# Patient Record
Sex: Male | Born: 1950
Health system: Southern US, Community
[De-identification: ages and names within clinical notes are randomized; demographics above are authoritative.]

## PROBLEM LIST (undated history)

## (undated) DIAGNOSIS — N200 Calculus of kidney: Secondary | ICD-10-CM

## (undated) DIAGNOSIS — E782 Mixed hyperlipidemia: Secondary | ICD-10-CM

## (undated) DIAGNOSIS — Z860100 Personal history of colon polyps, unspecified: Secondary | ICD-10-CM

## (undated) DIAGNOSIS — M545 Low back pain, unspecified: Secondary | ICD-10-CM

## (undated) DIAGNOSIS — I219 Acute myocardial infarction, unspecified: Secondary | ICD-10-CM

## (undated) DIAGNOSIS — I10 Essential (primary) hypertension: Secondary | ICD-10-CM

## (undated) DIAGNOSIS — R011 Cardiac murmur, unspecified: Secondary | ICD-10-CM

## (undated) DIAGNOSIS — I2510 Atherosclerotic heart disease of native coronary artery without angina pectoris: Secondary | ICD-10-CM

## (undated) DIAGNOSIS — E119 Type 2 diabetes mellitus without complications: Secondary | ICD-10-CM

## (undated) DIAGNOSIS — C8590 Non-Hodgkin lymphoma, unspecified, unspecified site: Secondary | ICD-10-CM

## (undated) DIAGNOSIS — Z87442 Personal history of urinary calculi: Secondary | ICD-10-CM

## (undated) DIAGNOSIS — M1990 Unspecified osteoarthritis, unspecified site: Secondary | ICD-10-CM

## (undated) HISTORY — DX: Mixed hyperlipidemia: E78.2

## (undated) HISTORY — PX: INGUINAL HERNIA REPAIR: SUR1180

## (undated) HISTORY — PX: CYSTOSTOMY W/ STENT INSERTION: SHX1434

## (undated) HISTORY — PX: CORONARY ANGIOPLASTY WITH STENT PLACEMENT: SHX49

## (undated) HISTORY — PX: CYSTOSCOPY W/ URETEROSCOPY W/ LITHOTRIPSY: SUR380

## (undated) HISTORY — DX: Low back pain, unspecified: M54.50

## (undated) HISTORY — DX: Personal history of colonic polyps: Z86.010

## (undated) HISTORY — PX: TONSILLECTOMY: SUR1361

## (undated) HISTORY — DX: Low back pain: M54.5

## (undated) HISTORY — DX: Personal history of colon polyps, unspecified: Z86.0100

## (undated) HISTORY — DX: Essential (primary) hypertension: I10

---

## 1990-05-22 HISTORY — PX: CORONARY ARTERY BYPASS GRAFT: SHX141

## 2000-05-22 DIAGNOSIS — C8590 Non-Hodgkin lymphoma, unspecified, unspecified site: Secondary | ICD-10-CM

## 2000-05-22 HISTORY — DX: Non-Hodgkin lymphoma, unspecified, unspecified site: C85.90

## 2000-08-07 ENCOUNTER — Encounter (INDEPENDENT_AMBULATORY_CARE_PROVIDER_SITE_OTHER): Payer: Self-pay | Admitting: *Deleted

## 2000-08-07 ENCOUNTER — Encounter (INDEPENDENT_AMBULATORY_CARE_PROVIDER_SITE_OTHER): Payer: Self-pay | Admitting: Specialist

## 2000-08-07 ENCOUNTER — Encounter: Payer: Self-pay | Admitting: General Surgery

## 2000-08-07 ENCOUNTER — Ambulatory Visit (HOSPITAL_COMMUNITY): Admission: RE | Admit: 2000-08-07 | Discharge: 2000-08-07 | Payer: Self-pay | Admitting: General Surgery

## 2000-08-09 ENCOUNTER — Encounter (INDEPENDENT_AMBULATORY_CARE_PROVIDER_SITE_OTHER): Payer: Self-pay | Admitting: *Deleted

## 2000-08-09 ENCOUNTER — Ambulatory Visit (HOSPITAL_COMMUNITY): Admission: RE | Admit: 2000-08-09 | Discharge: 2000-08-09 | Payer: Self-pay | Admitting: General Surgery

## 2000-08-09 ENCOUNTER — Encounter: Payer: Self-pay | Admitting: General Surgery

## 2000-08-10 ENCOUNTER — Encounter: Payer: Self-pay | Admitting: Cardiology

## 2000-08-10 ENCOUNTER — Ambulatory Visit (HOSPITAL_COMMUNITY): Admission: RE | Admit: 2000-08-10 | Discharge: 2000-08-10 | Payer: Self-pay | Admitting: Cardiology

## 2000-08-20 HISTORY — PX: EXPLORATORY LAPAROTOMY WITH ABDOMINAL MASS EXCISION: SHX5169

## 2000-08-20 HISTORY — PX: CHOLECYSTECTOMY: SHX55

## 2000-09-10 ENCOUNTER — Encounter (INDEPENDENT_AMBULATORY_CARE_PROVIDER_SITE_OTHER): Payer: Self-pay | Admitting: *Deleted

## 2000-09-10 ENCOUNTER — Encounter (INDEPENDENT_AMBULATORY_CARE_PROVIDER_SITE_OTHER): Payer: Self-pay | Admitting: Specialist

## 2000-09-10 ENCOUNTER — Encounter: Payer: Self-pay | Admitting: General Surgery

## 2000-09-10 ENCOUNTER — Inpatient Hospital Stay (HOSPITAL_COMMUNITY): Admission: RE | Admit: 2000-09-10 | Discharge: 2000-09-14 | Payer: Self-pay | Admitting: General Surgery

## 2000-09-13 ENCOUNTER — Encounter (INDEPENDENT_AMBULATORY_CARE_PROVIDER_SITE_OTHER): Payer: Self-pay | Admitting: *Deleted

## 2000-09-13 ENCOUNTER — Encounter: Payer: Self-pay | Admitting: Oncology

## 2004-08-30 ENCOUNTER — Ambulatory Visit: Payer: Self-pay | Admitting: Cardiology

## 2004-09-01 ENCOUNTER — Ambulatory Visit: Payer: Self-pay | Admitting: Cardiology

## 2004-09-08 ENCOUNTER — Ambulatory Visit: Payer: Self-pay | Admitting: Cardiology

## 2004-09-29 ENCOUNTER — Ambulatory Visit: Payer: Self-pay | Admitting: Cardiology

## 2004-10-11 ENCOUNTER — Ambulatory Visit: Payer: Self-pay | Admitting: Cardiology

## 2004-11-28 ENCOUNTER — Ambulatory Visit: Payer: Self-pay | Admitting: Cardiology

## 2005-09-22 ENCOUNTER — Ambulatory Visit: Payer: Self-pay | Admitting: Cardiology

## 2005-10-03 ENCOUNTER — Ambulatory Visit: Payer: Self-pay | Admitting: Cardiology

## 2010-06-13 ENCOUNTER — Ambulatory Visit
Admission: RE | Admit: 2010-06-13 | Discharge: 2010-06-13 | Payer: Self-pay | Source: Home / Self Care | Attending: Internal Medicine | Admitting: Internal Medicine

## 2010-06-16 ENCOUNTER — Encounter: Payer: Self-pay | Admitting: Internal Medicine

## 2010-06-23 NOTE — Letter (Signed)
Summary: New Patient letter  [facility]  [address] [place], [place] [address]   Phone: [phone]  Fax: [phone]       06/16/2010 MRN: [id number]  Ryan Vaughn [address] [place], [place]  [address]  Dear Mr. SWEEDEN,  Welcome to the Gastroenterology Division at [facility].    You are scheduled to see Dr.  Marina Goodell on 07-22-10 at 1:15pm on the 3rd floor at [facility], [address]  We ask that you try to arrive at our office 15 minutes prior to your appointment time to allow for check-in.  We would like you to complete the enclosed self-administered evaluation form prior to your visit and bring it with you on the day of your appointment.  We will review it with you.  Also, please bring a complete list of all your medications or, if you prefer, bring the medication bottles and we will list them.  Please bring your insurance card so that we may make a copy of it.  If your insurance requires a referral to see a specialist, please bring your referral form from your primary care physician.  Co-payments are due at the time of your visit and may be paid by cash, check or credit card.     Your office visit will consist of a consult with your physician (includes a physical exam), any laboratory testing he/she may order, scheduling of any necessary diagnostic testing (e.g. x-ray, ultrasound, CT-scan), and scheduling of a procedure (e.g. Endoscopy, Colonoscopy) if required.  Please allow enough time on your schedule to allow for any/all of these possibilities.    If you cannot keep your appointment, please call [phone] to cancel or reschedule prior to your appointment date.  This allows [place] the opportunity to schedule an appointment for another patient in need of care.  If you do not cancel or reschedule by 5 p.m. the business day prior to your appointment date, you will be charged a $50.00 late cancellation/no-show fee.    Thank you for choosing [facility] for your medical needs.  We appreciate the opportunity to care for you.  Please visit [place] at our website  to learn more about our practice.                     Sincerely,                                                             The Gastroenterology Division

## 2010-06-27 ENCOUNTER — Other Ambulatory Visit: Payer: 59 | Admitting: Internal Medicine

## 2010-07-22 ENCOUNTER — Ambulatory Visit: Payer: Self-pay | Admitting: Internal Medicine

## 2010-07-28 NOTE — Op Note (Signed)
Summary: Operative Report                    [place]. [facility]  Patient:    Ryan Vaughn, Ryan Vaughn                       MRN: [id number] Proc. Date: 09/10/00 Adm. Date:  [id number] Attending:  Brandy Hale CC:         Marcelino Duster, M.D., [place], [facility]  Julieanne Manson, M.D.   Operative Report  PREOPERATIVE DIAGNOSIS:  Abdominal mass, suspected lymphoma, gallstones.  POSTOPERATIVE DIAGNOSES: 1. Lymphoproliferative disorder of small bowel mesentery. 2. Suspect lymphoma. 3. Gallstones.  OPERATIONS PERFORMED: 1. Diagnostic laparoscopy converted to open laparotomy. 2. Excisional biopsy of mesenteric mass. 3. Frozen section. 4. Cholecystectomy.  SURGEON:  Claud Kelp, M.D.  ASSISTANT:  Ollen Gross. Vernell Morgans, M.D.  ANESTHESIA:  General endotracheal.  OPERATIVE INDICATIONS:  This is a 60 year old white man who was recently elevated for hematuria and back pain.  A CAT scan showed some calcified gallstones and a 9 cm enhancing mass in the left upper abdomen within the small bowel mesentery.  This was separate from the pancreas and the stomach, and the colon; and, involved the superior mesenteric artery.  He was also found to recently have a right ureteral kidney stone and has recently had a stent placed, and lithotripsy and the stent removed; and, the hematuria has resolved.  He underwent CT guided core biopsy of his abdominal mass on August 07, 2000 and this showed atypical lymphoid cells suspicious of lymphoma, but the diagnosis could not be confirmed.  The patient was advised to have surgery for diagnosis of his abdominal mass, despite the absence of symptoms and felt that cholecystectomy would be reasonable if the surgery went well.  OPERATIVE FINDINGS:   The patient had a 10 cm mass involving the very proximal small bowel mesentery.  This mass was grapefruit size and extended all the way back into the root of the mesentery around the superior mesenteric  artery. The mass did not invade the small bowel itself, but filled the mesentery in this area.  There was a 3 cm nodule adjacent to this, which was amenable to biopsy.  There was no other adenopathy in the small or large intestinal mesentery.  Celiac and periaortic areas felt normal.  Gallbladder was distended, but not acutely inflamed.  The liver looked and felt normal.  The stomach and duodenum looked normal.  The rest of the small bowel and large bowel contained no palpable masses.  The rectum and pelvis felt normal.  There was no ascites.  The omentum looked normal too.  OPERATIVE TECHNIQUE:  Following the induction of general endotracheal anesthesia a Foley catheter was inserted.  The abdomen was prepped and draped in a sterile fashion.  A short vertical incision was made just below the umbilicus.  The fascia was incised in the midline and the abdominal cavity entered under direct vision.  A 10 mm Hasson trocar was inserted and secured with a purse-string suture of 0-Vicryl.  Pneumoperitoneum was created.  Video camera was inserted with visualization and findings as described above.  We put a 5 mm trocar in the upper midline and explored the abdomen, moving the small and large intestines around.  This large mass was visible, but could not adequately mobilize it and it looked quite vascular, and we did not feel that we could control bleeding laparoscopically.  For this  reason we released the pneumoperitoneum and removed the trocars.  An upper midline laparotomy was performed, and the abdomen entered and further explored with findings as described above.  We mobilized the mass in the left upper quadrant and found a small 2.5 cm nodule in the small bowel mesentery adjacent to the larger mass.  We dissected this out the mesentery, isolating, clamping, and dividing feeding vessels. We ligated these feeding vessels with 2-0 silk ties.  We ultimately removed the entire mass and sent it to  pathology.  Dr. Laureen Ochs did a frozen section and felt that this was most likely a lymphoma, and that he would send it for further characterization.  Hemostasis was excellent in the small bowel mesentery.  The small bowel was pink and viable, and we did not devascularize this area at all.  We closed the mesentery with interrupted sutures of 2-0 silk.  We then turned our attention to the right upper quadrant.  The gallbladder was distended and the bile was evacuated with a needle and syringe.  We elevated the gallbladder after exposure was gained.  We dissected the gallbladder from its fundus downward, dissecting with scissors and cautery dissection until we had the gallbladder dissected completely away from the liver bed.  We then isolated the cystic artery, secured it with metal clips and divided it.  The gallbladder was then attached only by the cystic duct, which was easily visible.   The cystic duct was secured with metal clips and divided.  The gallbladder was removed.  The operative field in the right upper quadrant was copiously irrigated with saline.  Hemostasis was excellent and achieved with electrocautery.  At the completion of the case there was no bleeding and no bile leak whatsoever in the right upper quadrant and there was no bleeding from the small bowel mesentery.  We irrigated the abdomen one more time and returned the GI tract and the omentum to their anatomic positions.  Midline fascia was closed with a running suture of #1 PDS and the skin closed with skin staples.  Clean bandage was placed and the patient taken to the recovery room in stable condition.  ESTIMATED BLOOD LOSS:  About 100-150 cc.  COMPLICATIONS:  None.  Sponge, needle and instrument counts were correct. DD:  09/10/00 TD:  09/11/00 Job: [id number]

## 2010-07-28 NOTE — Discharge Summary (Signed)
Summary: Discharge Summary                    [place]. [facility]  Patient:    Ryan Vaughn, Ryan Vaughn                       MRN: [id number] Adm. Date:  [id number] Disc. Date: [id number] Attending:  Brandy Hale CC:         Marcelino Duster, M.D., [place], [place].  Julieanne Manson, M.D.  Pierce Crane, M.D.   Discharge Summary  FINAL DIAGNOSES: 1. 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 2. Chronic cholecystitis with cholelithiasis.  PROCEDURES: 1. Diagnostic laparoscopy converted to open laparotomy, excisional biopsy    of mesenteric mass, cholecystectomy September 10, 2000. 2. Bone marrow biopsy and aspiration, September 14, 2000.  HISTORY OF PRESENT ILLNESS:  This is a 60 year old white man who underwent recent evaluation for hematuria and back pain.  A CT scan showed calcified gallstones and a 9 cm mass in the left upper abdomen within the small bowel mesentery.  This was thought to be separate from the pancreas and stomach, possibly involving the superior mesenteric artery.  He also had a right urethral kidney stone which was treated with lithotripsy.  He underwent CT-guided core biopsy of his abdominal mass on August 07, 2000 and this showed atypical lymphoid cells suspicious but not diagnostic of lymphoma. The patient had no peripheral adenopathy.  He was advised to have surgery for diagnosis of his abdominal mass.  Gallstones were also noted and cholecystectomy was advised if his other surgery went well.  He was brought to the operating room electively and the hospital electively.  PHYSICAL EXAMINATION:  GENERAL:  Healthy, middle-aged gentleman in no distress.  HEENT:  Sclerae clear.  NECK:  No adenopathy and no mass.  LUNGS:  Clear.  HEART:  Regular rate and rhythm.  No murmur.  Well-healed sternotomy scar from previous coronary artery bypass grafting.  ABDOMEN:  Soft and nontender.  No palpable mass.  GENITALIA:  No inguinal adenopathy.  No testicular  mass.  HOSPITAL COURSE:  On the day of admission, the patient was taken to the operating room.  He underwent diagnostic laparoscopy.  We could see a large mass in the small bowel mesentery but it was felt to be very vascular and there were no smaller nodes that could be biopsied laparoscopically.  We saw no other abnormality of the liver, spleen, or omentum.  Converted to open laparotomy and we excised a 2 cm diameter nodule in the small bowel mesentery near the larger nodule.  We thoroughly explored the rest of the abdomen and found no other abnormalities.  Cholecystectomy was performed.  Postoperatively, the patient did fairly well.  He had some low grade fever in the early postoperative secondary to atelectasis but this resolved with mobilization and pulmonary toilet.  Pathology report showed a follicular lymphoma and chronic cholecystitis with cholelithiasis.  I asked Dr. Pierce Crane to see the patient and he did see the patient on September 13, 2000.  He advised the patient to undergo bone marrow biopsy and that was done on September 14, 2000 without any problems.  The patient was told that he would probably need combination of chemotherapy at least six cycles.  A discussion over the follow-up was made and Dr. Pierce Crane felt this patient could be followed in [place], [place] where he is from.  The patient progressed in his diet and activities  uneventfully and was discharged on September 14, 2000 after his bone marrow biopsy was done.  At the time of discharge, he was afebrile.  His wound was healing uneventfully.  He was tolerating a diet and had one bowel movement and passing lots of flatus. His exam was unremarkable.  DISCHARGE INSTRUCTIONS:  He was asked to follow up with me in the office in four to six days for staple removal.  He was given a prescription for Vicodin. DD:  10/01/00 TD:  10/02/00 Job: [id number]

## 2010-07-28 NOTE — Consult Note (Signed)
Summary: Consultation                    [place]. [facility]  Patient:    IMRAN, NUON                         MRN: [id number] Proc. Date: 09/13/00 Attending:  Pierce Crane, M.D. Dictator:   Lorette Ang, N.P. CC:         Marcelino Duster, M.D., [place], [place]  Julieanne Manson, M.D.  Angelia Mould. Derrell Lolling, M.D.  [facility], [place], [place]   Consultation Report  REASON FOR CONSULTATION:  New diagnosis of lymphoma.  REFERRING:  Angelia Mould. Derrell Lolling, M.D.  HISTORY OF PRESENT ILLNESS:  Mr. Whetstine is a 60 year old man with a history of CAD, status post CABG approximately 10 years ago and a PTCA with stent placement approximately four years ago, who developed some low-back pain and hematuria in late February 2002.  He was sent for an abdominal CT at [facility] by his primary care provider with findings of calculus involving the proximal left ureter, as well as findings of gallstones and a large soft tissue mass in the base of the mesentery measuring 6 x 8 cm.  Prior to further evaluation of this mass, Mr. Enberg required admission to [facility] with severe back pain, nausea, and vomiting.  He underwent a left ureteral stent placement, lithotripsy, and stent removal with resolution of his symptoms.  He was referred to Dr. Claud Kelp in [place] for evaluation of the abdominal mass and underwent a CT-guided biopsy of the mass on August 07, 2000.  The pathology report was indeterminate.  On September 08, 2000, he underwent open laparotomy with excisional biopsy of the mass and a cholecystectomy.  The final pathology ([id number]) revealed a large cell follicular lymphadenopathy; gallbladder with chronic cholecystitis and cholelithiasis.  PAST MEDICAL HISTORY: 1. CAD status post CABG, PTCA with stent placement [facility]). 2. Kidney stone, left ureteral stent placement, lithotripsy, stent removal in    March 2002. 3. Hypertension. 4. Hemorrhoids. 5.  Gallstones.  MEDICATIONS: 1. Aspirin 81 mg daily. 2. Tenormin 25 mg daily. 3. Protonix 40 mg daily. 4. Buprenex p.r.n. 5. Percocet p.r.n.  HOME MEDICATIONS: 1. Lipitor 40 mg daily. 2. Atenolol 25 mg daily. 3. Aspirin 81 mg daily. 4. Multivitamin.  ALLERGIES:  He has had a reaction in the past to MRI DYE.  FAMILY HISTORY:  Mother is in her 38s and has a history of breast cancer with a questionable brain metastasis; father is 32 with colon cancer and newly diagnosed liver metastasis; brother living, disabled in a mining accident; sister living, healthy; sister living, healthy.  SOCIAL HISTORY:  Mr. Hilleary lives in [place] by himself.  He is separated.  He has three daughters ages 55 and twins age 77, who are all healthy.  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  He has no history of tobacco or ETOH use.  REVIEW OF SYSTEMS:  The patient denies any weight loss or anorexia.  He has had no fever or night sweats.  He denies any fatigue.  He has had no unusual headaches or vision changes.  He denies any hearing deficits.  He has noted no enlarged lymph nodes.  He denies any shortness of breath but does report a cough for the past week.  He has had no hemoptysis.  He denies any chest pain or edema.  He has had no  recent change in his bowel habits and denies any rectal bleeding or melena.  He has had no hematuria since his lithotripsy and denies any dysuria.  He denies any falls or balance problems at home.  PHYSICAL EXAMINATION:  VITAL SIGNS:  Temperature 97.6, heart rate 72, respirations 16, blood pressure 115/67.  GENERAL:  Well-developed male in no acute distress.  HEENT:  Normocephalic, atraumatic.  Pupils are equal, round and reactive to light; extraocular movements are intact; sclerae are anicteric.  Oropharynx is clear.  NECK:  Supple.  LYMPHATIC:  No palpable lymph nodes.  LUNGS:  Diminished at the bilaterally; clear.  CARDIOVASCULAR:  Regular rate and  rhythm.  ABDOMEN:  Soft, tenderness secondary to recent surgery.  Midline incision with staples.  EXTREMITIES:  No edema or clubbing.  NEUROLOGIC:  Alert and oriented x 3.  Motor strength is 5/5.  DTRs are 2+ and symmetrical.  LABORATORY DATA:  Hemoglobin 15.4, white count 4.4, platelets 231,000.  Sodium 141, potassium 3.7, BUN 8, creatinine 1.1, glucose 97, total bilirubin 1.1, alkaline phosphatase 72, SGOT 28, SGPT 30, total protein 7.1, albumin 3.7, calcium 9.4, and LDH 152.  RADIOLOGY: 1. Chest x-ray September 10, 2000:  Borderline cardiac enlargement.  No active    disease. 2. CT of the abdomen September 10, 2000:  Liver negative; spleen negative.  Large,    enhancing mass involving the upper abdomen 9 x 7 cm.  Appears to be based    in the region of the left mesenteric space.  Gallbladder with gallstones. 3. Nuclear medicine Cardiolite:  Left ventricular ejection fraction 65%    (August 10, 2000).  IMPRESSION AND PLAN:  Mr. Domangue is a 60 year old man with an incidental finding of an abdominal mass during a workup for kidney stones.  He is now status post open laparotomy with biopsy confirming a large cell follicular lymphoma; CD20 positive.  He has had no B symptoms.  He will need a full staging workup including a CT scan of the chest, PET scan, and a bone marrow biopsy.  We will likely treat him with a combination CHOP/Rituxan chemotherapy for six cycles.  Mr. Wessinger would like to have follow-up at the [facility] clinic and we have scheduled him to see Dr. Donnie Coffin on Oct 01, 2000 at 4:30 p.m.  We will also schedule him for his PET scan. DD:  09/14/00 TD:  09/15/00 Job: [id number]

## 2010-10-07 NOTE — Procedures (Signed)
[place]. [facility]  Patient:    PARAS, KREIDER                       MRN: [id number] Proc. Date: 09/10/00 Adm. Date:  [id number] Attending:  Brandy Hale                           Procedure Report  PROCEDURE:  Epidural catheter placement for postoperative epidural fentanyl pain control.  DESCRIPTION OF PROCEDURE:  The procedure was not discussed with the patient preoperatively; however, the procedure was changed intraoperatively to an open cholecystectomy due to a large abdominal mass suggestive of lymphoma.  The surgeons felt that the patient would benefit from postoperative epidural fentanyl pain control and in consultation with his primary surgeon, Angelia Mould. Derrell Lolling, M.D., it was elected to place an epidural catheter while the patient was still asleep for postoperative epidural fentanyl and Marcaine pain control.  After the surgical dressing was applied, patient was turned to the full right lateral decubitus position.  Betadine prep x 3 was performed on the lumbar region.  Sterile technique was used.  A #17 gauge Tuohy needle was inserted in the L3-4 interspace using a midline approach and air loss of resistance.  The epidural space was located on the first pass.  There was no blood or CSF noted.  A nonstyletted catheter was then placed 6 cm into the epidural space, and the needle was removed.  There was negative aspiration of blood or CSF through the catheter.  A mixture of preservative-free Xylocaine 1% 5 cc, 0.9 normal saline 10 cc, and 100 mcg of fentanyl was then slowly injected into the epidural catheter.  The catheter was secured to the patients back using four-inch Hypafix dressing over a 2 x 2 sterile gauze at the insertion site.  The patient was then awoken, extubated, taken to the recovery room in good condition, where he was placed on a continuous infusion of 5 mcg/cc epidural fentanyl and one-sixteenth percent Marcaine, initial  rate of 16 cc/hr.  He will be evaluated for the next two to three days for adequacy of pain control.  There were no complications during the procedure. DD:  09/10/00 TD:  09/11/00 Job: [id number]

## 2010-10-07 NOTE — Consult Note (Signed)
[place]. [facility]  Patient:    Ryan Vaughn, Ryan Vaughn                         MRN: [id number] Proc. Date: 09/13/00 Attending:  Pierce Crane, M.D. Dictator:   Lorette Ang, N.P. CC:         Marcelino Duster, M.D., [place], [place]  Julieanne Manson, M.D.  Angelia Mould. Derrell Lolling, M.D.  [facility], [place], [place]   Consultation Report  REASON FOR CONSULTATION:  New diagnosis of lymphoma.  REFERRING:  Angelia Mould. Derrell Lolling, M.D.  HISTORY OF PRESENT ILLNESS:  Mr. Eick is a 60 year old man with a history of CAD, status post CABG approximately 10 years ago and a PTCA with stent placement approximately four years ago, who developed some low-back pain and hematuria in late February 2002.  He was sent for an abdominal CT at [facility] by his primary care Donta Fuster with findings of calculus involving the proximal left ureter, as well as findings of gallstones and a large soft tissue mass in the base of the mesentery measuring 6 x 8 cm.  Prior to further evaluation of this mass, Mr. Urieta required admission to [facility] with severe back pain, nausea, and vomiting.  He underwent a left ureteral stent placement, lithotripsy, and stent removal with resolution of his symptoms.  He was referred to Dr. Claud Kelp in [place] for evaluation of the abdominal mass and underwent a CT-guided biopsy of the mass on August 07, 2000.  The pathology report was indeterminate.  On September 08, 2000, he underwent open laparotomy with excisional biopsy of the mass and a cholecystectomy.  The final pathology ([id number]) revealed a large cell follicular lymphadenopathy; gallbladder with chronic cholecystitis and cholelithiasis.  PAST MEDICAL HISTORY: 1. CAD status post CABG, PTCA with stent placement [facility]). 2. Kidney stone, left ureteral stent placement, lithotripsy, stent removal in    March 2002. 3. Hypertension. 4. Hemorrhoids. 5.  Gallstones.  MEDICATIONS: 1. Aspirin 81 mg daily. 2. Tenormin 25 mg daily. 3. Protonix 40 mg daily. 4. Buprenex p.r.n. 5. Percocet p.r.n.  HOME MEDICATIONS: 1. Lipitor 40 mg daily. 2. Atenolol 25 mg daily. 3. Aspirin 81 mg daily. 4. Multivitamin.  ALLERGIES:  He has had a reaction in the past to MRI DYE.  FAMILY HISTORY:  Mother is in her 26s and has a history of breast cancer with a questionable brain metastasis; father is 106 with colon cancer and newly diagnosed liver metastasis; brother living, disabled in a mining accident; sister living, healthy; sister living, healthy.  SOCIAL HISTORY:  Mr. Pooley lives in [place] by himself.  He is separated.  He has three daughters ages 50 and twins age 68, who are all healthy.  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  He has no history of tobacco or ETOH use.  REVIEW OF SYSTEMS:  The patient denies any weight loss or anorexia.  He has had no fever or night sweats.  He denies any fatigue.  He has had no unusual headaches or vision changes.  He denies any hearing deficits.  He has noted no enlarged lymph nodes.  He denies any shortness of breath but does report a cough for the past week.  He has had no hemoptysis.  He denies any chest pain or edema.  He has had no recent change in his bowel habits and denies any rectal bleeding or melena.  He has had no hematuria since his  lithotripsy and denies any dysuria.  He denies any falls or balance problems at home.  PHYSICAL EXAMINATION:  VITAL SIGNS:  Temperature 97.6, heart rate 72, respirations 16, blood pressure 115/67.  GENERAL:  Well-developed male in no acute distress.  HEENT:  Normocephalic, atraumatic.  Pupils are equal, round and reactive to light; extraocular movements are intact; sclerae are anicteric.  Oropharynx is clear.  NECK:  Supple.  LYMPHATIC:  No palpable lymph nodes.  LUNGS:  Diminished at the bilaterally; clear.  CARDIOVASCULAR:  Regular rate and  rhythm.  ABDOMEN:  Soft, tenderness secondary to recent surgery.  Midline incision with staples.  EXTREMITIES:  No edema or clubbing.  NEUROLOGIC:  Alert and oriented x 3.  Motor strength is 5/5.  DTRs are 2+ and symmetrical.  LABORATORY DATA:  Hemoglobin 15.4, white count 4.4, platelets 231,000.  Sodium 141, potassium 3.7, BUN 8, creatinine 1.1, glucose 97, total bilirubin 1.1, alkaline phosphatase 72, SGOT 28, SGPT 30, total protein 7.1, albumin 3.7, calcium 9.4, and LDH 152.  RADIOLOGY: 1. Chest x-ray September 10, 2000:  Borderline cardiac enlargement.  No active    disease. 2. CT of the abdomen September 10, 2000:  Liver negative; spleen negative.  Large,    enhancing mass involving the upper abdomen 9 x 7 cm.  Appears to be based    in the region of the left mesenteric space.  Gallbladder with gallstones. 3. Nuclear medicine Cardiolite:  Left ventricular ejection fraction 65%    (August 10, 2000).  IMPRESSION AND PLAN:  Mr. Clinkscale is a 60 year old man with an incidental finding of an abdominal mass during a workup for kidney stones.  He is now status post open laparotomy with biopsy confirming a large cell follicular lymphoma; CD20 positive.  He has had no B symptoms.  He will need a full staging workup including a CT scan of the chest, PET scan, and a bone marrow biopsy.  We will likely treat him with a combination CHOP/Rituxan chemotherapy for six cycles.  Mr. Bright would like to have follow-up at the [facility] clinic and we have scheduled him to see Dr. Donnie Coffin on Oct 01, 2000 at 4:30 p.m.  We will also schedule him for his PET scan. DD:  09/14/00 TD:  09/15/00 Job: [id number]

## 2010-10-07 NOTE — Discharge Summary (Signed)
[place]. [facility]  Patient:    Ryan Vaughn, Ryan Vaughn                       MRN: [id number] Adm. Date:  [id number] Disc. Date: [id number] Attending:  Brandy Hale CC:         Marcelino Duster, M.D., [place], [place].  Julieanne Manson, M.D.  Pierce Crane, M.D.   Discharge Summary  FINAL DIAGNOSES: 1. 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 2. Chronic cholecystitis with cholelithiasis.  PROCEDURES: 1. Diagnostic laparoscopy converted to open laparotomy, excisional biopsy    of mesenteric mass, cholecystectomy September 10, 2000. 2. Bone marrow biopsy and aspiration, September 14, 2000.  HISTORY OF PRESENT ILLNESS:  This is a 60 year old white man who underwent recent evaluation for hematuria and back pain.  A CT scan showed calcified gallstones and a 9 cm mass in the left upper abdomen within the small bowel mesentery.  This was thought to be separate from the pancreas and stomach, possibly involving the superior mesenteric artery.  He also had a right urethral kidney stone which was treated with lithotripsy.  He underwent CT-guided core biopsy of his abdominal mass on August 07, 2000 and this showed atypical lymphoid cells suspicious but not diagnostic of lymphoma. The patient had no peripheral adenopathy.  He was advised to have surgery for diagnosis of his abdominal mass.  Gallstones were also noted and cholecystectomy was advised if his other surgery went well.  He was brought to the operating room electively and the hospital electively.  PHYSICAL EXAMINATION:  GENERAL:  Healthy, middle-aged gentleman in no distress.  HEENT:  Sclerae clear.  NECK:  No adenopathy and no mass.  LUNGS:  Clear.  HEART:  Regular rate and rhythm.  No murmur.  Well-healed sternotomy scar from previous coronary artery bypass grafting.  ABDOMEN:  Soft and nontender.  No palpable mass.  GENITALIA:  No inguinal adenopathy.  No testicular mass.  HOSPITAL COURSE:  On  the day of admission, the patient was taken to the operating room.  He underwent diagnostic laparoscopy.  We could see a large mass in the small bowel mesentery but it was felt to be very vascular and there were no smaller nodes that could be biopsied laparoscopically.  We saw no other abnormality of the liver, spleen, or omentum.  Converted to open laparotomy and we excised a 2 cm diameter nodule in the small bowel mesentery near the larger nodule.  We thoroughly explored the rest of the abdomen and found no other abnormalities.  Cholecystectomy was performed.  Postoperatively, the patient did fairly well.  He had some low grade fever in the early postoperative secondary to atelectasis but this resolved with mobilization and pulmonary toilet.  Pathology report showed a follicular lymphoma and chronic cholecystitis with cholelithiasis.  I asked Dr. Pierce Crane to see the patient and he did see the patient on September 13, 2000.  He advised the patient to undergo bone marrow biopsy and that was done on September 14, 2000 without any problems.  The patient was told that he would probably need combination of chemotherapy at least six cycles.  A discussion over the follow-up was made and Dr. Pierce Crane felt this patient could be followed in [place], [place] where he is from.  The patient progressed in his diet and activities uneventfully and was discharged on September 14, 2000 after his bone marrow biopsy was done.  At the time of discharge, he  was afebrile.  His wound was healing uneventfully.  He was tolerating a diet and had one bowel movement and passing lots of flatus. His exam was unremarkable.  DISCHARGE INSTRUCTIONS:  He was asked to follow up with me in the office in four to six days for staple removal.  He was given a prescription for Vicodin. DD:  10/01/00 TD:  10/02/00 Job: [id number]

## 2010-10-07 NOTE — Op Note (Signed)
[place]. [facility]  Patient:    Ryan Vaughn, Ryan Vaughn                       MRN: [id number] Proc. Date: 09/10/00 Adm. Date:  [id number] Attending:  Brandy Hale CC:         Marcelino Duster, M.D., [place], [facility]  Julieanne Manson, M.D.   Operative Report  PREOPERATIVE DIAGNOSIS:  Abdominal mass, suspected lymphoma, gallstones.  POSTOPERATIVE DIAGNOSES: 1. Lymphoproliferative disorder of small bowel mesentery. 2. Suspect lymphoma. 3. Gallstones.  OPERATIONS PERFORMED: 1. Diagnostic laparoscopy converted to open laparotomy. 2. Excisional biopsy of mesenteric mass. 3. Frozen section. 4. Cholecystectomy.  SURGEON:  Claud Kelp, M.D.  ASSISTANT:  Ollen Gross. Vernell Morgans, M.D.  ANESTHESIA:  General endotracheal.  OPERATIVE INDICATIONS:  This is a 60 year old white man who was recently elevated for hematuria and back pain.  A CAT scan showed some calcified gallstones and a 9 cm enhancing mass in the left upper abdomen within the small bowel mesentery.  This was separate from the pancreas and the stomach, and the colon; and, involved the superior mesenteric artery.  He was also found to recently have a right ureteral kidney stone and has recently had a stent placed, and lithotripsy and the stent removed; and, the hematuria has resolved.  He underwent CT guided core biopsy of his abdominal mass on August 07, 2000 and this showed atypical lymphoid cells suspicious of lymphoma, but the diagnosis could not be confirmed.  The patient was advised to have surgery for diagnosis of his abdominal mass, despite the absence of symptoms and felt that cholecystectomy would be reasonable if the surgery went well.  OPERATIVE FINDINGS:   The patient had a 10 cm mass involving the very proximal small bowel mesentery.  This mass was grapefruit size and extended all the way back into the root of the mesentery around the superior mesenteric artery. The mass did not  invade the small bowel itself, but filled the mesentery in this area.  There was a 3 cm nodule adjacent to this, which was amenable to biopsy.  There was no other adenopathy in the small or large intestinal mesentery.  Celiac and periaortic areas felt normal.  Gallbladder was distended, but not acutely inflamed.  The liver looked and felt normal.  The stomach and duodenum looked normal.  The rest of the small bowel and large bowel contained no palpable masses.  The rectum and pelvis felt normal.  There was no ascites.  The omentum looked normal too.  OPERATIVE TECHNIQUE:  Following the induction of general endotracheal anesthesia a Foley catheter was inserted.  The abdomen was prepped and draped in a sterile fashion.  A short vertical incision was made just below the umbilicus.  The fascia was incised in the midline and the abdominal cavity entered under direct vision.  A 10 mm Hasson trocar was inserted and secured with a purse-string suture of 0-Vicryl.  Pneumoperitoneum was created.  Video camera was inserted with visualization and findings as described above.  We put a 5 mm trocar in the upper midline and explored the abdomen, moving the small and large intestines around.  This large mass was visible, but could not adequately mobilize it and it looked quite vascular, and we did not feel that we could control bleeding laparoscopically.  For this reason we released the pneumoperitoneum and removed the trocars.  An upper midline laparotomy was performed, and the abdomen entered and further  explored with findings as described above.  We mobilized the mass in the left upper quadrant and found a small 2.5 cm nodule in the small bowel mesentery adjacent to the larger mass.  We dissected this out the mesentery, isolating, clamping, and dividing feeding vessels. We ligated these feeding vessels with 2-0 silk ties.  We ultimately removed the entire mass and sent it to pathology.  Dr. Laureen Ochs did  a frozen section and felt that this was most likely a lymphoma, and that he would send it for further characterization.  Hemostasis was excellent in the small bowel mesentery.  The small bowel was pink and viable, and we did not devascularize this area at all.  We closed the mesentery with interrupted sutures of 2-0 silk.  We then turned our attention to the right upper quadrant.  The gallbladder was distended and the bile was evacuated with a needle and syringe.  We elevated the gallbladder after exposure was gained.  We dissected the gallbladder from its fundus downward, dissecting with scissors and cautery dissection until we had the gallbladder dissected completely away from the liver bed.  We then isolated the cystic artery, secured it with metal clips and divided it.  The gallbladder was then attached only by the cystic duct, which was easily visible.   The cystic duct was secured with metal clips and divided.  The gallbladder was removed.  The operative field in the right upper quadrant was copiously irrigated with saline.  Hemostasis was excellent and achieved with electrocautery.  At the completion of the case there was no bleeding and no bile leak whatsoever in the right upper quadrant and there was no bleeding from the small bowel mesentery.  We irrigated the abdomen one more time and returned the GI tract and the omentum to their anatomic positions.  Midline fascia was closed with a running suture of #1 PDS and the skin closed with skin staples.  Clean bandage was placed and the patient taken to the recovery room in stable condition.  ESTIMATED BLOOD LOSS:  About 100-150 cc.  COMPLICATIONS:  None.  Sponge, needle and instrument counts were correct. DD:  09/10/00 TD:  09/11/00 Job: [id number]

## 2011-04-19 ENCOUNTER — Ambulatory Visit: Payer: Self-pay

## 2011-04-19 ENCOUNTER — Other Ambulatory Visit: Payer: Self-pay | Admitting: Occupational Medicine

## 2011-04-19 DIAGNOSIS — Z021 Encounter for pre-employment examination: Secondary | ICD-10-CM

## 2012-03-29 ENCOUNTER — Encounter (INDEPENDENT_AMBULATORY_CARE_PROVIDER_SITE_OTHER): Payer: 59

## 2012-03-29 DIAGNOSIS — I2510 Atherosclerotic heart disease of native coronary artery without angina pectoris: Secondary | ICD-10-CM

## 2012-03-29 DIAGNOSIS — Z951 Presence of aortocoronary bypass graft: Secondary | ICD-10-CM

## 2012-03-29 DIAGNOSIS — E785 Hyperlipidemia, unspecified: Secondary | ICD-10-CM

## 2012-03-29 DIAGNOSIS — Z87898 Personal history of other specified conditions: Secondary | ICD-10-CM

## 2012-04-16 ENCOUNTER — Ambulatory Visit: Payer: Self-pay | Admitting: Cardiovascular Disease

## 2012-05-16 ENCOUNTER — Encounter: Payer: Self-pay | Admitting: Cardiology

## 2012-06-11 ENCOUNTER — Ambulatory Visit: Payer: Self-pay | Admitting: Cardiology

## 2012-06-18 ENCOUNTER — Ambulatory Visit: Payer: Self-pay | Admitting: Cardiovascular Disease

## 2013-01-26 ENCOUNTER — Encounter (HOSPITAL_COMMUNITY): Payer: Self-pay

## 2013-01-26 ENCOUNTER — Emergency Department (HOSPITAL_COMMUNITY): Payer: Worker's Compensation

## 2013-01-26 ENCOUNTER — Emergency Department (HOSPITAL_COMMUNITY)
Admission: EM | Admit: 2013-01-26 | Discharge: 2013-01-26 | Disposition: A | Discharge status: Home / Self Care | Payer: Worker's Compensation | Attending: Emergency Medicine | Admitting: Emergency Medicine

## 2013-01-26 DIAGNOSIS — Y929 Unspecified place or not applicable: Secondary | ICD-10-CM | POA: Insufficient documentation

## 2013-01-26 DIAGNOSIS — Y9389 Activity, other specified: Secondary | ICD-10-CM | POA: Insufficient documentation

## 2013-01-26 DIAGNOSIS — Z8719 Personal history of other diseases of the digestive system: Secondary | ICD-10-CM | POA: Insufficient documentation

## 2013-01-26 DIAGNOSIS — E785 Hyperlipidemia, unspecified: Secondary | ICD-10-CM | POA: Insufficient documentation

## 2013-01-26 DIAGNOSIS — I10 Essential (primary) hypertension: Secondary | ICD-10-CM | POA: Insufficient documentation

## 2013-01-26 DIAGNOSIS — W278XXA Contact with other nonpowered hand tool, initial encounter: Secondary | ICD-10-CM | POA: Insufficient documentation

## 2013-01-26 DIAGNOSIS — Z7982 Long term (current) use of aspirin: Secondary | ICD-10-CM | POA: Insufficient documentation

## 2013-01-26 DIAGNOSIS — Z79899 Other long term (current) drug therapy: Secondary | ICD-10-CM | POA: Insufficient documentation

## 2013-01-26 DIAGNOSIS — S60222A Contusion of left hand, initial encounter: Secondary | ICD-10-CM

## 2013-01-26 DIAGNOSIS — S60229A Contusion of unspecified hand, initial encounter: Secondary | ICD-10-CM | POA: Insufficient documentation

## 2013-01-26 DIAGNOSIS — Z8739 Personal history of other diseases of the musculoskeletal system and connective tissue: Secondary | ICD-10-CM | POA: Insufficient documentation

## 2013-01-26 DIAGNOSIS — Z8679 Personal history of other diseases of the circulatory system: Secondary | ICD-10-CM | POA: Insufficient documentation

## 2013-01-26 MED ORDER — IBUPROFEN 800 MG PO TABS: 800.0000 mg | ORAL_TABLET | Freq: Three times a day (TID) | ORAL | Status: DC | PRN

## 2013-01-26 MED ORDER — HYDROCODONE-ACETAMINOPHEN 5-325 MG PO TABS
1.0000 | ORAL_TABLET | Freq: Once | ORAL | Status: AC
  Administered 2013-01-26: 1 via ORAL
  Filled 2013-01-26: qty 1

## 2013-01-26 MED ORDER — HYDROCODONE-ACETAMINOPHEN 5-325 MG PO TABS: 1.0000 | ORAL_TABLET | ORAL | Status: DC | PRN

## 2013-01-26 NOTE — ED Notes (Signed)
He states he inadvertently struck his left hand while working at home this morning with a "4 pound hammer".  He has edema and ecchymosis at lat. Post. Left hand.  He is in no distress and has no open wound(s).

## 2013-01-26 NOTE — ED Provider Notes (Signed)
Medical screening examination/treatment/procedure(s) were performed by non-physician practitioner and as supervising physician I was immediately available for consultation/collaboration.  Toy Baker, MD 01/26/13 2035

## 2013-01-26 NOTE — ED Provider Notes (Signed)
CSN: [id number]     Arrival date & time 01/26/13  0920 History   First MD Initiated Contact with Patient 01/26/13 0935     Chief Complaint  Patient presents with  . Hand Pain   (Consider location/radiation/quality/duration/timing/severity/associated sxs/prior Treatment) HPI Comments: Right handed patient accidentally hit his left hand with a 4lb sledge hammer this morning.  Denies weakness, numbness, break in skin.  Denies other injury.   Patient is a 62 y.o. male presenting with hand pain. The history is provided by the patient.  Hand Pain Associated symptoms include joint swelling. Pertinent negatives include no numbness or weakness.    Past Medical History  Diagnosis Date  . Essential hypertension, benign   . Mixed hyperlipidemia   . Acute coronary occlusion without mycocardial infarction   . Personal history of colonic polyps   . Lumbago   . Routine general medical examination at a health care facility    No past surgical history on file. No family history on file. History  Substance Use Topics  . Smoking status: Not on file  . Smokeless tobacco: Not on file  . Alcohol Use: Not on file    Review of Systems  Musculoskeletal: Positive for joint swelling.  Skin: Negative for wound.  Neurological: Negative for weakness and numbness.    Allergies  Ivp dye  Home Medications   Current Outpatient Rx  Name  Route  Sig  Dispense  Refill  . aspirin 81 MG tablet   Oral   Take 81 mg by mouth daily.         . clopidogrel (PLAVIX) 75 MG tablet   Oral   Take 75 mg by mouth daily.         . cyclobenzaprine (FLEXERIL) 10 MG tablet   Oral   Take 10 mg by mouth 3 (three) times daily as needed.         . metoprolol succinate (TOPROL-XL) 50 MG 24 hr tablet   Oral   Take 50 mg by mouth daily. Take with or immediately following a meal.         . rosuvastatin (CRESTOR) 20 MG tablet   Oral   Take 20 mg by mouth daily.          BP 125/82  Pulse 67  Temp(Src)  98.3 F (36.8 C) (Oral)  Resp 18  SpO2 98% Physical Exam  Nursing note and vitals reviewed. Constitutional: He appears well-developed and well-nourished. No distress.  HENT:  Head: Normocephalic and atraumatic.  Neck: Neck supple.  Pulmonary/Chest: Effort normal.  Musculoskeletal:       Right wrist: He exhibits normal range of motion, no tenderness and no bony tenderness.       Left hand: He exhibits tenderness and swelling. He exhibits normal range of motion, normal capillary refill and no laceration.       Hands: Neurological: He is alert.  Skin: He is not diaphoretic.    ED Course  Procedures (including critical care time) Labs Review Labs Reviewed - No data to display Imaging Review Dg Hand Complete Left  01/26/2013   *RADIOLOGY REPORT*  Clinical Data: Hit hand with hammer, now with pain involving the thumb and second finger  LEFT HAND - COMPLETE 3+ VIEW  Comparison: None.  Findings:  The provided anterior projection radiograph is degraded due to the fingers being held in a minimal degree of flexion.  No fracture or dislocation.  Joint spaces are preserved.  No erosions.  Regional soft tissues are  normal.  No radiopaque foreign body.  IMPRESSION: No fracture or radiopaque foreign body.   Original Report Authenticated By: Tacey Ruiz, MD    MDM   1. Hand contusion, left, initial encounter    Pt accidentally hit left hand with 4lb "mini" sledge hammer this morning while at work.  Neurovascularly intact.  Xray shows no fracture.  Placed in ACE bandage, d/c home with RICE instructions, norco for pain. Discussed all results with patient.  Pt given return precautions.  Pt verbalizes understanding and agrees with plan.      Trixie Dredge, PA-C 01/26/13 1007

## 2013-01-26 NOTE — ED Notes (Signed)
Pt hit lt hand with a hammer this am and has pian,

## 2013-01-26 NOTE — ED Notes (Signed)
Correction--he states this occurred at his workplace this morning.

## 2013-01-31 ENCOUNTER — Ambulatory Visit
Admission: RE | Admit: 2013-01-31 | Discharge: 2013-01-31 | Disposition: A | Discharge status: Home / Self Care | Payer: Worker's Compensation | Source: Ambulatory Visit | Attending: Occupational Medicine | Admitting: Occupational Medicine

## 2013-01-31 ENCOUNTER — Other Ambulatory Visit: Payer: Self-pay | Admitting: Occupational Medicine

## 2013-01-31 DIAGNOSIS — S6722XA Crushing injury of left hand, initial encounter: Secondary | ICD-10-CM

## 2013-02-06 ENCOUNTER — Ambulatory Visit (INDEPENDENT_AMBULATORY_CARE_PROVIDER_SITE_OTHER): Payer: BC Managed Care – PPO | Admitting: Cardiovascular Disease

## 2013-02-06 ENCOUNTER — Encounter: Payer: Self-pay | Admitting: Cardiovascular Disease

## 2013-02-06 ENCOUNTER — Encounter: Payer: Self-pay | Admitting: *Deleted

## 2013-02-06 VITALS — BP 103/68 | HR 56 | Ht 71.0 in | Wt 233.0 lb

## 2013-02-06 DIAGNOSIS — K635 Polyp of colon: Secondary | ICD-10-CM | POA: Insufficient documentation

## 2013-02-06 DIAGNOSIS — I25810 Atherosclerosis of coronary artery bypass graft(s) without angina pectoris: Secondary | ICD-10-CM

## 2013-02-06 DIAGNOSIS — I10 Essential (primary) hypertension: Secondary | ICD-10-CM | POA: Insufficient documentation

## 2013-02-06 DIAGNOSIS — Z01810 Encounter for preprocedural cardiovascular examination: Secondary | ICD-10-CM

## 2013-02-06 DIAGNOSIS — E785 Hyperlipidemia, unspecified: Secondary | ICD-10-CM | POA: Insufficient documentation

## 2013-02-06 DIAGNOSIS — Z136 Encounter for screening for cardiovascular disorders: Secondary | ICD-10-CM

## 2013-02-06 DIAGNOSIS — Z951 Presence of aortocoronary bypass graft: Secondary | ICD-10-CM

## 2013-02-06 NOTE — Patient Instructions (Signed)
Your physician has requested that you have an echocardiogram. Echocardiography is a painless test that uses sound waves to create images of your heart. It provides your doctor with information about the size and shape of your heart and how well your heart's chambers and valves are working. This procedure takes approximately one hour. There are no restrictions for this procedure. Your physician has requested that you have a stress echocardiogram. For further information please visit [email or web address] Please follow instruction sheet as given. Office will contact with results via phone or letter.   Your physician wants you to follow up in: 6 months.  You will receive a reminder letter in the mail one-two months in advance.  If you don't receive a letter, please call our office to schedule the follow up appointment

## 2013-02-06 NOTE — Progress Notes (Signed)
Patient ID: Ryan Vaughn, male   DOB: 1950-12-23, 62 y.o.   MRN: [id number]    CARDIOLOGY CONSULT NOTE  Patient ID: Ryan Vaughn MRN: [id number] DOB/AGE: May 19, 1951 62 y.o.  PCP: Ryan Alcide, MD Reason for consultation: colonoscopy (pre-op risk stratification)  HPI: Ryan Vaughn is a 62 yr old man with a h/o CAD s/p CABG at age 62, HTN, and hyperlipidemia. He tells me it was one vessel CABG. He subsequently had 6 stents placed, 5 in 2009. He has a h/o colonic polyps and is scheduled to undergo colonoscopy.  He checks his BP at [facility] on occasion, and it is routinely in the 120/70-80 mmHg range. He used to work in [employer] in [place], and has chronic dyspnea. He says this hasn't progressed at all. He denies chest pain, and this was his primary symptom prior to CABG and stents. He hasn't used any SL nitro. He denies syncope, but may get lightheaded if he bends over and stands up too quickly.  He denies palpitations and leg swelling. He believes the last time he underwent cardiac testing was in 2009, in the form of non-invasive testing as per his recollection.  He believes his joints have been sore since starting Crestor.  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 He does so without difficulty.  SocHx: he smoked only for a short while as a teenager. Worked in the [employer] in [place], where he grew up, and retired from that in 1998. 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  FamHx: mother had CABG in her 58's he believes.    Allergies  Allergen Reactions  . Ivp Dye [Iodinated Diagnostic Agents] Other (See Comments)    headache    Current Outpatient Prescriptions  Medication Sig Dispense Refill  . aspirin 81 MG tablet Take 81 mg by mouth daily.      . clopidogrel (PLAVIX) 75 MG tablet Take 75 mg by mouth daily.      . cyclobenzaprine (FLEXERIL) 10 MG tablet Take 10 mg by mouth 3 (three) times daily as needed.       Marland Kitchen HYDROcodone-acetaminophen (NORCO/VICODIN) 5-325 MG per tablet Take 1 tablet by mouth every 4 (four) hours as needed for pain.  15 tablet  0  . ibuprofen (ADVIL,MOTRIN) 800 MG tablet Take 1 tablet (800 mg total) by mouth every 8 (eight) hours as needed for pain.  21 tablet  0  . metoprolol succinate (TOPROL-XL) 50 MG 24 hr tablet Take 50 mg by mouth daily. Take with or immediately following a meal.      . Multiple Vitamin (MULTIVITAMIN) tablet Take 1 tablet by mouth daily.      . rosuvastatin (CRESTOR) 20 MG tablet Take 20 mg by mouth daily.      . vitamin B-12 (CYANOCOBALAMIN) 500 MCG tablet Take 500 mcg by mouth daily.       No current facility-administered medications for this visit.    Past Medical History  Diagnosis Date  . Essential hypertension, benign   . Mixed hyperlipidemia   . Acute coronary occlusion without mycocardial infarction   . Personal history of colonic polyps   . Lumbago   . Routine general medical examination at a health care facility     No past surgical history on file.  History   Social History  . Marital Status: Single    Spouse Name: N/A  Number of Children: N/A  . Years of Education: N/A   Occupational History  . Not on file.   Social History Main Topics  . Smoking status: Former Games developer  . Smokeless tobacco: Not on file  . Alcohol Use: Not on file  . Drug Use: Not on file  . Sexual Activity: Not on file   Other Topics Concern  . Not on file   Social History Narrative  . No narrative on file      Review of systems complete and found to be negative unless listed above in HPI     Physical exam Blood pressure 103/68, pulse 56, height 5\' 11"  (1.803 m), weight 233 lb (105.688 kg). General: NAD Neck: No JVD, no thyromegaly or thyroid nodule.  Lungs: Clear to auscultation bilaterally with normal respiratory effort. CV: Nondisplaced PMI.  Heart regular S1/S2, no S3/S4, no murmur.  No peripheral edema.  No carotid bruit.  Normal  pedal pulses.  Abdomen: Soft, nontender, no hepatosplenomegaly, no distention.  Skin: Intact without lesions or rashes.  Neurologic: Alert and oriented x 3.  Psych: Normal affect. Extremities: No clubbing or cyanosis.  HEENT: Normal.   Labs:   No results found for this basename: WBC, HGB, HCT, MCV, PLT   No results found for this basename: NA, K, CL, CO2, BUN, CREATININE, CALCIUM, LABALBU, PROT, BILITOT, ALKPHOS, ALT, AST, GLUCOSE,  in the last 168 hours No results found for this basename: CKTOTAL, CKMB, CKMBINDEX, TROPONINI    No results found for this basename: CHOL   No results found for this basename: HDL   No results found for this basename: LDLCALC   No results found for this basename: TRIG   No results found for this basename: CHOLHDL   No results found for this basename: LDLDIRECT       EKG: ECG reviewed and in electronic medical record.     ASSESSMENT AND PLAN:  1. CAD s/p 1-v CABG with multiple stents: it appears he is relatively asymptomatic at the present time. In order to have a more accurate assessment of his exercise tolerance, and to assess his left ventricular function, I will obtain a stress echocardiogram which will aid in pre-operative risk stratification. He should continue ASA, Toprol-XL, and Crestor. Plavix can be held 3-5 days prior to his colonoscopy. 2. HTN: controlled on current therapy. 3. Hyperlipidemia: TC 190, TG 251, HDL 37, LDL 103 on 11/12/12. Continue Crestor 20 mg daily.  Signed: Prentice Docker, M.D., F.A.C.C. 02/06/2013, 9:02 AM

## 2013-02-12 ENCOUNTER — Other Ambulatory Visit: Payer: Self-pay

## 2013-02-12 ENCOUNTER — Other Ambulatory Visit (INDEPENDENT_AMBULATORY_CARE_PROVIDER_SITE_OTHER): Payer: BC Managed Care – PPO

## 2013-02-12 DIAGNOSIS — Z01810 Encounter for preprocedural cardiovascular examination: Secondary | ICD-10-CM

## 2013-02-12 DIAGNOSIS — Z951 Presence of aortocoronary bypass graft: Secondary | ICD-10-CM

## 2013-02-12 DIAGNOSIS — I25810 Atherosclerosis of coronary artery bypass graft(s) without angina pectoris: Secondary | ICD-10-CM

## 2013-02-12 DIAGNOSIS — I2510 Atherosclerotic heart disease of native coronary artery without angina pectoris: Secondary | ICD-10-CM

## 2013-02-13 ENCOUNTER — Encounter: Payer: Self-pay | Admitting: *Deleted

## 2013-02-26 ENCOUNTER — Encounter (HOSPITAL_COMMUNITY): Payer: Self-pay

## 2013-02-26 ENCOUNTER — Ambulatory Visit (HOSPITAL_COMMUNITY)
Admission: RE | Admit: 2013-02-26 | Discharge: 2013-02-26 | Disposition: A | Discharge status: Home / Self Care | Payer: BC Managed Care – PPO | Source: Ambulatory Visit | Attending: Cardiovascular Disease | Admitting: Cardiovascular Disease

## 2013-02-26 DIAGNOSIS — I2510 Atherosclerotic heart disease of native coronary artery without angina pectoris: Secondary | ICD-10-CM | POA: Insufficient documentation

## 2013-02-26 DIAGNOSIS — I10 Essential (primary) hypertension: Secondary | ICD-10-CM | POA: Insufficient documentation

## 2013-02-26 DIAGNOSIS — Z951 Presence of aortocoronary bypass graft: Secondary | ICD-10-CM

## 2013-02-26 DIAGNOSIS — I25810 Atherosclerosis of coronary artery bypass graft(s) without angina pectoris: Secondary | ICD-10-CM

## 2013-02-26 DIAGNOSIS — Z01810 Encounter for preprocedural cardiovascular examination: Secondary | ICD-10-CM

## 2013-02-26 DIAGNOSIS — Z6832 Body mass index (BMI) 32.0-32.9, adult: Secondary | ICD-10-CM | POA: Insufficient documentation

## 2013-02-26 NOTE — Progress Notes (Signed)
Stress Lab Nurses Notes - Keian Odriscoll 02/26/2013 Reason for doing test: CAD and Surgical Clearance Type of test: Stress Echo Nurse performing test: Parke Poisson, RN Nuclear Medicine Tech: Not Applicable Echo Tech: Veda Canning MD performing test: Dr. Wyline Mood / Jacolyn Reedy Family MD: Dr. Leandrew Koyanagi  Test explained and consent signed: yes IV started: No IV started Symptoms: SOB & Fatigue Treatment/Intervention: None Reason test stopped: fatigue and reached target HR After recovery IV was: NA Patient to return to Nuc. Med at : NA Patient discharged: Home Patient's Condition upon discharge was: stable Comments: During test peak BP 169/71 & HR 150.  Recovery BP 136/79 & HR 80.  Symptoms resolved in recovery. Erskine Speed T

## 2013-02-26 NOTE — Progress Notes (Signed)
[facility]   2D echo completed 02/26/2013.   Cindy Kimila Papaleo, RDCS  

## 2013-03-05 ENCOUNTER — Telehealth: Payer: Self-pay | Admitting: *Deleted

## 2013-03-05 NOTE — Telephone Encounter (Signed)
Message copied by Lesle Chris on Wed Mar 05, 2013  9:58 AM ------      Message from: Prentice Docker A      Created: Mon Mar 03, 2013 11:52 AM       Normal stress test results, can proceed safely with colonoscopy. ------

## 2013-03-05 NOTE — Telephone Encounter (Signed)
Notes Recorded by Lesle Chris, LPN on 19/14/7829 at 12:15 PM Patient notified and verbalized understanding. Will forward to PMD ([place]) at pt request.

## 2013-03-05 NOTE — Telephone Encounter (Signed)
Notes Recorded by Lesle Chris, LPN on 98/03/9146 at 9:57 AM Left message to return call.

## 2013-05-20 ENCOUNTER — Emergency Department (INDEPENDENT_AMBULATORY_CARE_PROVIDER_SITE_OTHER): Payer: BC Managed Care – PPO

## 2013-05-20 ENCOUNTER — Encounter (HOSPITAL_COMMUNITY): Payer: Self-pay | Admitting: Emergency Medicine

## 2013-05-20 ENCOUNTER — Emergency Department (HOSPITAL_COMMUNITY)
Admission: EM | Admit: 2013-05-20 | Discharge: 2013-05-20 | Disposition: A | Discharge status: Home / Self Care | Payer: BC Managed Care – PPO | Source: Home / Self Care | Attending: Family Medicine | Admitting: Family Medicine

## 2013-05-20 DIAGNOSIS — J40 Bronchitis, not specified as acute or chronic: Secondary | ICD-10-CM

## 2013-05-20 MED ORDER — AZITHROMYCIN 250 MG PO TABS: 250.0000 mg | ORAL_TABLET | Freq: Every day | ORAL | Status: DC

## 2013-05-20 MED ORDER — GUAIFENESIN-CODEINE 100-10 MG/5ML PO SOLN: 5.0000 mL | Freq: Every evening | ORAL | Status: DC | PRN

## 2013-05-20 MED ORDER — ALBUTEROL SULFATE HFA 108 (90 BASE) MCG/ACT IN AERS: 2.0000 | INHALATION_SPRAY | Freq: Four times a day (QID) | RESPIRATORY_TRACT | Status: DC | PRN

## 2013-05-20 MED ORDER — IPRATROPIUM BROMIDE 0.02 % IN SOLN
0.5000 mg | Freq: Once | RESPIRATORY_TRACT | Status: AC
  Administered 2013-05-20: 0.5 mg via RESPIRATORY_TRACT

## 2013-05-20 MED ORDER — PREDNISONE 10 MG PO TABS: 30.0000 mg | ORAL_TABLET | Freq: Every day | ORAL | Status: DC

## 2013-05-20 MED ORDER — ALBUTEROL SULFATE (5 MG/ML) 0.5% IN NEBU
5.0000 mg | INHALATION_SOLUTION | Freq: Once | RESPIRATORY_TRACT | Status: AC
  Administered 2013-05-20: 5 mg via RESPIRATORY_TRACT

## 2013-05-20 MED ORDER — IPRATROPIUM BROMIDE 0.02 % IN SOLN
RESPIRATORY_TRACT | Status: AC
  Filled 2013-05-20: qty 2.5

## 2013-05-20 NOTE — ED Provider Notes (Signed)
JACQUES FIFE is a 62 y.o. male who presents to Urgent Care today for cough congestion shortness of breath wheezing and body aches. This is been present since yesterday.  No nausea vomiting diarrhea or chest pain. No palpitations. No syncope or weakness. Symptoms are moderate. Patient has tried some over-the-counter medications which have not helped much. He feels well otherwise. No diagnosis of COPD or asthma. Patient is a former smoker.   Past Medical History  Diagnosis Date  . Essential hypertension, benign   . Mixed hyperlipidemia   . Acute coronary occlusion without mycocardial infarction   . Personal history of colonic polyps   . Lumbago   . Routine general medical examination at a health care facility    History  Substance Use Topics  . Smoking status: Former Games developer  . Smokeless tobacco: Not on file  . Alcohol Use: Not on file   ROS as above Medications reviewed. No current facility-administered medications for this encounter.   Current Outpatient Prescriptions  Medication Sig Dispense Refill  . albuterol (PROVENTIL HFA;VENTOLIN HFA) 108 (90 BASE) MCG/ACT inhaler Inhale 2 puffs into the lungs every 6 (six) hours as needed for wheezing or shortness of breath.  1 Inhaler  2  . aspirin 81 MG tablet Take 81 mg by mouth daily.      Marland Kitchen azithromycin (ZITHROMAX) 250 MG tablet Take 1 tablet (250 mg total) by mouth daily. Take first 2 tablets together, then 1 every day until finished.  6 tablet  0  . clopidogrel (PLAVIX) 75 MG tablet Take 75 mg by mouth daily.      . cyclobenzaprine (FLEXERIL) 10 MG tablet Take 10 mg by mouth 3 (three) times daily as needed.      Marland Kitchen guaiFENesin-codeine 100-10 MG/5ML syrup Take 5 mLs by mouth at bedtime as needed for cough.  120 mL  0  . HYDROcodone-acetaminophen (NORCO/VICODIN) 5-325 MG per tablet Take 1 tablet by mouth every 4 (four) hours as needed for pain.  15 tablet  0  . ibuprofen (ADVIL,MOTRIN) 800 MG tablet Take 1 tablet (800 mg total) by mouth  every 8 (eight) hours as needed for pain.  21 tablet  0  . metoprolol succinate (TOPROL-XL) 50 MG 24 hr tablet Take 50 mg by mouth daily. Take with or immediately following a meal.      . Multiple Vitamin (MULTIVITAMIN) tablet Take 1 tablet by mouth daily.      . predniSONE (DELTASONE) 10 MG tablet Take 3 tablets (30 mg total) by mouth daily.  15 tablet  0  . rosuvastatin (CRESTOR) 20 MG tablet Take 20 mg by mouth daily.      . vitamin B-12 (CYANOCOBALAMIN) 500 MCG tablet Take 500 mcg by mouth daily.        Exam:  BP 132/86  Pulse 81  Temp(Src) 99.3 F (37.4 C) (Oral)  Resp 18  SpO2 95% Gen: Well NAD HEENT: EOMI,  MMM posterior pharynx mildly erythematous Lungs: Normal work of breathing. Coarse breath sounds rhonchorous breath sounds present bilaterally. Expiratory wheezes present throughout. Heart: RRR no MRG Abd: NABS, Soft. NT, ND Exts: Non edematous BL  LE, warm and well perfused.   Patient was given a DuoNeb nebulizer course and had significant improvement in symptoms  No results found for this or any previous visit (from the past 24 hour(s)). Dg Chest 2 View  05/20/2013   CLINICAL DATA:  Cough, fever.  EXAM: CHEST  2 VIEW  COMPARISON:  12/23/2011  FINDINGS: Prior median  sternotomy. Mild cardiomegaly with vascular congestion. Peribronchial thickening and interstitial prominence compatible with bronchitis. No confluent opacities or effusions. No acute bony abnormality.  IMPRESSION: Bronchitic changes.  Mild cardiomegaly.   Electronically Signed   By: Charlett Nose M.D.   On: 05/20/2013 20:11    Assessment and Plan: 62 y.o. male with bronchitis. Plan to treat with prednisone, albuterol, azithromycin, and codeine containing cough medication. Followup with primary care provider. Discussed warning signs or symptoms. Please see discharge instructions. Patient expresses understanding.      Rodolph Bong, MD 05/20/13 2114

## 2013-05-20 NOTE — ED Notes (Signed)
Cough and fever w sweats since yesterday; history of stage 4 cancer

## 2013-08-18 ENCOUNTER — Ambulatory Visit: Payer: Self-pay | Admitting: Cardiovascular Disease

## 2013-08-27 ENCOUNTER — Encounter: Payer: Self-pay | Admitting: Cardiovascular Disease

## 2013-08-27 ENCOUNTER — Ambulatory Visit (INDEPENDENT_AMBULATORY_CARE_PROVIDER_SITE_OTHER): Payer: BC Managed Care – PPO | Admitting: Cardiovascular Disease

## 2013-08-27 VITALS — BP 115/77 | HR 60 | Ht 71.0 in | Wt 234.0 lb

## 2013-08-27 DIAGNOSIS — R5381 Other malaise: Secondary | ICD-10-CM

## 2013-08-27 DIAGNOSIS — R0602 Shortness of breath: Secondary | ICD-10-CM

## 2013-08-27 DIAGNOSIS — E785 Hyperlipidemia, unspecified: Secondary | ICD-10-CM

## 2013-08-27 DIAGNOSIS — I10 Essential (primary) hypertension: Secondary | ICD-10-CM

## 2013-08-27 DIAGNOSIS — I25810 Atherosclerosis of coronary artery bypass graft(s) without angina pectoris: Secondary | ICD-10-CM

## 2013-08-27 DIAGNOSIS — R079 Chest pain, unspecified: Secondary | ICD-10-CM

## 2013-08-27 DIAGNOSIS — R5383 Other fatigue: Secondary | ICD-10-CM

## 2013-08-27 MED ORDER — ISOSORBIDE DINITRATE 10 MG PO TABS: 10.0000 mg | ORAL_TABLET | Freq: Three times a day (TID) | ORAL | Status: DC

## 2013-08-27 NOTE — Progress Notes (Signed)
Patient ID: Ryan Vaughn, male   DOB: 1950/08/28, 63 y.o.   MRN: [id number]      SUBJECTIVE: Ryan Vaughn is a 63 yr old man with a h/o CAD s/p CABG at age 63, HTN, and hyperlipidemia. He tells me it was one vessel CABG. He subsequently had 6 stents placed, 5 in 2009. He underwent a normal stress echocardiogram in October 2014.  He had a polyp removed in the fall of last year without any difficulty. However, for the past 2 months, he has felt more fatigued with minimal exertion. He experiences chest burning and shortness of breath and has done so for the past 2 months both with and without exertion. He said this is a distinct change from last October when I evaluated him. However, he went to the zoo other day and walked approximately 5 miles, and while he was fatigued afterwards, he did not experience any chest pain or shortness of breath. He seldom has palpitations. He denies leg swelling. He also denies lightheadedness, dizziness and syncope.      Allergies  Allergen Reactions  . Ivp Dye [Iodinated Diagnostic Agents] Other (See Comments)    headache    Current Outpatient Prescriptions  Medication Sig Dispense Refill  . aspirin 81 MG tablet Take 81 mg by mouth daily.      . clopidogrel (PLAVIX) 75 MG tablet Take 75 mg by mouth daily.      . cyclobenzaprine (FLEXERIL) 10 MG tablet Take 10 mg by mouth 3 (three) times daily as needed.      Marland Kitchen HYDROcodone-acetaminophen (NORCO/VICODIN) 5-325 MG per tablet Take 1 tablet by mouth every 4 (four) hours as needed for pain.  15 tablet  0  . ibuprofen (ADVIL,MOTRIN) 800 MG tablet Take 1 tablet (800 mg total) by mouth every 8 (eight) hours as needed for pain.  21 tablet  0  . metoprolol succinate (TOPROL-XL) 50 MG 24 hr tablet Take 50 mg by mouth daily. Take with or immediately following a meal.      . Multiple Vitamin (MULTIVITAMIN) tablet Take 1 tablet by mouth daily.      . rosuvastatin (CRESTOR) 20 MG tablet Take 20 mg by mouth daily.      .  vitamin B-12 (CYANOCOBALAMIN) 500 MCG tablet Take 500 mcg by mouth daily.       No current facility-administered medications for this visit.    Past Medical History  Diagnosis Date  . Essential hypertension, benign   . Mixed hyperlipidemia   . Acute coronary occlusion without mycocardial infarction   . Personal history of colonic polyps   . Lumbago   . Routine general medical examination at a health care facility     Past Surgical History  Procedure Laterality Date  . Coronary stent placement      x r vessels  . Cholecystectomy      History   Social History  . Marital Status: Single    Spouse Name: N/A    Number of Children: N/A  . Years of Education: N/A   Occupational History  . Not on file.   Social History Main Topics  . Smoking status: Never Smoker   . Smokeless tobacco: Current User    Types: Snuff     Comment: dips snuff for 2 years now  . Alcohol Use: Not on file  . Drug Use: Not on file  . Sexual Activity: Not on file   Other Topics Concern  . Not on file   Social  History Narrative  . No narrative on file     Filed Vitals:   08/27/13 0846 08/27/13 0849  BP:  115/77  Pulse:  60  Height: 5\' 11"  (1.803 m) 5\' 11"  (1.803 m)  Weight:  234 lb (106.142 kg)    PHYSICAL EXAM General: NAD Neck: No JVD, no thyromegaly. Lungs: Clear to auscultation bilaterally with normal respiratory effort. CV: Nondisplaced PMI.  Regular rate and rhythm, normal S1/S2, no S3/S4, no murmur. No pretibial or periankle edema.  No carotid bruit.  Normal pedal pulses.  Abdomen: Soft, nontender, no hepatosplenomegaly, no distention.  Neurologic: Alert and oriented x 3.  Psych: Normal affect. Extremities: No clubbing or cyanosis.   ECG: reviewed and available in electronic records.      ASSESSMENT AND PLAN: 1. CAD s/p 1-v CABG with multiple stents: He has been experiencing fatigue, chest discomfort, and shortness of breath, all concerning for occult ischemic heart  disease. He had a normal stress echocardiogram in 02/2013. His CABG was several years ago. Continue ASA, Toprol-XL, Plavix (multiple stents and tolerating this medication) and Crestor. He appears to have CCS class II-III symptoms. I will add isosorbide dinitrate 10 mg 3 times daily. I will have him followup with me in 3 weeks. If at that time his symptoms have not improved, I would consider nuclear stress testing or coronary angiography. 2. HTN: Controlled on current therapy.  3. Hyperlipidemia: TC 190, TG 251, HDL 37, LDL 103 on 11/12/12. Continue Crestor 20 mg daily. Check lipids at next visit.  Dispo: f/u 3 weeks.  Kate Sable, M.D., F.A.C.C.

## 2013-08-27 NOTE — Patient Instructions (Signed)
   Begin Isosorbide DN three times per day - new sent to pharm Continue all other medications.   Follow up in  3 weeks

## 2013-08-27 NOTE — Addendum Note (Signed)
Addended by: Laurine Blazer on: 08/27/2013 10:30 AM   Modules accepted: Orders

## 2013-09-24 ENCOUNTER — Encounter: Payer: Self-pay | Admitting: Cardiovascular Disease

## 2013-09-24 ENCOUNTER — Ambulatory Visit (INDEPENDENT_AMBULATORY_CARE_PROVIDER_SITE_OTHER): Payer: BC Managed Care – PPO | Admitting: Cardiovascular Disease

## 2013-09-24 VITALS — BP 130/73 | HR 79 | Ht 71.0 in | Wt 231.0 lb

## 2013-09-24 DIAGNOSIS — R079 Chest pain, unspecified: Secondary | ICD-10-CM

## 2013-09-24 DIAGNOSIS — E785 Hyperlipidemia, unspecified: Secondary | ICD-10-CM

## 2013-09-24 DIAGNOSIS — R5381 Other malaise: Secondary | ICD-10-CM

## 2013-09-24 DIAGNOSIS — R5383 Other fatigue: Secondary | ICD-10-CM

## 2013-09-24 DIAGNOSIS — I25810 Atherosclerosis of coronary artery bypass graft(s) without angina pectoris: Secondary | ICD-10-CM

## 2013-09-24 DIAGNOSIS — I10 Essential (primary) hypertension: Secondary | ICD-10-CM

## 2013-09-24 DIAGNOSIS — R0602 Shortness of breath: Secondary | ICD-10-CM

## 2013-09-24 MED ORDER — METOPROLOL SUCCINATE ER 50 MG PO TB24: 50.0000 mg | ORAL_TABLET | Freq: Every morning | ORAL | Status: DC

## 2013-09-24 MED ORDER — ISOSORBIDE DINITRATE 20 MG PO TABS: 20.0000 mg | ORAL_TABLET | Freq: Three times a day (TID) | ORAL | Status: DC

## 2013-09-24 NOTE — Progress Notes (Signed)
Patient ID: Ryan Vaughn, male   DOB: 21-Apr-1951, 63 y.o.   MRN: [id number]      SUBJECTIVE: Ryan Vaughn is a 63 yr old man with a h/o CAD s/p CABG at age 63, HTN, and hyperlipidemia. He tells me it was one vessel CABG. He subsequently had 6 stents placed, 5 in 2009.  At his last visit, I started isosorbide dinitrate to alleviate symptoms of chest pain, fatigue, and shortness of breath.  He has had recurrent chest discomfort, occurring both with rest and exertion. He says "I know that something isn't right and I think it's my heart".    Allergies  Allergen Reactions  . Ivp Dye [Iodinated Diagnostic Agents] Other (See Comments)    headache    Current Outpatient Prescriptions  Medication Sig Dispense Refill  . aspirin 81 MG tablet Take 81 mg by mouth daily.      . clopidogrel (PLAVIX) 75 MG tablet Take 75 mg by mouth daily.      . cyclobenzaprine (FLEXERIL) 10 MG tablet Take 10 mg by mouth 3 (three) times daily as needed.      Marland Kitchen HYDROcodone-acetaminophen (NORCO/VICODIN) 5-325 MG per tablet Take 1 tablet by mouth every 4 (four) hours as needed for pain.  15 tablet  0  . ibuprofen (ADVIL,MOTRIN) 800 MG tablet Take 1 tablet (800 mg total) by mouth every 8 (eight) hours as needed for pain.  21 tablet  0  . isosorbide dinitrate (ISORDIL) 10 MG tablet Take 1 tablet (10 mg total) by mouth 3 (three) times daily.  90 tablet  6  . metoprolol succinate (TOPROL-XL) 50 MG 24 hr tablet Take 50 mg by mouth daily. Take with or immediately following a meal.      . Multiple Vitamin (MULTIVITAMIN) tablet Take 1 tablet by mouth daily.      . rosuvastatin (CRESTOR) 20 MG tablet Take 20 mg by mouth daily.      . vitamin B-12 (CYANOCOBALAMIN) 500 MCG tablet Take 500 mcg by mouth daily.       No current facility-administered medications for this visit.    Past Medical History  Diagnosis Date  . Essential hypertension, benign   . Mixed hyperlipidemia   . Acute coronary occlusion without mycocardial  infarction   . Personal history of colonic polyps   . Lumbago   . Routine general medical examination at a health care facility     Past Surgical History  Procedure Laterality Date  . Coronary stent placement      x r vessels  . Cholecystectomy      History   Social History  . Marital Status: Single    Spouse Name: N/A    Number of Children: N/A  . Years of Education: N/A   Occupational History  . Not on file.   Social History Main Topics  . Smoking status: Never Smoker   . Smokeless tobacco: Current User    Types: Snuff     Comment: dips snuff for 2 years now  . Alcohol Use: Not on file  . Drug Use: Not on file  . Sexual Activity: Not on file   Other Topics Concern  . Not on file   Social History Narrative  . No narrative on file     Filed Vitals:   09/24/13 1359  Height: 5\' 11"  (1.803 m)   BP 130/73 Pulse 79   PHYSICAL EXAM General: NAD Neck: No JVD, no thyromegaly. Lungs: Clear to auscultation bilaterally with normal respiratory  effort. CV: Nondisplaced PMI.  Regular rate and rhythm, normal S1/S2, no S3/S4, no murmur. No pretibial or periankle edema.  No carotid bruit.  Normal pedal pulses.  Abdomen: Soft, nontender, no hepatosplenomegaly, no distention.  Neurologic: Alert and oriented x 3.  Psych: Normal affect. Extremities: No clubbing or cyanosis.   ECG: reviewed and available in electronic records.      ASSESSMENT AND PLAN: 1. CAD s/p 1-v CABG with multiple stents: At his last visit, I started isosorbide dinitrate 10 mg tid. I will increase this to 20 mg tid and increase Toprol-XL to 50 mg q am and 25 mg q pm. He had a normal stress echocardiogram in 61/2014. His CABG was several years ago. Continue ASA, Toprol-XL, Plavix (multiple stents and tolerating this medication) and Crestor. He appears to have CCS class II-III symptoms.I will have him followup with me in 2-3 weeks. If at that time his symptoms have not improved, I would consider coronary  angiography.  2. HTN: Controlled on current therapy.  3. Hyperlipidemia: TC 190, TG 251, HDL 37, LDL 103 on 11/12/12. Continue Crestor 20 mg daily. Check lipids.  Dispo: f/u 2-3 weeks.   Kate Sable, M.D., F.A.C.C.

## 2013-09-24 NOTE — Patient Instructions (Signed)
Your physician recommends that you schedule a follow-up appointment in: 2-3 weeks. Your physician has recommended you make the following change in your medication:  Increase isosorbide dinitrate to 20 mg three times daily. You may take (2) of your 10 mg tablets until they are finished. Increase your toprol xl to 50 mg in the morning and 25 mg in the evening. Your new prescriptions were sent to your pharmacy. All other medications will remain the same. Your physician recommends that you have a FASTING lipid profile done.

## 2013-09-27 ENCOUNTER — Emergency Department (HOSPITAL_COMMUNITY)
Admission: EM | Admit: 2013-09-27 | Discharge: 2013-09-27 | Disposition: A | Discharge status: Home / Self Care | Payer: BC Managed Care – PPO | Source: Home / Self Care | Attending: Emergency Medicine | Admitting: Emergency Medicine

## 2013-09-27 ENCOUNTER — Encounter (HOSPITAL_COMMUNITY): Payer: Self-pay | Admitting: Emergency Medicine

## 2013-09-27 DIAGNOSIS — J069 Acute upper respiratory infection, unspecified: Secondary | ICD-10-CM

## 2013-09-27 MED ORDER — IPRATROPIUM BROMIDE 0.06 % NA SOLN: 2.0000 | Freq: Four times a day (QID) | NASAL | Status: DC

## 2013-09-27 MED ORDER — GUAIFENESIN-CODEINE 100-10 MG/5ML PO SYRP: 10.0000 mL | ORAL_SOLUTION | Freq: Four times a day (QID) | ORAL | Status: DC | PRN

## 2013-09-27 MED ORDER — PREDNISONE 20 MG PO TABS: 20.0000 mg | ORAL_TABLET | Freq: Two times a day (BID) | ORAL | Status: DC

## 2013-09-27 NOTE — ED Provider Notes (Signed)
  Chief Complaint   Chief Complaint  Patient presents with  . Cough    History of Present Illness   Ryan Vaughn is a 63-year-old male who has had a 3 to four-day history of cough productive of yellow-brown sputum, chest tightness, sweats, nasal congestion, rhinorrhea, right ear pain, and sore throat. He denies any fever, chills, wheezing, chest pain, headache, or GI symptoms. He has had no known sick exposures.  Review of Systems   Other than as noted above, the patient denies any of the following symptoms: Systemic:  No fevers, chills, sweats, or myalgias. Eye:  No redness or discharge. ENT:  No ear pain, headache, nasal congestion, drainage, sinus pressure, or sore throat. Neck:  No neck pain, stiffness, or swollen glands. Lungs:  No cough, sputum production, hemoptysis, wheezing, chest tightness, shortness of breath or chest pain. GI:  No abdominal pain, nausea, vomiting or diarrhea.  [place]   Past medical history, family history, social history, meds, and allergies were reviewed. He is allergic to MRI dye. He has a history of hypercholesterolemia, coronary artery disease with CABG and 5 steps. He also has had non-Hodgkin's lymphoma. Current meds include aspirin, Plavix, Isordil, Toprol, Crestor, and nitroglycerin as needed.  Physical exam   Vital signs:  BP 110/66  Pulse 68  Temp(Src) 98.6 F (37 C) (Oral)  Resp 17  SpO2 98% General:  Alert and oriented.  In no distress.  Skin warm and dry. Eye:  No conjunctival injection or drainage. Lids were normal. ENT:  TMs and canals were normal, without erythema or inflammation.  Nasal mucosa was clear and uncongested, without drainage.  Mucous membranes were moist.  Pharynx was clear with no exudate or drainage.  There were no oral ulcerations or lesions. Neck:  Supple, no adenopathy, tenderness or mass. Lungs:  No respiratory distress.  Lungs were clear to auscultation, without wheezes, rales or rhonchi.  Breath sounds were clear  and equal bilaterally.  Heart:  Regular rhythm, without gallops, murmers or rubs. Skin:  Clear, warm, and dry, without rash or lesions.  Assessment     The encounter diagnosis was Viral upper respiratory infection.  No indication for antibiotics.  Plan    1.  Meds:  The following meds were prescribed:   Discharge Medication List as of 09/27/2013 12:37 PM    START taking these medications   Details  guaiFENesin-codeine (GUIATUSS AC) 100-10 MG/5ML syrup Take 10 mLs by mouth 4 (four) times daily as needed for cough., Starting 09/27/2013, Until Discontinued, Print    ipratropium (ATROVENT) 0.06 % nasal spray Place 2 sprays into both nostrils 4 (four) times daily., Starting 09/27/2013, Until Discontinued, Normal    predniSONE (DELTASONE) 20 MG tablet Take 1 tablet (20 mg total) by mouth 2 (two) times daily., Starting 09/27/2013, Until Discontinued, Normal        2.  Patient Education/Counseling:  The patient was given appropriate handouts, self care instructions, and instructed in symptomatic relief.  Instructed to get extra fluids, rest, and use a cool mist vaporizer.    3.  Follow up:  The patient was told to follow up here if no better in 3 to 4 days, or sooner if becoming worse in any way, and given some red flag symptoms such as increasing fever, difficulty breathing, chest pain, or persistent vomiting which would prompt immediate return.  Follow up here as needed.      Harden Mo, MD 09/27/13 [phone]

## 2013-09-27 NOTE — Discharge Instructions (Signed)

## 2013-09-27 NOTE — ED Notes (Signed)
Pt  Reports  Symptoms  Of  A  Cough      With  Congestion and  A  r earache  With  Symptoms    X  3  Days   Cough  Started  Out  Productive  Non  Is  Becoming   More  Dry

## 2013-10-02 ENCOUNTER — Telehealth: Payer: Self-pay | Admitting: *Deleted

## 2013-10-02 NOTE — Telephone Encounter (Signed)
Message copied by Merlene Laughter on Thu Oct 02, 2013  3:56 PM ------      Message from: Kate Sable A      Created: Wed Oct 01, 2013  1:16 PM       Good results. ------

## 2013-10-07 NOTE — Telephone Encounter (Signed)
Patient informed. 

## 2013-10-17 ENCOUNTER — Encounter: Payer: Self-pay | Admitting: Cardiovascular Disease

## 2013-10-17 ENCOUNTER — Ambulatory Visit (INDEPENDENT_AMBULATORY_CARE_PROVIDER_SITE_OTHER): Payer: BC Managed Care – PPO | Admitting: Cardiovascular Disease

## 2013-10-17 VITALS — BP 99/67 | HR 64 | Ht 71.0 in | Wt 229.0 lb

## 2013-10-17 DIAGNOSIS — R079 Chest pain, unspecified: Secondary | ICD-10-CM

## 2013-10-17 DIAGNOSIS — I25810 Atherosclerosis of coronary artery bypass graft(s) without angina pectoris: Secondary | ICD-10-CM

## 2013-10-17 DIAGNOSIS — R5381 Other malaise: Secondary | ICD-10-CM

## 2013-10-17 DIAGNOSIS — Z951 Presence of aortocoronary bypass graft: Secondary | ICD-10-CM

## 2013-10-17 DIAGNOSIS — E785 Hyperlipidemia, unspecified: Secondary | ICD-10-CM

## 2013-10-17 DIAGNOSIS — R0602 Shortness of breath: Secondary | ICD-10-CM

## 2013-10-17 DIAGNOSIS — R5383 Other fatigue: Secondary | ICD-10-CM

## 2013-10-17 DIAGNOSIS — I10 Essential (primary) hypertension: Secondary | ICD-10-CM

## 2013-10-17 NOTE — Patient Instructions (Signed)
Continue all current medications. Follow up in  2 months  

## 2013-10-17 NOTE — Progress Notes (Signed)
Patient ID: ASAEL PANN, male   DOB: 05-24-1950, 63 y.o.   MRN: [id number]      SUBJECTIVE: [name] is a 63 yr old man with a h/o CAD s/p CABG at age 63, HTN, and hyperlipidemia. He previously told me it was one vessel CABG. He subsequently had 6 stents placed, 5 in 2009. He has been experiencing chest pain, fatigue, and shortness of breath, for which I have been trying to control his symptoms by optimizing his medical therapy with short acting nitrates and long-acting beta blockers. I recently ordered lipids which showed total cholesterol 153, triglycerides 109, HDL 40, LDL 91.  He is now feeling much better and says "there has been a noticeable difference". He previously rated his chest pain at 7/10, and now says it's a 2 or a 3. If he stands up too quickly after bending down he may get slightly lightheaded but denies syncope. He also denies palpitations. He works 12 hours a day and paces himself.  ECG performed in the office today demonstrates sinus rhythm with sinus arrhythmia. There are no ischemic ST segment or T-wave abnormalities.  Allergies  Allergen Reactions  . Ivp Dye [Iodinated Diagnostic Agents] Other (See Comments)    headache    Current Outpatient Prescriptions  Medication Sig Dispense Refill  . aspirin 81 MG tablet Take 81 mg by mouth daily.      . clopidogrel (PLAVIX) 75 MG tablet Take 75 mg by mouth daily.      Marland Kitchen ibuprofen (ADVIL,MOTRIN) 800 MG tablet Take 1 tablet (800 mg total) by mouth every 8 (eight) hours as needed for pain.  21 tablet  0  . ipratropium (ATROVENT) 0.06 % nasal spray Place 2 sprays into both nostrils 4 (four) times daily.  15 mL  12  . isosorbide dinitrate (ISORDIL) 20 MG tablet Take 1 tablet (20 mg total) by mouth 3 (three) times daily.  90 tablet  3  . metoprolol succinate (TOPROL-XL) 50 MG 24 hr tablet Take 1 tablet (50 mg total) by mouth every morning. & 25 mg in the evening. Take with or immediately following a meal.  45 tablet  3  .  Multiple Vitamin (MULTIVITAMIN) tablet Take 1 tablet by mouth daily.      . rosuvastatin (CRESTOR) 20 MG tablet Take 20 mg by mouth daily.      . vitamin B-12 (CYANOCOBALAMIN) 500 MCG tablet Take 500 mcg by mouth daily.       No current facility-administered medications for this visit.    Past Medical History  Diagnosis Date  . Essential hypertension, benign   . Mixed hyperlipidemia   . Acute coronary occlusion without mycocardial infarction   . Personal history of colonic polyps   . Lumbago   . Routine general medical examination at a health care facility     Past Surgical History  Procedure Laterality Date  . Coronary stent placement      x r vessels  . Cholecystectomy      History   Social History  . Marital Status: Single    Spouse Name: N/A    Number of Children: N/A  . Years of Education: N/A   Occupational History  . Not on file.   Social History Main Topics  . Smoking status: Never Smoker   . Smokeless tobacco: Current User    Types: Snuff     Comment: dips snuff for 2 years now  . Alcohol Use: Yes  . Drug Use: Not on file  .  Sexual Activity: Not on file   Other Topics Concern  . Not on file   Social History Narrative  . No narrative on file     Filed Vitals:   10/17/13 0840  BP: 99/67  Pulse: 64  Height: 5\' 11"  (1.803 m)  Weight: 229 lb (103.874 kg)    PHYSICAL EXAM General: NAD Neck: No JVD, no thyromegaly. Lungs: Clear to auscultation bilaterally with normal respiratory effort. CV: Nondisplaced PMI.  Regular rate and rhythm, normal S1/S2, no S3/S4, no murmur. No pretibial or periankle edema.  No carotid bruit.  Normal pedal pulses.  Abdomen: Soft, nontender, no hepatosplenomegaly, no distention.  Neurologic: Alert and oriented x 3.  Psych: Normal affect. Extremities: No clubbing or cyanosis.   ECG: reviewed and available in electronic records.      ASSESSMENT AND PLAN: 1. CAD s/p 1-v CABG with multiple stents: At his last visit,  I increased isosorbide dinitrate to 20 mg tid, and increased Toprol-XL to 50 mg q am and 25 mg q pm. This appears to have alleviated his symptoms considerably. He had a normal stress echocardiogram in 02/2013. His CABG was several years ago. Continue ASA, Toprol-XL, Plavix (multiple stents and tolerating this medication) and Crestor. He appears to have CCS class I symptoms now. I will have him followup with me in 2 months. If there is a recurrence of symptoms, I would then consider coronary angiography. 2. HTN: Low normal today on current therapy.  3. Hyperlipidemia: Results as noted above. Continue Crestor 20 mg daily.    Dispo: f/u 2 months.   Kate Sable, M.D., F.A.C.C.

## 2013-12-03 ENCOUNTER — Telehealth: Payer: Self-pay | Admitting: Cardiovascular Disease

## 2013-12-03 ENCOUNTER — Encounter: Payer: Self-pay | Admitting: *Deleted

## 2013-12-03 ENCOUNTER — Other Ambulatory Visit: Payer: Self-pay | Admitting: Cardiovascular Disease

## 2013-12-03 ENCOUNTER — Encounter: Payer: Self-pay | Admitting: Cardiovascular Disease

## 2013-12-03 ENCOUNTER — Ambulatory Visit (INDEPENDENT_AMBULATORY_CARE_PROVIDER_SITE_OTHER): Payer: BC Managed Care – PPO | Admitting: Cardiovascular Disease

## 2013-12-03 VITALS — BP 104/72 | HR 67 | Ht 71.0 in | Wt 231.0 lb

## 2013-12-03 DIAGNOSIS — I10 Essential (primary) hypertension: Secondary | ICD-10-CM

## 2013-12-03 DIAGNOSIS — R079 Chest pain, unspecified: Secondary | ICD-10-CM

## 2013-12-03 DIAGNOSIS — R0602 Shortness of breath: Secondary | ICD-10-CM

## 2013-12-03 DIAGNOSIS — I200 Unstable angina: Secondary | ICD-10-CM

## 2013-12-03 DIAGNOSIS — I25700 Atherosclerosis of coronary artery bypass graft(s), unspecified, with unstable angina pectoris: Secondary | ICD-10-CM

## 2013-12-03 DIAGNOSIS — I209 Angina pectoris, unspecified: Secondary | ICD-10-CM

## 2013-12-03 DIAGNOSIS — E785 Hyperlipidemia, unspecified: Secondary | ICD-10-CM

## 2013-12-03 DIAGNOSIS — I25810 Atherosclerosis of coronary artery bypass graft(s) without angina pectoris: Secondary | ICD-10-CM

## 2013-12-03 DIAGNOSIS — Z951 Presence of aortocoronary bypass graft: Secondary | ICD-10-CM

## 2013-12-03 DIAGNOSIS — R5381 Other malaise: Secondary | ICD-10-CM

## 2013-12-03 DIAGNOSIS — R5383 Other fatigue: Secondary | ICD-10-CM

## 2013-12-03 NOTE — Telephone Encounter (Signed)
Left heart cath - Friday, 7/17 at 10:30 - Dr. Burt Knack checking percert

## 2013-12-03 NOTE — Patient Instructions (Signed)
Your physician has requested that you have a cardiac catheterization. Cardiac catheterization is used to diagnose and/or treat various heart conditions. Doctors may recommend this procedure for a number of different reasons. The most common reason is to evaluate chest pain. Chest pain can be a symptom of coronary artery disease (CAD), and cardiac catheterization can show whether plaque is narrowing or blocking your heart's arteries. This procedure is also used to evaluate the valves, as well as measure the blood flow and oxygen levels in different parts of your heart. For further information please visit [email or web address]. Please follow instruction sheet, as given. Follow up will be given at time of discharge from above

## 2013-12-03 NOTE — Progress Notes (Signed)
Patient ID: Ryan Vaughn, male   DOB: [birth year], 63 y.o.   MRN: [id number]      SUBJECTIVE: Mr. Lepkowski is a 63 yr old man with a h/o CAD s/p CABG at age 18, HTN, and hyperlipidemia. He previously told me it was one vessel CABG. He subsequently had 6 stents placed, 5 in 2009. He had been experiencing chest pain, fatigue, and shortness of breath, for which I have been trying to control his symptoms by optimizing his medical therapy with short acting nitrates and long-acting beta blockers.  I previously ordered lipids which showed total cholesterol 153, triglycerides 109, HDL 40, LDL 91.  He was supposed to followup at a later date, but he has been experiencing significant chest tightness, dyspnea with minimal exertion, and progressive fatigue, and thus came in today. While he feels the increased dose of Isordil helped to control BP and symptoms to some degree, he has been significantly bothered and is scared.   Allergies  Allergen Reactions  . Ivp Dye [Iodinated Diagnostic Agents] Other (See Comments)    headache    Current Outpatient Prescriptions  Medication Sig Dispense Refill  . aspirin 81 MG tablet Take 81 mg by mouth daily.      . clopidogrel (PLAVIX) 75 MG tablet Take 75 mg by mouth daily.      Marland Kitchen ibuprofen (ADVIL,MOTRIN) 800 MG tablet Take 1 tablet (800 mg total) by mouth every 8 (eight) hours as needed for pain.  21 tablet  0  . ipratropium (ATROVENT) 0.06 % nasal spray Place 2 sprays into both nostrils 4 (four) times daily.  15 mL  12  . isosorbide dinitrate (ISORDIL) 20 MG tablet Take 1 tablet (20 mg total) by mouth 3 (three) times daily.  90 tablet  3  . metoprolol succinate (TOPROL-XL) 50 MG 24 hr tablet Take 1 tablet (50 mg total) by mouth every morning. & 25 mg in the evening. Take with or immediately following a meal.  45 tablet  3  . Multiple Vitamin (MULTIVITAMIN) tablet Take 1 tablet by mouth daily.      . rosuvastatin (CRESTOR) 20 MG tablet Take 20 mg by mouth daily.        . vitamin B-12 (CYANOCOBALAMIN) 500 MCG tablet Take 500 mcg by mouth daily.       No current facility-administered medications for this visit.    Past Medical History  Diagnosis Date  . Essential hypertension, benign   . Mixed hyperlipidemia   . Acute coronary occlusion without mycocardial infarction   . Personal history of colonic polyps   . Lumbago   . Routine general medical examination at a health care facility     Past Surgical History  Procedure Laterality Date  . Coronary stent placement      x r vessels  . Cholecystectomy      History   Social History  . Marital Status: Single    Spouse Name: N/A    Number of Children: N/A  . Years of Education: N/A   Occupational History  . Not on file.   Social History Main Topics  . Smoking status: Never Smoker   . Smokeless tobacco: Current User    Types: Snuff     Comment: dips snuff for 2 years now  . Alcohol Use: Yes  . Drug Use: Not on file  . Sexual Activity: Not on file   Other Topics Concern  . Not on file   Social History Narrative  . No narrative  on file     Filed Vitals:   12/03/13 1058  BP: 104/72  Pulse: 67  Height: 5\' 11"  (1.803 m)  Weight: 231 lb (104.781 kg)    PHYSICAL EXAM General: NAD Neck: No JVD, no thyromegaly. Lungs: Clear to auscultation bilaterally with normal respiratory effort. CV: Nondisplaced PMI.  Regular rate and rhythm, normal S1/S2, no S3/S4, no murmur. No pretibial or periankle edema.  No carotid bruit.  Normal pedal pulses.  Abdomen: Soft, nontender, no hepatosplenomegaly, no distention.  Neurologic: Alert and oriented x 3.  Psych: Normal affect. Extremities: No clubbing or cyanosis.   ECG: reviewed and available in electronic records.      ASSESSMENT AND PLAN: 1. CAD s/p 1-v CABG with multiple stents and increasing chest tightness, dyspnea, and fatigue: In spite of increasing isosorbide dinitrate to 20 mg tid, and increasing Toprol-XL to 50 mg q am and 25 mg q  pm, his symptoms have significantly progressed. He had a normal stress echocardiogram in 02/2013. His CABG was several years ago. Continue ASA, Toprol-XL, Plavix (multiple stents and tolerating this medication) and Crestor. He appears to have CCS class III symptoms now. I will arrange for coronary angiography.  2. HTN: Low normal today on current therapy.  3. Hyperlipidemia: Results as noted above. Continue Crestor 20 mg daily.   Dispo: f/u after cath.  Kate Sable, M.D., F.A.C.C.

## 2013-12-03 NOTE — Telephone Encounter (Signed)
Pt has [place].  No precert for heart caths for [employer].

## 2013-12-04 ENCOUNTER — Encounter (HOSPITAL_COMMUNITY): Payer: Self-pay | Admitting: Pharmacy Technician

## 2013-12-05 ENCOUNTER — Ambulatory Visit (HOSPITAL_COMMUNITY)
Admission: RE | Admit: 2013-12-05 | Discharge: 2013-12-06 | Disposition: A | Discharge status: Home / Self Care | Payer: BC Managed Care – PPO | Source: Ambulatory Visit | Attending: Cardiovascular Disease | Admitting: Cardiovascular Disease

## 2013-12-05 ENCOUNTER — Encounter (HOSPITAL_COMMUNITY): Payer: Self-pay | Admitting: General Practice

## 2013-12-05 ENCOUNTER — Encounter (HOSPITAL_COMMUNITY)
Admission: RE | Disposition: A | Discharge status: Home / Self Care | Payer: Self-pay | Source: Ambulatory Visit | Attending: Cardiovascular Disease

## 2013-12-05 DIAGNOSIS — Z860100 Personal history of colon polyps, unspecified: Secondary | ICD-10-CM | POA: Insufficient documentation

## 2013-12-05 DIAGNOSIS — I200 Unstable angina: Secondary | ICD-10-CM | POA: Diagnosis present

## 2013-12-05 DIAGNOSIS — Z7982 Long term (current) use of aspirin: Secondary | ICD-10-CM | POA: Insufficient documentation

## 2013-12-05 DIAGNOSIS — M1990 Unspecified osteoarthritis, unspecified site: Secondary | ICD-10-CM | POA: Diagnosis present

## 2013-12-05 DIAGNOSIS — Z91041 Radiographic dye allergy status: Secondary | ICD-10-CM | POA: Insufficient documentation

## 2013-12-05 DIAGNOSIS — I2510 Atherosclerotic heart disease of native coronary artery without angina pectoris: Secondary | ICD-10-CM | POA: Diagnosis present

## 2013-12-05 DIAGNOSIS — I10 Essential (primary) hypertension: Secondary | ICD-10-CM | POA: Diagnosis present

## 2013-12-05 DIAGNOSIS — M129 Arthropathy, unspecified: Secondary | ICD-10-CM | POA: Insufficient documentation

## 2013-12-05 DIAGNOSIS — C8590 Non-Hodgkin lymphoma, unspecified, unspecified site: Secondary | ICD-10-CM | POA: Diagnosis present

## 2013-12-05 DIAGNOSIS — I25700 Atherosclerosis of coronary artery bypass graft(s), unspecified, with unstable angina pectoris: Secondary | ICD-10-CM

## 2013-12-05 DIAGNOSIS — Z7902 Long term (current) use of antithrombotics/antiplatelets: Secondary | ICD-10-CM | POA: Insufficient documentation

## 2013-12-05 DIAGNOSIS — Z951 Presence of aortocoronary bypass graft: Secondary | ICD-10-CM

## 2013-12-05 DIAGNOSIS — E782 Mixed hyperlipidemia: Secondary | ICD-10-CM | POA: Insufficient documentation

## 2013-12-05 DIAGNOSIS — K635 Polyp of colon: Secondary | ICD-10-CM | POA: Diagnosis present

## 2013-12-05 DIAGNOSIS — N200 Calculus of kidney: Secondary | ICD-10-CM | POA: Diagnosis present

## 2013-12-05 DIAGNOSIS — Y849 Medical procedure, unspecified as the cause of abnormal reaction of the patient, or of later complication, without mention of misadventure at the time of the procedure: Secondary | ICD-10-CM | POA: Insufficient documentation

## 2013-12-05 DIAGNOSIS — C8589 Other specified types of non-Hodgkin lymphoma, extranodal and solid organ sites: Secondary | ICD-10-CM | POA: Insufficient documentation

## 2013-12-05 DIAGNOSIS — E785 Hyperlipidemia, unspecified: Secondary | ICD-10-CM | POA: Diagnosis present

## 2013-12-05 DIAGNOSIS — Z9861 Coronary angioplasty status: Secondary | ICD-10-CM | POA: Insufficient documentation

## 2013-12-05 HISTORY — DX: Non-Hodgkin lymphoma, unspecified, unspecified site: C85.90

## 2013-12-05 HISTORY — DX: Unspecified osteoarthritis, unspecified site: M19.90

## 2013-12-05 HISTORY — DX: Atherosclerotic heart disease of native coronary artery without angina pectoris: I25.10

## 2013-12-05 HISTORY — DX: Calculus of kidney: N20.0

## 2013-12-05 HISTORY — PX: PERCUTANEOUS CORONARY STENT INTERVENTION (PCI-S): SHX5485

## 2013-12-05 HISTORY — PX: LEFT HEART CATHETERIZATION WITH CORONARY ANGIOGRAM: SHX5451

## 2013-12-05 HISTORY — DX: Cardiac murmur, unspecified: R01.1

## 2013-12-05 LAB — CBC
HCT: 44 % (ref 39.0–52.0)
HEMOGLOBIN: 15 g/dL (ref 13.0–17.0)
MCH: 30.9 pg (ref 26.0–34.0)
MCHC: 34.1 g/dL (ref 30.0–36.0)
MCV: 90.7 fL (ref 78.0–100.0)
Platelets: 164 10*3/uL (ref 150–400)
RBC: 4.85 MIL/uL (ref 4.22–5.81)
RDW: 13.3 % (ref 11.5–15.5)
WBC: 5.1 10*3/uL (ref 4.0–10.5)

## 2013-12-05 LAB — PLATELET INHIBITION P2Y12: Platelet Function  P2Y12: 139 [PRU] — ABNORMAL LOW (ref 194–418)

## 2013-12-05 LAB — POCT ACTIVATED CLOTTING TIME
ACTIVATED CLOTTING TIME: 225 s
ACTIVATED CLOTTING TIME: 248 s
Activated Clotting Time: 247 seconds

## 2013-12-05 LAB — BASIC METABOLIC PANEL
Anion gap: 13 (ref 5–15)
BUN: 13 mg/dL (ref 6–23)
CHLORIDE: 100 meq/L (ref 96–112)
CO2: 26 meq/L (ref 19–32)
Calcium: 9.2 mg/dL (ref 8.4–10.5)
Creatinine, Ser: 1.06 mg/dL (ref 0.50–1.35)
GFR calc Af Amer: 86 mL/min — ABNORMAL LOW (ref >90)
GFR calc non Af Amer: 74 mL/min — ABNORMAL LOW (ref >90)
Glucose, Bld: 109 mg/dL — ABNORMAL HIGH (ref 70–99)
Potassium: 4.5 mEq/L (ref 3.7–5.3)
Sodium: 139 mEq/L (ref 137–147)

## 2013-12-05 LAB — PROTIME-INR
INR: 1.02 (ref 0.00–1.49)
PROTHROMBIN TIME: 13.4 s (ref 11.6–15.2)

## 2013-12-05 SURGERY — LEFT HEART CATHETERIZATION WITH CORONARY ANGIOGRAM
Anesthesia: LOCAL

## 2013-12-05 MED ORDER — HEPARIN (PORCINE) IN NACL 2-0.9 UNIT/ML-% IJ SOLN
INTRAMUSCULAR | Status: AC
  Filled 2013-12-05: qty 1000

## 2013-12-05 MED ORDER — SODIUM CHLORIDE 0.9 % IJ SOLN: 3.0000 mL | Freq: Two times a day (BID) | INTRAMUSCULAR | Status: DC

## 2013-12-05 MED ORDER — VERAPAMIL HCL 2.5 MG/ML IV SOLN
INTRAVENOUS | Status: AC
  Filled 2013-12-05: qty 2

## 2013-12-05 MED ORDER — BIVALIRUDIN 250 MG IV SOLR
INTRAVENOUS | Status: AC
  Filled 2013-12-05: qty 250

## 2013-12-05 MED ORDER — CLOPIDOGREL BISULFATE 75 MG PO TABS: 75.0000 mg | ORAL_TABLET | Freq: Every day | ORAL | Status: DC

## 2013-12-05 MED ORDER — MIDAZOLAM HCL 2 MG/2ML IJ SOLN
INTRAMUSCULAR | Status: AC
  Filled 2013-12-05: qty 2

## 2013-12-05 MED ORDER — FENTANYL CITRATE 0.05 MG/ML IJ SOLN
INTRAMUSCULAR | Status: AC
  Filled 2013-12-05: qty 2

## 2013-12-05 MED ORDER — SODIUM CHLORIDE 0.9 % IJ SOLN
3.0000 mL | Freq: Two times a day (BID) | INTRAMUSCULAR | Status: DC
  Administered 2013-12-05: 20:00:00 3 mL via INTRAVENOUS

## 2013-12-05 MED ORDER — SODIUM CHLORIDE 0.9 % IV SOLN: 250.0000 mL | INTRAVENOUS | Status: DC | PRN

## 2013-12-05 MED ORDER — LIDOCAINE HCL (PF) 1 % IJ SOLN
INTRAMUSCULAR | Status: AC
  Filled 2013-12-05: qty 30

## 2013-12-05 MED ORDER — NITROGLYCERIN 1 MG/10 ML FOR IR/CATH LAB
INTRA_ARTERIAL | Status: AC
  Filled 2013-12-05: qty 10

## 2013-12-05 MED ORDER — ASPIRIN 81 MG PO CHEW
CHEWABLE_TABLET | ORAL | Status: AC
  Administered 2013-12-05: 81 mg
  Filled 2013-12-05: qty 1

## 2013-12-05 MED ORDER — SODIUM CHLORIDE 0.9 % IJ SOLN: 3.0000 mL | INTRAMUSCULAR | Status: DC | PRN

## 2013-12-05 MED ORDER — MORPHINE SULFATE 2 MG/ML IJ SOLN
2.0000 mg | Freq: Once | INTRAMUSCULAR | Status: AC
  Administered 2013-12-05: 2 mg via INTRAVENOUS
  Filled 2013-12-05: qty 1

## 2013-12-05 MED ORDER — ASPIRIN 81 MG PO CHEW: 81.0000 mg | CHEWABLE_TABLET | ORAL | Status: DC

## 2013-12-05 MED ORDER — IPRATROPIUM BROMIDE 0.06 % NA SOLN
2.0000 | Freq: Four times a day (QID) | NASAL | Status: DC | PRN
  Filled 2013-12-05: qty 15

## 2013-12-05 MED ORDER — HEPARIN SODIUM (PORCINE) 1000 UNIT/ML IJ SOLN
INTRAMUSCULAR | Status: AC
  Filled 2013-12-05: qty 1

## 2013-12-05 MED ORDER — ACETAMINOPHEN 325 MG PO TABS
650.0000 mg | ORAL_TABLET | ORAL | Status: DC | PRN
  Administered 2013-12-05: 650 mg via ORAL
  Filled 2013-12-05: qty 2

## 2013-12-05 MED ORDER — ATORVASTATIN CALCIUM 40 MG PO TABS
40.0000 mg | ORAL_TABLET | Freq: Every day | ORAL | Status: DC
Start: 2013-12-05 — End: 2013-12-06
  Administered 2013-12-05: 17:00:00 40 mg via ORAL
  Filled 2013-12-05 (×2): qty 1

## 2013-12-05 MED ORDER — ASPIRIN 81 MG PO TABS: 81.0000 mg | ORAL_TABLET | Freq: Every day | ORAL | Status: DC

## 2013-12-05 MED ORDER — OXYCODONE-ACETAMINOPHEN 5-325 MG PO TABS
1.0000 | ORAL_TABLET | ORAL | Status: DC | PRN
  Administered 2013-12-05: 17:00:00 1 via ORAL
  Filled 2013-12-05: qty 1

## 2013-12-05 MED ORDER — ISOSORBIDE DINITRATE 20 MG PO TABS
20.0000 mg | ORAL_TABLET | Freq: Three times a day (TID) | ORAL | Status: DC
  Administered 2013-12-05: 20 mg via ORAL
  Filled 2013-12-05 (×5): qty 1

## 2013-12-05 MED ORDER — METOPROLOL SUCCINATE ER 25 MG PO TB24
25.0000 mg | ORAL_TABLET | Freq: Two times a day (BID) | ORAL | Status: DC
  Administered 2013-12-05: 25 mg via ORAL
  Filled 2013-12-05 (×3): qty 1

## 2013-12-05 MED ORDER — ASPIRIN 81 MG PO CHEW
81.0000 mg | CHEWABLE_TABLET | Freq: Every day | ORAL | Status: DC
  Filled 2013-12-05: qty 1

## 2013-12-05 MED ORDER — SODIUM CHLORIDE 0.9 % IV SOLN
INTRAVENOUS | Status: DC
  Administered 2013-12-05: 09:00:00 via INTRAVENOUS

## 2013-12-05 MED ORDER — SODIUM CHLORIDE 0.9 % IV SOLN
1.0000 mL/kg/h | INTRAVENOUS | Status: AC
  Administered 2013-12-05 (×2): 1 mL/kg/h via INTRAVENOUS

## 2013-12-05 MED ORDER — ONDANSETRON HCL 4 MG/2ML IJ SOLN: 4.0000 mg | Freq: Four times a day (QID) | INTRAMUSCULAR | Status: DC | PRN

## 2013-12-05 NOTE — CV Procedure (Signed)
Cardiac Catheterization Procedure Note  Name: Ryan Vaughn MRN: [id number] DOB: 1950-06-18  Procedure: Left Heart Cath, Selective Coronary Angiography, LIMA angiography, LV angiography, PTCA and stenting of the mid-circumflex, PTCA and stenting of the OM1  Indication: CCS Class 3 angina, crescendo pattern  Procedural Details:  The left wrist was prepped, draped, and anesthetized with 1% lidocaine. Using the modified Seldinger technique, a 5/6 French sheath was introduced into the left radial artery. 3 mg of verapamil was administered through the sheath, weight-based unfractionated heparin was administered intravenously. Standard Judkins catheters were used for selective coronary angiography and left ventriculography. Catheter exchanges were performed over an exchange length guidewire.  PROCEDURAL FINDINGS Hemodynamics: AO 106/65 LV 100/15   Coronary angiography: Coronary dominance: right  Left mainstem:  The left mainstem is patent with minor irregularity. The distal left mainstem has 20% stenosis.  Left anterior descending (LAD): The LAD is 100% occluded at its origin. The vessel fills from the mammary graft.  Left circumflex (LCx): The left circumflex is heavily stented. The first obtuse marginal has diffuse 90% stenosis. This appears to be diffuse in-stent restenosis. The AV circumflex beyond the first OM is heavily stented with approximately 3 stents noted. There is a 95% stenosis in the midcircumflex at the proximal edge of the distally stented area. There is a posterolateral branch without significant stenosis beyond the stented segment.  Right coronary artery (RCA): This is a dominant vessel. The RCA has diffuse plaquing without high-grade obstruction. The stented segment in the distal vessel is widely patent. The PDA branch is widely patent.  LIMA to LAD: Widely patent throughout. No obstructive disease. The vessel is tortuous. The distal anastomotic site is widely patent  without significant stenosis. The septal perforators and diagonal branches are patent.  Left ventriculography: Left ventricular systolic function is normal, LVEF is estimated at 55-65%, there is no significant mitral regurgitation   PCI Note:  Following the diagnostic procedure, the decision was made to proceed with PCI. The first obtuse marginal and mid circumflex areas both had severe stenoses. Attention was first turned to the midcircumflex. Additional unfractionated heparin was administered. Once a therapeutic ACT was achieved, a 6 [place] XB LAD 3.5 cm guide catheter was inserted.  A cougar coronary guidewire was used to cross the lesion.  The lesion was predilated with a 2.5 x 12 mm balloon.  The lesion was then stented with a 2.75 x 20 mm Promus premier drug-eluting stent.  The midportion of the stent was clearly underexpanded. The stent was postdilated with a 3.0 x 15 mm noncompliant balloon to 22 atmospheres. It still appeared underexpanded to a 3.0 x 6 mm  Noncompliant balloon was dilated to a maximum pressure of 24 atmospheres directly over the area of under expansion. I felt this was the best obtainable result as there was approximately 20% residual stenosis. Attention was then turned to the first obtuse marginal branch. That branch was wired with a second cougar wire. It was predilated with a 2.5 mm balloon to 8 atmospheres. The lesion was then stented with a 2.75 x 20 mm Promus premier DES deployed at 12 atmospheres. The stent was postdilated to 16 atmospheres with a 3.0 mm noncompliant balloon. Final angiography confirmed an excellent result. The patient tolerated the procedure well. There were no immediate procedural complications. A TR band was used for radial hemostasis. The patient was transferred to the post catheterization recovery area for further monitoring.  PCI Data: Lesion 1 Vessel - LCx/Segment - mid (ISR)  Percent Stenosis (pre)  95 TIMI-flow 3 Stent 2.75x20 mm Promus  DES Percent Stenosis (post) 20 TIMI-flow (post) 3  Lesion 2 Vessel - OM1/Segment - proximal (ISR) Percent Stenosis (pre)  90 TIMI-flow 3 Stent 2.75x20 mm Promus DES Percent Stenosis (post) 0 TIMI-flow (post) 3  Final Conclusions:   1. 3 vessel coronary artery disease with total occlusion of the proximal LAD, continued patency of the stented segment in the distal RCA, and severe in-stent restenosis in the first OM branch and mid left circumflex  2. Status post single-vessel CABG with continued patency of the LIMA to LAD  3. Normal LV systolic function  4. Successful PCI of the first OM and mid circumflex using drug-eluting stent platforms.  Recommendations:  Continue long-term dual antiplatelet therapy with aspirin and Plavix, minimum of 12 months.  Sherren Mocha 12/05/2013, 1:35 PM

## 2013-12-05 NOTE — H&P (View-Only) (Signed)
Patient ID: RYOSUKE ERICKSEN, male   DOB: [birth year], 62 y.o.   MRN: [id number]      SUBJECTIVE: Mr. Azbill is a 63 yr old man with a h/o CAD s/p CABG at age 62, HTN, and hyperlipidemia. He previously told me it was one vessel CABG. He subsequently had 6 stents placed, 5 in 2009. He had been experiencing chest pain, fatigue, and shortness of breath, for which I have been trying to control his symptoms by optimizing his medical therapy with short acting nitrates and long-acting beta blockers.  I previously ordered lipids which showed total cholesterol 153, triglycerides 109, HDL 40, LDL 91.  He was supposed to followup at a later date, but he has been experiencing significant chest tightness, dyspnea with minimal exertion, and progressive fatigue, and thus came in today. While he feels the increased dose of Isordil helped to control BP and symptoms to some degree, he has been significantly bothered and is scared.   Allergies  Allergen Reactions  . Ivp Dye [Iodinated Diagnostic Agents] Other (See Comments)    headache    Current Outpatient Prescriptions  Medication Sig Dispense Refill  . aspirin 81 MG tablet Take 81 mg by mouth daily.      . clopidogrel (PLAVIX) 75 MG tablet Take 75 mg by mouth daily.      Marland Kitchen ibuprofen (ADVIL,MOTRIN) 800 MG tablet Take 1 tablet (800 mg total) by mouth every 8 (eight) hours as needed for pain.  21 tablet  0  . ipratropium (ATROVENT) 0.06 % nasal spray Place 2 sprays into both nostrils 4 (four) times daily.  15 mL  12  . isosorbide dinitrate (ISORDIL) 20 MG tablet Take 1 tablet (20 mg total) by mouth 3 (three) times daily.  90 tablet  3  . metoprolol succinate (TOPROL-XL) 50 MG 24 hr tablet Take 1 tablet (50 mg total) by mouth every morning. & 25 mg in the evening. Take with or immediately following a meal.  45 tablet  3  . Multiple Vitamin (MULTIVITAMIN) tablet Take 1 tablet by mouth daily.      . rosuvastatin (CRESTOR) 20 MG tablet Take 20 mg by mouth daily.        . vitamin B-12 (CYANOCOBALAMIN) 500 MCG tablet Take 500 mcg by mouth daily.       No current facility-administered medications for this visit.    Past Medical History  Diagnosis Date  . Essential hypertension, benign   . Mixed hyperlipidemia   . Acute coronary occlusion without mycocardial infarction   . Personal history of colonic polyps   . Lumbago   . Routine general medical examination at a health care facility     Past Surgical History  Procedure Laterality Date  . Coronary stent placement      x r vessels  . Cholecystectomy      History   Social History  . Marital Status: Single    Spouse Name: N/A    Number of Children: N/A  . Years of Education: N/A   Occupational History  . Not on file.   Social History Main Topics  . Smoking status: Never Smoker   . Smokeless tobacco: Current User    Types: Snuff     Comment: dips snuff for 2 years now  . Alcohol Use: Yes  . Drug Use: Not on file  . Sexual Activity: Not on file   Other Topics Concern  . Not on file   Social History Narrative  . No narrative  on file     Filed Vitals:   12/03/13 1058  BP: 104/72  Pulse: 67  Height: 5\' 11"  (1.803 m)  Weight: 231 lb (104.781 kg)    PHYSICAL EXAM General: NAD Neck: No JVD, no thyromegaly. Lungs: Clear to auscultation bilaterally with normal respiratory effort. CV: Nondisplaced PMI.  Regular rate and rhythm, normal S1/S2, no S3/S4, no murmur. No pretibial or periankle edema.  No carotid bruit.  Normal pedal pulses.  Abdomen: Soft, nontender, no hepatosplenomegaly, no distention.  Neurologic: Alert and oriented x 3.  Psych: Normal affect. Extremities: No clubbing or cyanosis.   ECG: reviewed and available in electronic records.      ASSESSMENT AND PLAN: 1. CAD s/p 1-v CABG with multiple stents and increasing chest tightness, dyspnea, and fatigue: In spite of increasing isosorbide dinitrate to 20 mg tid, and increasing Toprol-XL to 50 mg q am and 25 mg q  pm, his symptoms have significantly progressed. He had a normal stress echocardiogram in 02/2013. His CABG was several years ago. Continue ASA, Toprol-XL, Plavix (multiple stents and tolerating this medication) and Crestor. He appears to have CCS class III symptoms now. I will arrange for coronary angiography.  2. HTN: Low normal today on current therapy.  3. Hyperlipidemia: Results as noted above. Continue Crestor 20 mg daily.   Dispo: f/u after cath.  Kate Sable, M.D., F.A.C.C.

## 2013-12-05 NOTE — Interval H&P Note (Signed)
History and Physical Interval Note:  12/05/2013 12:12 PM  Ryan Vaughn  has presented today for surgery, with the diagnosis of cp, cad  The various methods of treatment have been discussed with the patient and family. After consideration of risks, benefits and other options for treatment, the patient has consented to  Procedure(s): LEFT HEART CATHETERIZATION WITH CORONARY ANGIOGRAM (N/A) as a surgical intervention .  The patient's history has been reviewed, patient examined, no change in status, stable for surgery.  I have reviewed the patient's chart and labs.  Questions were answered to the patient's satisfaction.    Cath Lab Visit (complete for each Cath Lab visit)  Clinical Evaluation Leading to the Procedure:   ACS: No.  Non-ACS:    Anginal Classification: CCS III  Anti-ischemic medical therapy: Maximal Therapy (2 or more classes of medications)  Non-Invasive Test Results: No non-invasive testing performed  Prior CABG: Previous CABG       Sherren Mocha

## 2013-12-05 NOTE — Progress Notes (Signed)
TR BAND REMOVAL  LOCATION:  left radial  DEFLATED PER PROTOCOL:  Yes.    TIME BAND OFF / DRESSING APPLIED:   1900   SITE UPON ARRIVAL:   Level 0  SITE AFTER BAND REMOVAL:  Level 0  REVERSE ALLEN'S TEST:    positive  CIRCULATION SENSATION AND MOVEMENT:  Within Normal Limits  Yes.    COMMENTS:

## 2013-12-06 ENCOUNTER — Encounter (HOSPITAL_COMMUNITY): Payer: Self-pay | Admitting: Physician Assistant

## 2013-12-06 DIAGNOSIS — I2510 Atherosclerotic heart disease of native coronary artery without angina pectoris: Secondary | ICD-10-CM | POA: Diagnosis present

## 2013-12-06 DIAGNOSIS — I25810 Atherosclerosis of coronary artery bypass graft(s) without angina pectoris: Secondary | ICD-10-CM

## 2013-12-06 DIAGNOSIS — I10 Essential (primary) hypertension: Secondary | ICD-10-CM

## 2013-12-06 DIAGNOSIS — I200 Unstable angina: Secondary | ICD-10-CM

## 2013-12-06 DIAGNOSIS — C8590 Non-Hodgkin lymphoma, unspecified, unspecified site: Secondary | ICD-10-CM | POA: Diagnosis present

## 2013-12-06 DIAGNOSIS — N200 Calculus of kidney: Secondary | ICD-10-CM | POA: Diagnosis present

## 2013-12-06 DIAGNOSIS — M1990 Unspecified osteoarthritis, unspecified site: Secondary | ICD-10-CM | POA: Diagnosis present

## 2013-12-06 LAB — BASIC METABOLIC PANEL
ANION GAP: 10 (ref 5–15)
BUN: 14 mg/dL (ref 6–23)
CHLORIDE: 99 meq/L (ref 96–112)
CO2: 28 mEq/L (ref 19–32)
CREATININE: 1 mg/dL (ref 0.50–1.35)
Calcium: 9.1 mg/dL (ref 8.4–10.5)
GFR, EST NON AFRICAN AMERICAN: 79 mL/min — AB (ref >90)
Glucose, Bld: 145 mg/dL — ABNORMAL HIGH (ref 70–99)
POTASSIUM: 4.5 meq/L (ref 3.7–5.3)
Sodium: 137 mEq/L (ref 137–147)

## 2013-12-06 LAB — CBC
HCT: 42.8 % (ref 39.0–52.0)
Hemoglobin: 14 g/dL (ref 13.0–17.0)
MCH: 30.2 pg (ref 26.0–34.0)
MCHC: 32.7 g/dL (ref 30.0–36.0)
MCV: 92.4 fL (ref 78.0–100.0)
Platelets: 169 10*3/uL (ref 150–400)
RBC: 4.63 MIL/uL (ref 4.22–5.81)
RDW: 13.6 % (ref 11.5–15.5)
WBC: 5.6 10*3/uL (ref 4.0–10.5)

## 2013-12-06 MED ORDER — METOPROLOL SUCCINATE ER 25 MG PO TB24: 25.0000 mg | ORAL_TABLET | Freq: Two times a day (BID) | ORAL | Status: DC

## 2013-12-06 NOTE — Discharge Instructions (Signed)

## 2013-12-06 NOTE — Progress Notes (Signed)
Pt walking independently, no c/o. Ed completed/reviewed with pt and girlfriend. Voiced understanding. Thinking of quitting snuff. Unable to do CRPII due to work schedule. Plans to retire in 10 months and can ex more then. [id number] Yves Dill CES, ACSM 10:03 AM 12/06/2013

## 2013-12-06 NOTE — Progress Notes (Signed)
PROGRESS NOTE  Subjective:   Mr. Feutz is a 63 year old gentleman with a history of exertional angina. He's been seen by Dr. Bronson Ing.  PCI by Dr. Burt Knack yesterday.  Feels well.   Objective:    Vital Signs:   Temp:  [97.2 F (36.2 C)-98.2 F (36.8 C)] 97.9 F (36.6 C) (07/18 0801) Pulse Rate:  [58-67] 65 (07/18 0801) Resp:  [16-20] 16 (07/18 0801) BP: (100-126)/(62-84) 111/71 mmHg (07/18 0801) SpO2:  [94 %-99 %] 95 % (07/18 0801)  Last BM Date: 12/05/13   24-hour weight change: Weight change:   Weight trends: Filed Weights   12/05/13 0817  Weight: 230 lb (104.327 kg)    Intake/Output:  07/17 0701 - 07/18 0700 In: 1401.5 [P.O.:880; I.V.:521.5] Out: 1400 [Urine:1400] Total I/O In: 240 [P.O.:240] Out: -    Physical Exam: BP 111/71  Pulse 65  Temp(Src) 97.9 F (36.6 C) (Oral)  Resp 16  Ht 5\' 11"  (1.803 m)  Wt 230 lb (104.327 kg)  BMI 32.09 kg/m2  SpO2 95%  Wt Readings from Last 3 Encounters:  12/05/13 230 lb (104.327 kg)  12/05/13 230 lb (104.327 kg)  12/03/13 231 lb (104.781 kg)    General: Vital signs reviewed and noted.   Head: Normocephalic, atraumatic.  Eyes: conjunctivae/corneas clear.  EOM's intact.   Throat: normal  Neck:  normal  Lungs:    clear  Heart:  RR, s1s2  Abdomen:  Soft, non-tender, non-distended    Extremities: Left radial cath site looks great    Neurologic: A&O X3, CN II - XII are grossly intact.   Psych: Normal     Labs: BMET:  Recent Labs  12/05/13 0836 12/06/13 0348  NA 139 137  K 4.5 4.5  CL 100 99  CO2 26 28  GLUCOSE 109* 145*  BUN 13 14  CREATININE 1.06 1.00  CALCIUM 9.2 9.1    Liver function tests: No results found for this basename: AST, ALT, ALKPHOS, BILITOT, PROT, ALBUMIN,  in the last 72 hours No results found for this basename: LIPASE, AMYLASE,  in the last 72 hours  CBC:  Recent Labs  12/05/13 0836 12/06/13 0348  WBC 5.1 5.6  HGB 15.0 14.0  HCT 44.0 42.8  MCV 90.7 92.4  PLT  164 169    Cardiac Enzymes: No results found for this basename: CKTOTAL, CKMB, TROPONINI,  in the last 72 hours  Coagulation Studies:  Recent Labs  12/05/13 0836  LABPROT 13.4  INR 1.02    Other: No components found with this basename: POCBNP,  No results found for this basename: DDIMER,  in the last 72 hours No results found for this basename: HGBA1C,  in the last 72 hours No results found for this basename: CHOL, HDL, LDLCALC, TRIG, CHOLHDL,  in the last 72 hours No results found for this basename: TSH, T4TOTAL, FREET3, T3FREE, THYROIDAB,  in the last 72 hours No results found for this basename: VITAMINB12, FOLATE, FERRITIN, TIBC, IRON, RETICCTPCT,  in the last 72 hours   Other results: Tele:  NSR .  Medications:    Infusions:    Scheduled Medications: . aspirin  81 mg Oral Daily  . atorvastatin  40 mg Oral q1800  . clopidogrel  75 mg Oral Daily  . isosorbide dinitrate  20 mg Oral TID  . metoprolol succinate  25 mg Oral BID  . sodium chloride  3 mL Intravenous Q12H    Assessment/ Plan:   Active Problems:   Other  and unspecified angina pectoris   1. Coronary artery disease of native coronary arteries with angina pectoris: The patient is referred by Dr. Letta Moynahan for PCI. He was found to have 2 type stenosis in the circumflex system. He has 2 drug-eluting stents.  The patient does manual labor as an Clinical biochemist. He'll need to be out of work for one week. Will have her return to work on Saturday, 12/13/2013.  He'll follow up with Dr.Koneswaran in several weeks.   Discharge him on aspirin 81 mg a day Atorvastatin 40 mg a day Plavix 75 mg a day Atrovent as needed Isordil 20 mg 3 times a day Toprol-XL 25 mg twice a day Nitroglycerin as needed   Disposition: DC to home   Length of Stay: 1  Ramond Dial., MD, [facility] 12/06/2013, 8:47 AM Office [phone] Pager [phone]

## 2013-12-06 NOTE — Progress Notes (Signed)
Discharge Summary   Patient ID: Ryan Vaughn MRN: [id number], DOB/AGE: 61-Feb-1952 61 y.o. Admit date: 12/05/2013 D/C date:     12/06/2013  Primary Cardiologist: Dr. Bronson Ing  Principal Problem:   Unstable angina Active Problems:   S/P CABG (coronary artery bypass graft)   CAD (coronary artery disease)   HTN (hypertension)   Hyperlipidemia   Colon polyps   Non Hodgkin's lymphoma   Arthritis   Kidney stones   Discharge Diagnosis: Unstable angina s/p LHC on 12/05/13 with successful PCI w/ DES to OM 1 and DES mLCX  HPI: Ryan Vaughn is a 63 y.o. male with a history of CAD s/p CABG x1 at age 74 and multiple stents placed in 2009, HTN and HLD who was seen in the office on 12/03/13 for chest pain and admitted to [facility] on 12/05/13 for elective cardiac catheterization.    He had been experiencing chest pain, fatigue, and shortness of breath for which Dr Bronson Ing had been trying to control his symptoms by optimizing his medical therapy with short acting nitrates and long-acting beta blockers.  He was supposed to followup at a later date, but he has been experiencing significant chest tightness, dyspnea with minimal exertion, and progressive fatigue, and thus came in prior to his scheduled appointment. While he feels the increased dose of Isordil helped to control BP and symptoms to some degree, he has been significantly bothered and is scared. In spite of increasing isosorbide dinitrate to 20 mg tid, and increasing Toprol-XL to 50 mg q am and 25 mg q pm, his symptoms had significantly progressed. He had a normal stress echocardiogram in 02/2013.  He appeared to have CCS class III symptoms now and so it was elected to proceed with  coronary angiography.   Hospital Course  Unstable angina- He underwent LHC on 12/05/13 which revealed  Final Conclusions:  1. 3 vessel coronary artery disease with total occlusion of the proximal LAD, continued patency of the stented segment in the distal RCA, and  severe in-stent restenosis in the first OM branch and mid left circumflex  2. Status post single-vessel CABG with continued patency of the LIMA to LAD  3. Normal LV systolic function  4. Successful PCI of the first OM and mid circumflex using drug-eluting stent platforms.  Recommendations:  Continue long-term dual antiplatelet therapy with aspirin and Plavix, minimum of 12 months.   HTN: Low normal on current therapy.   Hyperlipidemia: total cholesterol 153, triglycerides 109, HDL 40, LDL 91. -- Continue Crestor 20 mg daily.    The patient has had an uncomplicated hospital course and is recovering well. The radial catheter site is stable. He has been seen by Dr. Acie Fredrickson today and deemed ready for discharge home. A voicemail has been left at the office for an appointment to be made in 1-2 weeks (closed on weekends). 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 A work excuse note was provided. Discharge medications include ASA 81mg  /Plavix 75 mg a day, Isordil 20 mg 3 times a day, Toprol-XL 25 mg twice a day, Crestor 20mg , and SL NTG as need   Discharge Vitals: Blood pressure 111/71, pulse 65, temperature 97.9 F (36.6 C), temperature source Oral, resp. rate 16, height 5\' 11"  (1.803 m), weight 230 lb (104.327 kg), SpO2 95.00%.  Labs: Lab Results  Component Value Date   WBC 5.6 12/06/2013   HGB 14.0 12/06/2013  HCT 42.8 12/06/2013   MCV 92.4 12/06/2013   PLT 169 12/06/2013     Recent Labs Lab 12/06/13 0348  NA 137  K 4.5  CL 99  CO2 28  BUN 14  CREATININE 1.00  CALCIUM 9.1  GLUCOSE 145*     Diagnostic Studies/Procedures   Cardiac Catheterization Procedure Note  Name: Ryan Vaughn  MRN: [id number]  DOB: [birth year]  Procedure: Left Heart Cath, Selective Coronary Angiography, LIMA angiography, LV angiography, PTCA and stenting of the mid-circumflex, PTCA and stenting of the OM1  Indication: CCS Class 3  angina, crescendo pattern  Procedural Details: The left wrist was prepped, draped, and anesthetized with 1% lidocaine. Using the modified Seldinger technique, a 5/6 French sheath was introduced into the left radial artery. 3 mg of verapamil was administered through the sheath, weight-based unfractionated heparin was administered intravenously. Standard Judkins catheters were used for selective coronary angiography and left ventriculography. Catheter exchanges were performed over an exchange length guidewire.  PROCEDURAL FINDINGS  Hemodynamics:  AO 106/65  LV 100/15  Coronary angiography:  Coronary dominance: right  Left mainstem: The left mainstem is patent with minor irregularity. The distal left mainstem has 20% stenosis.  Left anterior descending (LAD): The LAD is 100% occluded at its origin. The vessel fills from the mammary graft.  Left circumflex (LCx): The left circumflex is heavily stented. The first obtuse marginal has diffuse 90% stenosis. This appears to be diffuse in-stent restenosis. The AV circumflex beyond the first OM is heavily stented with approximately 3 stents noted. There is a 95% stenosis in the midcircumflex at the proximal edge of the distally stented area. There is a posterolateral branch without significant stenosis beyond the stented segment.  Right coronary artery (RCA): This is a dominant vessel. The RCA has diffuse plaquing without high-grade obstruction. The stented segment in the distal vessel is widely patent. The PDA branch is widely patent.  LIMA to LAD: Widely patent throughout. No obstructive disease. The vessel is tortuous. The distal anastomotic site is widely patent without significant stenosis. The septal perforators and diagonal branches are patent.  Left ventriculography: Left ventricular systolic function is normal, LVEF is estimated at 55-65%, there is no significant mitral regurgitation  PCI Note: Following the diagnostic procedure, the decision was made to  proceed with PCI. The first obtuse marginal and mid circumflex areas both had severe stenoses. Attention was first turned to the midcircumflex. Additional unfractionated heparin was administered. Once a therapeutic ACT was achieved, a 6 [place] XB LAD 3.5 cm guide catheter was inserted. A cougar coronary guidewire was used to cross the lesion. The lesion was predilated with a 2.5 x 12 mm balloon. The lesion was then stented with a 2.75 x 20 mm Promus premier drug-eluting stent. The midportion of the stent was clearly underexpanded. The stent was postdilated with a 3.0 x 15 mm noncompliant balloon to 22 atmospheres. It still appeared underexpanded to a 3.0 x 6 mm Noncompliant balloon was dilated to a maximum pressure of 24 atmospheres directly over the area of under expansion. I felt this was the best obtainable result as there was approximately 20% residual stenosis. Attention was then turned to the first obtuse marginal branch. That branch was wired with a second cougar wire. It was predilated with a 2.5 mm balloon to 8 atmospheres. The lesion was then stented with a 2.75 x 20 mm Promus premier DES deployed at 12 atmospheres. The stent was postdilated to 16 atmospheres with a 3.0 mm noncompliant balloon. Final  angiography confirmed an excellent result. The patient tolerated the procedure well. There were no immediate procedural complications. A TR band was used for radial hemostasis. The patient was transferred to the post catheterization recovery area for further monitoring.  PCI Data:  Lesion 1  Vessel - LCx/Segment - mid (ISR)  Percent Stenosis (pre) 95  TIMI-flow 3  Stent 2.75x20 mm Promus DES  Percent Stenosis (post) 20  TIMI-flow (post) 3  Lesion 2  Vessel - OM1/Segment - proximal (ISR)  Percent Stenosis (pre) 90  TIMI-flow 3  Stent 2.75x20 mm Promus DES  Percent Stenosis (post) 0  TIMI-flow (post) 3  Final Conclusions:  1. 3 vessel coronary artery disease with total occlusion of the proximal  LAD, continued patency of the stented segment in the distal RCA, and severe in-stent restenosis in the first OM branch and mid left circumflex  2. Status post single-vessel CABG with continued patency of the LIMA to LAD  3. Normal LV systolic function  4. Successful PCI of the first OM and mid circumflex using drug-eluting stent platforms.  Recommendations:  Continue long-term dual antiplatelet therapy with aspirin and Plavix, minimum of 12 months.      Discharge Medications     Medication List    STOP taking these medications       ibuprofen 800 MG tablet  Commonly known as:  ADVIL,MOTRIN      TAKE these medications       aspirin 81 MG tablet  Take 81 mg by mouth daily.     clopidogrel 75 MG tablet  Commonly known as:  PLAVIX  Take 75 mg by mouth daily.     ipratropium 0.06 % nasal spray  Commonly known as:  ATROVENT  Place 2 sprays into both nostrils 4 (four) times daily as needed for rhinitis.     isosorbide dinitrate 20 MG tablet  Commonly known as:  ISORDIL  Take 1 tablet (20 mg total) by mouth 3 (three) times daily.     metoprolol succinate 25 MG 24 hr tablet  Commonly known as:  TOPROL-XL  Take 1 tablet (25 mg total) by mouth 2 (two) times daily.     multivitamin tablet  Take 1 tablet by mouth daily.     rosuvastatin 20 MG tablet  Commonly known as:  CRESTOR  Take 20 mg by mouth daily.     vitamin B-12 500 MCG tablet  Commonly known as:  CYANOCOBALAMIN  Take 500 mcg by mouth daily.        Disposition   The patient will be discharged in stable condition to home.  Follow-up Information   Follow up with Herminio Commons, MD. (The office will call to make an appointment for you in 1 week. Please call them if you do not hear by Monday afternoon )    Specialty:  Cardiology   Contact information:   [address] [place] [address] [phone]         Duration of Discharge Encounter: Greater than 30 minutes including physician and PA  time.  SignedPerry Mount PA-C 12/06/2013, 9:39 AM   Attending Note:   The patient was seen and examined.  Agree with assessment and plan as noted above.  Changes made to the above note as needed.  See my note from earlier today.   Thayer Headings, Brooke Bonito., MD, [facility] 12/06/2013, 12:48 PM

## 2013-12-06 NOTE — Progress Notes (Signed)
Pt c/o headache 5/10 unrelieved by tylenol and percocet.  Also reports ongoing chest "tightness" since the cath, rates 4/10, non-radiating.  BP 108/74, EKG done, no change from previous EKG, SR 60's w/ PAC's.  Already on Imdur TID.  Dr Elias Else informed, order received, 2 mg morphine given slow IVP, pt dozed off to sleep and reports relief of both HA and chest tightness.  BP 102/62, PO meds staggered to prevent drastic drop in BP.  Left radial level 0.  Call light in reach, girlfriend at bedside.

## 2013-12-15 ENCOUNTER — Encounter: Payer: Self-pay | Admitting: Cardiovascular Disease

## 2013-12-15 ENCOUNTER — Ambulatory Visit (INDEPENDENT_AMBULATORY_CARE_PROVIDER_SITE_OTHER): Payer: BC Managed Care – PPO | Admitting: Cardiovascular Disease

## 2013-12-15 VITALS — BP 101/66 | HR 65 | Ht 71.0 in | Wt 234.0 lb

## 2013-12-15 DIAGNOSIS — Z951 Presence of aortocoronary bypass graft: Secondary | ICD-10-CM

## 2013-12-15 DIAGNOSIS — R51 Headache: Secondary | ICD-10-CM

## 2013-12-15 DIAGNOSIS — Z9289 Personal history of other medical treatment: Secondary | ICD-10-CM

## 2013-12-15 DIAGNOSIS — Z9861 Coronary angioplasty status: Secondary | ICD-10-CM

## 2013-12-15 DIAGNOSIS — I25810 Atherosclerosis of coronary artery bypass graft(s) without angina pectoris: Secondary | ICD-10-CM

## 2013-12-15 DIAGNOSIS — E785 Hyperlipidemia, unspecified: Secondary | ICD-10-CM

## 2013-12-15 DIAGNOSIS — R519 Headache, unspecified: Secondary | ICD-10-CM

## 2013-12-15 DIAGNOSIS — I10 Essential (primary) hypertension: Secondary | ICD-10-CM

## 2013-12-15 DIAGNOSIS — Z87898 Personal history of other specified conditions: Secondary | ICD-10-CM

## 2013-12-15 DIAGNOSIS — Z955 Presence of coronary angioplasty implant and graft: Secondary | ICD-10-CM

## 2013-12-15 NOTE — Patient Instructions (Signed)
   Stop Isosorbide Dinitrate Continue all other medications.   Referral to Cardiac Rehab Your physician wants you to follow up in:  3 months.  You will receive a reminder letter in the mail one-two months in advance.  If you don't receive a letter, please call our office to schedule the follow up appointment

## 2013-12-15 NOTE — Progress Notes (Signed)
Patient ID: Ryan Vaughn, male   DOB: Jun 26, 1950, 63 y.o.   MRN: [id number]      SUBJECTIVE: The patient is here to followup after undergoing coronary angiography and percutaneous coronary intervention. He successfully underwent PCI of the first OM and mid circumflex using drug-eluting stents.  He is here with his daughter, Ryan Vaughn, who is an Therapist, sports and works at [employer], and has numerous questions with regards to symptoms and returning to work. He initially had some chest pressure and palpitations within 48 hours after undergoing the procedure, but has not had any since. He has had headaches. He denies orthopnea, PND, lightheadedness and leg swelling. His job requires him to lift up to 70-80 pounds routinely. He does not currently feel up to doing this.     Allergies  Allergen Reactions  . Ivp Dye [Iodinated Diagnostic Agents] Other (See Comments)    headache    Current Outpatient Prescriptions  Medication Sig Dispense Refill  . aspirin 81 MG tablet Take 81 mg by mouth daily.      . clopidogrel (PLAVIX) 75 MG tablet Take 75 mg by mouth daily.      Marland Kitchen ipratropium (ATROVENT) 0.06 % nasal spray Place 2 sprays into both nostrils 4 (four) times daily as needed for rhinitis.      Marland Kitchen isosorbide dinitrate (ISORDIL) 20 MG tablet Take 1 tablet (20 mg total) by mouth 3 (three) times daily.  90 tablet  3  . metoprolol succinate (TOPROL-XL) 25 MG 24 hr tablet Take 1 tablet (25 mg total) by mouth 2 (two) times daily.  60 tablet  11  . Multiple Vitamin (MULTIVITAMIN) tablet Take 1 tablet by mouth daily.      . rosuvastatin (CRESTOR) 20 MG tablet Take 20 mg by mouth daily.      . vitamin B-12 (CYANOCOBALAMIN) 500 MCG tablet Take 500 mcg by mouth daily.       No current facility-administered medications for this visit.    Past Medical History  Diagnosis Date  . Essential hypertension, benign   . Mixed hyperlipidemia   . Personal history of colonic polyps   . Lumbago   . Kidney stones   .  Arthritis   . Non Hodgkin's lymphoma 2002    a. stage IV  . CAD (coronary artery disease)     a. DES to OM 1 and DES mLCX (12/05/13) b. CABG x1  ('92) c. 6 stents placed in 2009? (pt hx)     Past Surgical History  Procedure Laterality Date  . Coronary artery bypass graft  1992    "CABG X1"  . Coronary angioplasty with stent placement  ~ 1999; ~ 2009 X 2; 12/05/2013    "1 +3 + 2 + 2"  . Tonsillectomy    . Inguinal hernia repair    . Exploratory laparotomy with abdominal mass excision  08/2000    "looking for cancer"  . Cholecystectomy  08/2000  . Cystostomy w/ stent insertion    . Cystoscopy w/ ureteroscopy w/ lithotripsy      History   Social History  . Marital Status: Single    Spouse Name: N/A    Number of Children: N/A  . Years of Education: N/A   Occupational History  . Not on file.   Social History Main Topics  . Smoking status: Never Smoker   . Smokeless tobacco: Current User    Types: Snuff     Comment: 12/05/2013 "use snuff q now and again"  . Alcohol  Use: Yes     Comment: 12/05/2013 "maybe 2 cans beer/yr"  . Drug Use: No  . Sexual Activity: Yes   Other Topics Concern  . Not on file   Social History Narrative  . No narrative on file    BP 101/66 Pulse 65    PHYSICAL EXAM General: NAD Neck: No JVD, no thyromegaly. Lungs: Clear to auscultation bilaterally with normal respiratory effort. CV: Nondisplaced PMI.  Regular rate and rhythm, normal S1/S2, no S3/S4, no murmur. No pretibial or periankle edema.  No carotid bruit.  Normal pedal pulses.  Abdomen: Soft, nontender, no hepatosplenomegaly, no distention.  Neurologic: Alert and oriented x 3.  Psych: Normal affect. Extremities: No clubbing or cyanosis.   ECG: reviewed and available in electronic records.      ASSESSMENT AND PLAN: 1. CAD s/p 1-v CABG s/p PCI of first OM and mid LCx: Symptomatically stable and doing much better. Continue ASA, Toprol-XL, Plavix (multiple stents and tolerating this  medication) and Crestor. Will d/c isosorbide dinitrate. Will make a referral to cardiac rehab. I recommend he not lift anything more than 25-30 lbs for the next month. Further recommendations will be based upon evaluation at cardiac rehab. 2. HTN: Low normal today on current therapy. This will likely increase after discontinuation of nitrates. 3. Hyperlipidemia: Well controlled. Continue Crestor 20 mg daily.   Dispo: f/u 3 months.  Kate Sable, M.D., F.A.C.C.

## 2013-12-16 ENCOUNTER — Other Ambulatory Visit: Payer: Self-pay | Admitting: *Deleted

## 2013-12-16 MED ORDER — METOPROLOL SUCCINATE ER 25 MG PO TB24: 25.0000 mg | ORAL_TABLET | Freq: Two times a day (BID) | ORAL | Status: DC

## 2013-12-17 ENCOUNTER — Ambulatory Visit: Payer: Self-pay | Admitting: Cardiovascular Disease

## 2014-01-01 ENCOUNTER — Encounter: Payer: Self-pay | Admitting: Cardiovascular Disease

## 2014-01-01 ENCOUNTER — Ambulatory Visit (INDEPENDENT_AMBULATORY_CARE_PROVIDER_SITE_OTHER): Payer: BC Managed Care – PPO | Admitting: Cardiovascular Disease

## 2014-01-01 VITALS — BP 105/72 | HR 62 | Ht 71.0 in | Wt 231.0 lb

## 2014-01-01 DIAGNOSIS — I25810 Atherosclerosis of coronary artery bypass graft(s) without angina pectoris: Secondary | ICD-10-CM

## 2014-01-01 DIAGNOSIS — I10 Essential (primary) hypertension: Secondary | ICD-10-CM

## 2014-01-01 DIAGNOSIS — Z955 Presence of coronary angioplasty implant and graft: Secondary | ICD-10-CM

## 2014-01-01 DIAGNOSIS — Z9861 Coronary angioplasty status: Secondary | ICD-10-CM

## 2014-01-01 DIAGNOSIS — E785 Hyperlipidemia, unspecified: Secondary | ICD-10-CM

## 2014-01-01 NOTE — Progress Notes (Signed)
Patient ID: Ryan Vaughn, male   DOB: 1950-12-23, 63 y.o.   MRN: [id number]      SUBJECTIVE: The patient is here to follow up for CAD. He successfully underwent PCI of the first OM and mid circumflex using drug-eluting stents.  His daughter, [name], is an Therapist, sports and works at [employer].  He has been doing well and is slowly getting back into his regular activities. He has not gone back to work yet. He is not certain he will be able to do cardiac rehab as he is concerned he may lose his job.  He tells me his employer is quite unreasonable when it comes to health-related issues.  The patient denies chest pain and shortness of breath, and says he feels "so much better" after stent placement.    Review of Systems: As per "subjective", otherwise negative.  Allergies  Allergen Reactions  . Ivp Dye [Iodinated Diagnostic Agents] Other (See Comments)    headache    Current Outpatient Prescriptions  Medication Sig Dispense Refill  . aspirin 81 MG tablet Take 81 mg by mouth daily.      . clopidogrel (PLAVIX) 75 MG tablet Take 75 mg by mouth daily.      . cyclobenzaprine (FLEXERIL) 10 MG tablet Take 10 mg by mouth 3 (three) times daily as needed for muscle spasms.      Marland Kitchen ipratropium (ATROVENT) 0.06 % nasal spray Place 2 sprays into both nostrils 4 (four) times daily as needed for rhinitis.      . metoprolol succinate (TOPROL-XL) 25 MG 24 hr tablet Take 1 tablet (25 mg total) by mouth 2 (two) times daily.  180 tablet  3  . Multiple Vitamin (MULTIVITAMIN) tablet Take 1 tablet by mouth daily.      . rosuvastatin (CRESTOR) 20 MG tablet Take 20 mg by mouth daily.      . vitamin B-12 (CYANOCOBALAMIN) 500 MCG tablet Take 500 mcg by mouth daily.       No current facility-administered medications for this visit.    Past Medical History  Diagnosis Date  . Essential hypertension, benign   . Mixed hyperlipidemia   . Personal history of colonic polyps   . Lumbago   . Kidney stones   . Arthritis     . Non Hodgkin's lymphoma 2002    a. stage IV  . CAD (coronary artery disease)     a. DES to OM 1 and DES mLCX (12/05/13) b. CABG x1  ('92) c. 6 stents placed in 2009? (pt hx)     Past Surgical History  Procedure Laterality Date  . Coronary artery bypass graft  1992    "CABG X1"  . Coronary angioplasty with stent placement  ~ 1999; ~ 2009 X 2; 12/05/2013    "1 +3 + 2 + 2"  . Tonsillectomy    . Inguinal hernia repair    . Exploratory laparotomy with abdominal mass excision  08/2000    "looking for cancer"  . Cholecystectomy  08/2000  . Cystostomy w/ stent insertion    . Cystoscopy w/ ureteroscopy w/ lithotripsy      History   Social History  . Marital Status: Single    Spouse Name: N/A    Number of Children: N/A  . Years of Education: N/A   Occupational History  . Not on file.   Social History Main Topics  . Smoking status: Never Smoker   . Smokeless tobacco: Current User    Types: Snuff  Comment: 12/05/2013 "use snuff q now and again"  . Alcohol Use: Yes     Comment: 12/05/2013 "maybe 2 cans beer/yr"  . Drug Use: No  . Sexual Activity: Yes   Other Topics Concern  . Not on file   Social History Narrative  . No narrative on file     Filed Vitals:   01/01/14 0855  BP: 105/72  Pulse: 62  Height: 5\' 11"  (1.803 m)  Weight: 231 lb (104.781 kg)  SpO2: 98%    PHYSICAL EXAM General: NAD Neck: No JVD, no thyromegaly. Lungs: Clear to auscultation bilaterally with normal respiratory effort. CV: Nondisplaced PMI.  Regular rate and rhythm, normal S1/S2, no S3/S4, no murmur. No pretibial or periankle edema.  No carotid bruit.  Normal pedal pulses.  Abdomen: Soft, nontender, no hepatosplenomegaly, no distention.  Neurologic: Alert and oriented x 3.  Psych: Normal affect. Extremities: No clubbing or cyanosis.   ECG: reviewed and available in electronic records.      ASSESSMENT AND PLAN: 1. CAD s/p 1-v CABG s/p PCI of first OM and mid LCx: Symptomatically  stable. Continue ASA, Toprol-XL, Plavix (multiple stents and tolerating this medication) and Crestor.   2. Essential HTN: Low normal today on current therapy.   3. Hyperlipidemia: Well controlled. Continue Crestor 20 mg daily.   Dispo: f/u 3-4 months.   Kate Sable, M.D., F.A.C.C.

## 2014-01-01 NOTE — Patient Instructions (Signed)
Continue all current medications. Your physician wants you to follow up in:  3-4 months.  You will receive a reminder letter in the mail one-two months in advance.  If you don't receive a letter, please call our office to schedule the follow up appointment

## 2014-01-05 ENCOUNTER — Telehealth: Payer: Self-pay | Admitting: *Deleted

## 2014-01-05 NOTE — Telephone Encounter (Signed)
Spoke with MD and he said it was okay for patient to have MRI of shoulder.

## 2014-01-15 ENCOUNTER — Telehealth: Payer: Self-pay | Admitting: Cardiology

## 2014-01-15 NOTE — Telephone Encounter (Signed)
PATIENT came in questioning to pick up letter stating that his restrictions has been released for him to return to work. He said that he was told to come in prior to Aug 27th to have this done by Dr Bronson Ing

## 2014-01-15 NOTE — Telephone Encounter (Signed)
Discussed below with patient.  Do not see any thing scanned into EPIC or in office note stating this.  Patient stated that Dr. Bronson Ing filled out paper (1 page) for him in the office.  Asked patient to bring copy of this paper to [place] in the morning & I will send MD a message.   Will try to work this out for him as he is requesting this documentation ASAP.

## 2014-01-16 ENCOUNTER — Encounter: Payer: Self-pay | Admitting: *Deleted

## 2014-01-16 NOTE — Telephone Encounter (Signed)
Call placed to Dr. Bronson Ing - MD did give the okay to give patient letter to lift his weight restrictions.  See letter in [facility].  Patient did bring copy of other form & will be scanned into EPIC.    Patient picked up letter this afternoon.

## 2014-01-20 ENCOUNTER — Encounter (HOSPITAL_COMMUNITY)
Admission: RE | Admit: 2014-01-20 | Discharge: 2014-01-20 | Disposition: A | Discharge status: Home / Self Care | Payer: BC Managed Care – PPO | Source: Ambulatory Visit | Attending: Cardiovascular Disease | Admitting: Cardiovascular Disease

## 2014-01-20 ENCOUNTER — Encounter (HOSPITAL_COMMUNITY): Payer: Self-pay

## 2014-01-20 VITALS — BP 100/70 | HR 77 | Ht 70.0 in | Wt 233.7 lb

## 2014-01-20 DIAGNOSIS — Z951 Presence of aortocoronary bypass graft: Secondary | ICD-10-CM | POA: Insufficient documentation

## 2014-01-20 DIAGNOSIS — Z9861 Coronary angioplasty status: Secondary | ICD-10-CM

## 2014-01-20 DIAGNOSIS — I2510 Atherosclerotic heart disease of native coronary artery without angina pectoris: Secondary | ICD-10-CM | POA: Insufficient documentation

## 2014-01-20 DIAGNOSIS — Z5189 Encounter for other specified aftercare: Secondary | ICD-10-CM | POA: Insufficient documentation

## 2014-01-20 NOTE — Patient Instructions (Signed)
Pt has finished orientation and is scheduled to start CR on 02/02/14 at 8:15. Pt has been instructed to arrive to class 15 minutes early for scheduled class. Pt has been instructed to wear comfortable clothing and shoes with rubber soles. Pt has been told to take their medications 1 hour prior to coming to class.  If the patient is not going to attend class, he/she has been instructed to call.

## 2014-01-20 NOTE — Progress Notes (Signed)
Patient was referred to CR by Dr. Bronson Ing post Stent placement V45.82. During orientation advised patient on arrival and appointment times what to wear, what to do before, during and after exercise. Reviewed attendance and class policy. Talked about inclement weather and class consultation policy. Pt is scheduled to start Cardiac Rehab on 02/02/14 at 8:15am. Pt was advised to come to class 5 minutes before class starts. He was also given instructions on meeting with the dietician and attending the Family Structure classes. Pt is eager to get started. Patient was able to complete 6 minute walk test.

## 2014-02-02 ENCOUNTER — Encounter (HOSPITAL_COMMUNITY)
Admission: RE | Admit: 2014-02-02 | Discharge: 2014-02-02 | Disposition: A | Discharge status: Home / Self Care | Payer: BC Managed Care – PPO | Source: Ambulatory Visit | Attending: Cardiovascular Disease | Admitting: Cardiovascular Disease

## 2014-02-02 DIAGNOSIS — Z951 Presence of aortocoronary bypass graft: Secondary | ICD-10-CM | POA: Diagnosis not present

## 2014-02-02 DIAGNOSIS — I2510 Atherosclerotic heart disease of native coronary artery without angina pectoris: Secondary | ICD-10-CM | POA: Diagnosis not present

## 2014-02-02 DIAGNOSIS — Z5189 Encounter for other specified aftercare: Secondary | ICD-10-CM | POA: Diagnosis present

## 2014-02-02 DIAGNOSIS — Z9861 Coronary angioplasty status: Secondary | ICD-10-CM | POA: Diagnosis not present

## 2014-02-04 ENCOUNTER — Encounter (HOSPITAL_COMMUNITY)
Admission: RE | Admit: 2014-02-04 | Discharge: 2014-02-04 | Disposition: A | Discharge status: Home / Self Care | Payer: BC Managed Care – PPO | Source: Ambulatory Visit | Attending: Cardiovascular Disease | Admitting: Cardiovascular Disease

## 2014-02-04 DIAGNOSIS — Z5189 Encounter for other specified aftercare: Secondary | ICD-10-CM | POA: Diagnosis not present

## 2014-02-06 ENCOUNTER — Encounter (HOSPITAL_COMMUNITY)
Admission: RE | Admit: 2014-02-06 | Discharge: 2014-02-06 | Disposition: A | Discharge status: Home / Self Care | Payer: BC Managed Care – PPO | Source: Ambulatory Visit | Attending: Cardiovascular Disease | Admitting: Cardiovascular Disease

## 2014-02-06 DIAGNOSIS — Z5189 Encounter for other specified aftercare: Secondary | ICD-10-CM | POA: Diagnosis not present

## 2014-02-09 ENCOUNTER — Encounter (HOSPITAL_COMMUNITY)
Admission: RE | Admit: 2014-02-09 | Discharge: 2014-02-09 | Disposition: A | Discharge status: Home / Self Care | Payer: BC Managed Care – PPO | Source: Ambulatory Visit | Attending: Cardiovascular Disease | Admitting: Cardiovascular Disease

## 2014-02-09 DIAGNOSIS — Z5189 Encounter for other specified aftercare: Secondary | ICD-10-CM | POA: Diagnosis not present

## 2014-02-11 ENCOUNTER — Encounter (HOSPITAL_COMMUNITY)
Admission: RE | Admit: 2014-02-11 | Discharge: 2014-02-11 | Disposition: A | Discharge status: Home / Self Care | Payer: BC Managed Care – PPO | Source: Ambulatory Visit | Attending: Cardiovascular Disease | Admitting: Cardiovascular Disease

## 2014-02-11 DIAGNOSIS — Z5189 Encounter for other specified aftercare: Secondary | ICD-10-CM | POA: Diagnosis not present

## 2014-02-13 ENCOUNTER — Encounter (HOSPITAL_COMMUNITY)
Admission: RE | Admit: 2014-02-13 | Discharge: 2014-02-13 | Disposition: A | Discharge status: Home / Self Care | Payer: BC Managed Care – PPO | Source: Ambulatory Visit | Attending: Cardiovascular Disease | Admitting: Cardiovascular Disease

## 2014-02-13 DIAGNOSIS — Z5189 Encounter for other specified aftercare: Secondary | ICD-10-CM | POA: Diagnosis not present

## 2014-02-16 ENCOUNTER — Encounter (HOSPITAL_COMMUNITY)
Admission: RE | Admit: 2014-02-16 | Discharge: 2014-02-16 | Disposition: A | Discharge status: Home / Self Care | Payer: BC Managed Care – PPO | Source: Ambulatory Visit | Attending: Cardiovascular Disease | Admitting: Cardiovascular Disease

## 2014-02-16 DIAGNOSIS — Z5189 Encounter for other specified aftercare: Secondary | ICD-10-CM | POA: Diagnosis not present

## 2014-02-18 ENCOUNTER — Encounter (HOSPITAL_COMMUNITY)
Admission: RE | Admit: 2014-02-18 | Discharge: 2014-02-18 | Disposition: A | Discharge status: Home / Self Care | Payer: BC Managed Care – PPO | Source: Ambulatory Visit | Attending: Cardiovascular Disease | Admitting: Cardiovascular Disease

## 2014-02-18 DIAGNOSIS — Z5189 Encounter for other specified aftercare: Secondary | ICD-10-CM | POA: Diagnosis not present

## 2014-02-20 ENCOUNTER — Encounter (HOSPITAL_COMMUNITY)
Admission: RE | Admit: 2014-02-20 | Discharge: 2014-02-20 | Disposition: A | Discharge status: Home / Self Care | Payer: BC Managed Care – PPO | Source: Ambulatory Visit | Attending: Cardiovascular Disease | Admitting: Cardiovascular Disease

## 2014-02-20 DIAGNOSIS — Z955 Presence of coronary angioplasty implant and graft: Secondary | ICD-10-CM | POA: Insufficient documentation

## 2014-02-20 DIAGNOSIS — Z951 Presence of aortocoronary bypass graft: Secondary | ICD-10-CM | POA: Insufficient documentation

## 2014-02-20 DIAGNOSIS — I2510 Atherosclerotic heart disease of native coronary artery without angina pectoris: Secondary | ICD-10-CM | POA: Insufficient documentation

## 2014-02-23 ENCOUNTER — Encounter (HOSPITAL_COMMUNITY)
Admission: RE | Admit: 2014-02-23 | Discharge: 2014-02-23 | Disposition: A | Discharge status: Home / Self Care | Payer: BC Managed Care – PPO | Source: Ambulatory Visit | Attending: Cardiovascular Disease | Admitting: Cardiovascular Disease

## 2014-02-23 DIAGNOSIS — I2510 Atherosclerotic heart disease of native coronary artery without angina pectoris: Secondary | ICD-10-CM | POA: Diagnosis not present

## 2014-02-25 ENCOUNTER — Encounter (HOSPITAL_COMMUNITY)
Admission: RE | Admit: 2014-02-25 | Discharge: 2014-02-25 | Disposition: A | Discharge status: Home / Self Care | Payer: BC Managed Care – PPO | Source: Ambulatory Visit | Attending: Cardiovascular Disease | Admitting: Cardiovascular Disease

## 2014-02-25 DIAGNOSIS — I2510 Atherosclerotic heart disease of native coronary artery without angina pectoris: Secondary | ICD-10-CM | POA: Diagnosis not present

## 2014-02-27 ENCOUNTER — Encounter (HOSPITAL_COMMUNITY)
Admission: RE | Admit: 2014-02-27 | Discharge: 2014-02-27 | Disposition: A | Discharge status: Home / Self Care | Payer: BC Managed Care – PPO | Source: Ambulatory Visit | Attending: Cardiovascular Disease | Admitting: Cardiovascular Disease

## 2014-02-27 DIAGNOSIS — I2510 Atherosclerotic heart disease of native coronary artery without angina pectoris: Secondary | ICD-10-CM | POA: Diagnosis not present

## 2014-03-02 ENCOUNTER — Encounter (HOSPITAL_COMMUNITY)
Admission: RE | Admit: 2014-03-02 | Discharge: 2014-03-02 | Disposition: A | Discharge status: Home / Self Care | Payer: BC Managed Care – PPO | Source: Ambulatory Visit | Attending: Cardiovascular Disease | Admitting: Cardiovascular Disease

## 2014-03-02 DIAGNOSIS — I2510 Atherosclerotic heart disease of native coronary artery without angina pectoris: Secondary | ICD-10-CM | POA: Diagnosis not present

## 2014-03-04 ENCOUNTER — Encounter (HOSPITAL_COMMUNITY): Payer: BC Managed Care – PPO

## 2014-03-06 ENCOUNTER — Encounter (HOSPITAL_COMMUNITY)
Admission: RE | Admit: 2014-03-06 | Discharge: 2014-03-06 | Disposition: A | Discharge status: Home / Self Care | Payer: BC Managed Care – PPO | Source: Ambulatory Visit | Attending: Cardiovascular Disease | Admitting: Cardiovascular Disease

## 2014-03-06 DIAGNOSIS — I2510 Atherosclerotic heart disease of native coronary artery without angina pectoris: Secondary | ICD-10-CM | POA: Diagnosis not present

## 2014-03-09 ENCOUNTER — Encounter (HOSPITAL_COMMUNITY)
Admission: RE | Admit: 2014-03-09 | Discharge: 2014-03-09 | Disposition: A | Discharge status: Home / Self Care | Payer: BC Managed Care – PPO | Source: Ambulatory Visit | Attending: Cardiovascular Disease | Admitting: Cardiovascular Disease

## 2014-03-09 DIAGNOSIS — I2510 Atherosclerotic heart disease of native coronary artery without angina pectoris: Secondary | ICD-10-CM | POA: Diagnosis not present

## 2014-03-10 NOTE — Progress Notes (Signed)
Cardiac Rehabilitation Program Outcomes Report   Orientation:  01/20/14 Graduate Date:  tbd Discharge Date:  tbd # of sessions completed: 3 sessions  Cardiologist: Bronson Ing Family MD:  Burdine Class Time:  0815  A.  Exercise Program:  Tolerates exercise @ 3.1 METS for 15 minutes and Walk Test Results:  Pre: 3.3 mets  B.  Mental Health:  Good mental attitude  C.  Education/Instruction/Skills  Accurately checks own pulse.  Rest:  71  Exercise:  89 and Uses Perceived Exertion Scale and/or Dyspnea Scale  Uses Perceived Exertion Scale and/or Dyspnea Scale  D.  Nutrition/Weight Control/Body Composition:  Adherence to prescribed nutrition program: good    E.  Blood Lipids    No results found for this basename: CHOL, HDL, LDLCALC, LDLDIRECT, TRIG, CHOLHDL    F.  Lifestyle Changes:  Making positive lifestyle changes  G.  Symptoms noted with exercise:  Asymptomatic  Report Completed By:  Benay Pillow RN   Comments:  First progress note.

## 2014-03-11 ENCOUNTER — Encounter (HOSPITAL_COMMUNITY)
Admission: RE | Admit: 2014-03-11 | Discharge: 2014-03-11 | Disposition: A | Discharge status: Home / Self Care | Payer: BC Managed Care – PPO | Source: Ambulatory Visit | Attending: Cardiovascular Disease | Admitting: Cardiovascular Disease

## 2014-03-11 DIAGNOSIS — I2510 Atherosclerotic heart disease of native coronary artery without angina pectoris: Secondary | ICD-10-CM | POA: Diagnosis not present

## 2014-03-13 ENCOUNTER — Encounter (HOSPITAL_COMMUNITY)
Admission: RE | Admit: 2014-03-13 | Discharge: 2014-03-13 | Disposition: A | Discharge status: Home / Self Care | Payer: BC Managed Care – PPO | Source: Ambulatory Visit | Attending: Cardiovascular Disease | Admitting: Cardiovascular Disease

## 2014-03-13 DIAGNOSIS — I2510 Atherosclerotic heart disease of native coronary artery without angina pectoris: Secondary | ICD-10-CM | POA: Diagnosis not present

## 2014-03-16 ENCOUNTER — Encounter (HOSPITAL_COMMUNITY)
Admission: RE | Admit: 2014-03-16 | Discharge: 2014-03-16 | Disposition: A | Discharge status: Home / Self Care | Payer: BC Managed Care – PPO | Source: Ambulatory Visit | Attending: Cardiovascular Disease | Admitting: Cardiovascular Disease

## 2014-03-16 DIAGNOSIS — I2510 Atherosclerotic heart disease of native coronary artery without angina pectoris: Secondary | ICD-10-CM | POA: Diagnosis not present

## 2014-03-17 ENCOUNTER — Ambulatory Visit: Payer: Self-pay | Admitting: Cardiovascular Disease

## 2014-03-18 ENCOUNTER — Encounter (HOSPITAL_COMMUNITY)
Admission: RE | Admit: 2014-03-18 | Discharge: 2014-03-18 | Disposition: A | Discharge status: Home / Self Care | Payer: BC Managed Care – PPO | Source: Ambulatory Visit | Attending: Cardiovascular Disease | Admitting: Cardiovascular Disease

## 2014-03-18 DIAGNOSIS — I2510 Atherosclerotic heart disease of native coronary artery without angina pectoris: Secondary | ICD-10-CM | POA: Diagnosis not present

## 2014-03-20 ENCOUNTER — Encounter (HOSPITAL_COMMUNITY)
Admission: RE | Admit: 2014-03-20 | Discharge: 2014-03-20 | Disposition: A | Discharge status: Home / Self Care | Payer: BC Managed Care – PPO | Source: Ambulatory Visit | Attending: Cardiovascular Disease | Admitting: Cardiovascular Disease

## 2014-03-20 DIAGNOSIS — I2510 Atherosclerotic heart disease of native coronary artery without angina pectoris: Secondary | ICD-10-CM | POA: Diagnosis not present

## 2014-03-20 NOTE — Progress Notes (Signed)
Cardiac Rehabilitation Program Outcomes Report   Orientation:  01/20/14 Graduate Date:  tbd Discharge Date:  tbd # of sessions completed: 18  Cardiologist: Gaynelle Cage MD:  Burdine Class Time:  0815  A.  Exercise Program:  Tolerates exercise @ 3.69 METS for 15 minutes  B.  Mental Health:  Good mental attitude  C.  Education/Instruction/Skills  Accurately checks own pulse.  Rest:  67  Exercise:  93 and Knows THR for exercise. Has attended over 10 education classes.   Uses Perceived Exertion Scale and/or Dyspnea Scale  D.  Nutrition/Weight Control/Body Composition:  Adherence to prescribed nutrition program: good    E.  Blood Lipids    No results found for this basename: CHOL, HDL, LDLCALC, LDLDIRECT, TRIG, CHOLHDL    F.  Lifestyle Changes:  Making positive lifestyle changes  G.  Symptoms noted with exercise:  Asymptomatic  Report Completed By:  Benay Pillow RN   Comments:  Halfway progress note.

## 2014-03-23 ENCOUNTER — Encounter (HOSPITAL_COMMUNITY)
Admission: RE | Admit: 2014-03-23 | Discharge: 2014-03-23 | Disposition: A | Discharge status: Home / Self Care | Payer: BC Managed Care – PPO | Source: Ambulatory Visit | Attending: Cardiovascular Disease | Admitting: Cardiovascular Disease

## 2014-03-23 DIAGNOSIS — I2510 Atherosclerotic heart disease of native coronary artery without angina pectoris: Secondary | ICD-10-CM | POA: Diagnosis present

## 2014-03-23 DIAGNOSIS — Z955 Presence of coronary angioplasty implant and graft: Secondary | ICD-10-CM | POA: Diagnosis not present

## 2014-03-23 DIAGNOSIS — Z7902 Long term (current) use of antithrombotics/antiplatelets: Secondary | ICD-10-CM | POA: Diagnosis not present

## 2014-03-25 ENCOUNTER — Encounter (HOSPITAL_COMMUNITY): Payer: BC Managed Care – PPO

## 2014-03-25 DIAGNOSIS — I2510 Atherosclerotic heart disease of native coronary artery without angina pectoris: Secondary | ICD-10-CM | POA: Diagnosis not present

## 2014-03-27 ENCOUNTER — Encounter (HOSPITAL_COMMUNITY)
Admission: RE | Admit: 2014-03-27 | Discharge: 2014-03-27 | Disposition: A | Discharge status: Home / Self Care | Payer: BC Managed Care – PPO | Source: Ambulatory Visit | Attending: Cardiovascular Disease | Admitting: Cardiovascular Disease

## 2014-03-27 DIAGNOSIS — I2510 Atherosclerotic heart disease of native coronary artery without angina pectoris: Secondary | ICD-10-CM | POA: Diagnosis not present

## 2014-03-30 ENCOUNTER — Encounter (HOSPITAL_COMMUNITY)
Admission: RE | Admit: 2014-03-30 | Discharge: 2014-03-30 | Disposition: A | Discharge status: Home / Self Care | Payer: BC Managed Care – PPO | Source: Ambulatory Visit | Attending: Cardiovascular Disease | Admitting: Cardiovascular Disease

## 2014-03-30 DIAGNOSIS — I2510 Atherosclerotic heart disease of native coronary artery without angina pectoris: Secondary | ICD-10-CM | POA: Diagnosis not present

## 2014-04-01 ENCOUNTER — Encounter (HOSPITAL_COMMUNITY): Payer: BC Managed Care – PPO

## 2014-04-03 ENCOUNTER — Ambulatory Visit (INDEPENDENT_AMBULATORY_CARE_PROVIDER_SITE_OTHER): Payer: BC Managed Care – PPO | Admitting: Cardiovascular Disease

## 2014-04-03 ENCOUNTER — Encounter: Payer: Self-pay | Admitting: Cardiovascular Disease

## 2014-04-03 ENCOUNTER — Ambulatory Visit (INDEPENDENT_AMBULATORY_CARE_PROVIDER_SITE_OTHER): Payer: BC Managed Care – PPO | Admitting: *Deleted

## 2014-04-03 ENCOUNTER — Encounter (HOSPITAL_COMMUNITY)
Admission: RE | Admit: 2014-04-03 | Discharge: 2014-04-03 | Disposition: A | Discharge status: Home / Self Care | Payer: BC Managed Care – PPO | Source: Ambulatory Visit | Attending: Cardiovascular Disease | Admitting: Cardiovascular Disease

## 2014-04-03 VITALS — BP 100/62 | HR 70 | Ht 71.0 in | Wt 235.0 lb

## 2014-04-03 DIAGNOSIS — I25810 Atherosclerosis of coronary artery bypass graft(s) without angina pectoris: Secondary | ICD-10-CM

## 2014-04-03 DIAGNOSIS — E785 Hyperlipidemia, unspecified: Secondary | ICD-10-CM

## 2014-04-03 DIAGNOSIS — Z23 Encounter for immunization: Secondary | ICD-10-CM

## 2014-04-03 DIAGNOSIS — Z955 Presence of coronary angioplasty implant and graft: Secondary | ICD-10-CM

## 2014-04-03 DIAGNOSIS — I10 Essential (primary) hypertension: Secondary | ICD-10-CM

## 2014-04-03 DIAGNOSIS — I2510 Atherosclerotic heart disease of native coronary artery without angina pectoris: Secondary | ICD-10-CM | POA: Diagnosis not present

## 2014-04-03 NOTE — Patient Instructions (Signed)
Continue all current medications. Your physician wants you to follow up in: 6 months.  You will receive a reminder letter in the mail one-two months in advance.  If you don't receive a letter, please call our office to schedule the follow up appointment   

## 2014-04-03 NOTE — Progress Notes (Signed)
Patient ID: Ryan Vaughn, male   DOB: 1950-07-09, 63 y.o.   MRN: [id number]      SUBJECTIVE: The patient is here to follow up for CAD. He successfully underwent PCI of the first OM and mid circumflex using drug-eluting stents on 12/05/13.  His daughter, [name], is an Therapist, sports and works at [employer].  When asked about cardiac rehabilitation he said, "I love it. I just love it". The patient denies any symptoms of chest pain, palpitations, shortness of breath, lightheadedness, dizziness, leg swelling, orthopnea, PND, and syncope.    Review of Systems: As per "subjective", otherwise negative.  Allergies  Allergen Reactions  . Ivp Dye [Iodinated Diagnostic Agents] Other (See Comments)    headache    Current Outpatient Prescriptions  Medication Sig Dispense Refill  . aspirin 81 MG tablet Take 81 mg by mouth daily.    . clopidogrel (PLAVIX) 75 MG tablet Take 75 mg by mouth daily.    . cyclobenzaprine (FLEXERIL) 10 MG tablet Take 10 mg by mouth 3 (three) times daily as needed for muscle spasms.    Marland Kitchen ipratropium (ATROVENT) 0.06 % nasal spray Place 2 sprays into both nostrils 4 (four) times daily as needed for rhinitis.    . metoprolol succinate (TOPROL-XL) 25 MG 24 hr tablet Take 1 tablet (25 mg total) by mouth 2 (two) times daily. 180 tablet 3  . Multiple Vitamin (MULTIVITAMIN) tablet Take 1 tablet by mouth daily.    . rosuvastatin (CRESTOR) 20 MG tablet Take 20 mg by mouth daily.    . vitamin B-12 (CYANOCOBALAMIN) 500 MCG tablet Take 500 mcg by mouth daily.     No current facility-administered medications for this visit.    Past Medical History  Diagnosis Date  . Essential hypertension, benign   . Mixed hyperlipidemia   . Personal history of colonic polyps   . Lumbago   . Kidney stones   . Arthritis   . Non Hodgkin's lymphoma 2002    a. stage IV  . CAD (coronary artery disease)     a. DES to OM 1 and DES mLCX (12/05/13) b. CABG x1  ('92) c. 6 stents placed in 2009? (pt hx)      Past Surgical History  Procedure Laterality Date  . Coronary artery bypass graft  1992    "CABG X1"  . Coronary angioplasty with stent placement  ~ 1999; ~ 2009 X 2; 12/05/2013    "1 +3 + 2 + 2"  . Tonsillectomy    . Inguinal hernia repair    . Exploratory laparotomy with abdominal mass excision  08/2000    "looking for cancer"  . Cholecystectomy  08/2000  . Cystostomy w/ stent insertion    . Cystoscopy w/ ureteroscopy w/ lithotripsy      History   Social History  . Marital Status: Single    Spouse Name: N/A    Number of Children: N/A  . Years of Education: N/A   Occupational History  . Not on file.   Social History Main Topics  . Smoking status: Never Smoker   . Smokeless tobacco: Current User    Types: Snuff     Comment: 12/05/2013 "use snuff q now and again"  . Alcohol Use: 0.0 oz/week    0 Not specified per week     Comment: 12/05/2013 "maybe 2 cans beer/yr"  . Drug Use: No  . Sexual Activity: Yes   Other Topics Concern  . Not on file   Social History Narrative  Filed Vitals:   04/03/14 1254  Height: 5\' 11"  (1.803 m)  Weight: 235 lb (106.595 kg)   BP 100/62  Pulse 70 SpO2 98%   PHYSICAL EXAM General: NAD HEENT: Normal. Neck: No JVD, no thyromegaly. Lungs: Clear to auscultation bilaterally with normal respiratory effort. CV: Nondisplaced PMI.  Regular rate and rhythm, normal S1/S2, no S3/S4, no murmur. No pretibial or periankle edema.  No carotid bruit.  Normal pedal pulses.  Abdomen: Soft, nontender, no hepatosplenomegaly, no distention.  Neurologic: Alert and oriented x 3.  Psych: Normal affect. Skin: Normal. Musculoskeletal: Normal range of motion, no gross deformities. Extremities: No clubbing or cyanosis.   ECG: Most recent ECG reviewed.      ASSESSMENT AND PLAN: 1. CAD s/p 1-v CABG s/p PCI of first OM and mid LCx: Symptomatically stable. Continue ASA, Toprol-XL, Plavix (multiple stents and tolerating this medication) and Crestor.  He will require dual antiplatelet therapy for a minimum of 1 year.  2. Essential HTN: Low normal today on current therapy and asymptomatic. No changes.  3. Hyperlipidemia: Well controlled. Continue Crestor 20 mg daily.   Dispo: f/u 6 months.   Kate Sable, M.D., F.A.C.C.

## 2014-04-06 ENCOUNTER — Encounter (HOSPITAL_COMMUNITY)
Admission: RE | Admit: 2014-04-06 | Discharge: 2014-04-06 | Disposition: A | Discharge status: Home / Self Care | Payer: BC Managed Care – PPO | Source: Ambulatory Visit | Attending: Cardiovascular Disease | Admitting: Cardiovascular Disease

## 2014-04-06 DIAGNOSIS — I2510 Atherosclerotic heart disease of native coronary artery without angina pectoris: Secondary | ICD-10-CM | POA: Diagnosis not present

## 2014-04-08 ENCOUNTER — Encounter (HOSPITAL_COMMUNITY): Payer: BC Managed Care – PPO

## 2014-04-10 ENCOUNTER — Encounter (HOSPITAL_COMMUNITY): Payer: BC Managed Care – PPO

## 2014-04-13 ENCOUNTER — Encounter (HOSPITAL_COMMUNITY)
Admission: RE | Admit: 2014-04-13 | Discharge: 2014-04-13 | Disposition: A | Discharge status: Home / Self Care | Payer: BC Managed Care – PPO | Source: Ambulatory Visit | Attending: Cardiovascular Disease | Admitting: Cardiovascular Disease

## 2014-04-13 DIAGNOSIS — I2510 Atherosclerotic heart disease of native coronary artery without angina pectoris: Secondary | ICD-10-CM | POA: Diagnosis not present

## 2014-04-15 ENCOUNTER — Encounter (HOSPITAL_COMMUNITY)
Admission: RE | Admit: 2014-04-15 | Discharge: 2014-04-15 | Disposition: A | Discharge status: Home / Self Care | Payer: BC Managed Care – PPO | Source: Ambulatory Visit | Attending: Cardiovascular Disease | Admitting: Cardiovascular Disease

## 2014-04-15 DIAGNOSIS — I2510 Atherosclerotic heart disease of native coronary artery without angina pectoris: Secondary | ICD-10-CM | POA: Diagnosis not present

## 2014-04-17 ENCOUNTER — Encounter (HOSPITAL_COMMUNITY): Payer: BC Managed Care – PPO

## 2014-04-20 ENCOUNTER — Encounter (HOSPITAL_COMMUNITY)
Admission: RE | Admit: 2014-04-20 | Discharge: 2014-04-20 | Disposition: A | Discharge status: Home / Self Care | Payer: BC Managed Care – PPO | Source: Ambulatory Visit | Attending: Cardiovascular Disease | Admitting: Cardiovascular Disease

## 2014-04-20 DIAGNOSIS — I2510 Atherosclerotic heart disease of native coronary artery without angina pectoris: Secondary | ICD-10-CM | POA: Diagnosis not present

## 2014-04-20 NOTE — Progress Notes (Signed)
Cardiac Rehabilitation Program Outcomes Report   Orientation:  01/20/14 Graduate Date:  tbd Discharge Date:  tbd # of sessions completed: 29  Cardiologist: Gaynelle Cage MD:  Burdine Class Time:  0815  A.  Exercise Program:  Tolerates exercise @ 3.70 METS for 15 minutes and Walk Test Results:  Post: 3.47 mets  B.  Mental Health:  Good mental attitude  C.  Education/Instruction/Skills  Accurately checks own pulse.  Rest:  78  Exercise:  99, Knows THR for exercise and Attended 9 education classes  Home exercise given: 04/20/14, exercise prescription plan given by EP.   D.  Nutrition/Weight Control/Body Composition:  Adherence to prescribed nutrition program: good  and % Body Fat  -2.7   E.  Blood Lipids   No results found for: CHOL, HDL, LDLCALC, LDLDIRECT, TRIG, CHOLHDL  F.  Lifestyle Changes:  Making positive lifestyle changes  G.  Symptoms noted with exercise:  Asymptomatic  Report Completed By:  Stevphen Rochester RN    Comments:  Patient has graduated cardiac rehab with 29 sessions.  Patient initially planned to do 36 sessions but patient had to return to work.  Patient progressed well and is happy with results. Patient also stated he is going to retire in June and may start the maintenance program then.

## 2014-04-22 ENCOUNTER — Encounter (HOSPITAL_COMMUNITY): Payer: BC Managed Care – PPO

## 2014-04-24 ENCOUNTER — Encounter (HOSPITAL_COMMUNITY): Payer: BC Managed Care – PPO

## 2014-04-28 NOTE — Progress Notes (Signed)
Patient is discharged from [place] [facility] program today, with 29 sessions.  He achieved LTG of 30 minutes of aerobic exercise at max met level of 3.70.  All patient vitals are WNL.  Patient has met with dietician.  Discharge instructions have been reviewed in detail and patient expressed an understanding of material given.  Patient plans to exercise at home and possibly join the maintenance program after June 2016 when he retires.  Cardiac Rehab will make 1 month, 6 month and 1 year call backs.  Patient had no complaints of any abnormal S/S or pain on their exit visit.  Patient discharged himself due to having to go back to work.

## 2014-04-28 NOTE — Addendum Note (Signed)
Encounter addended by: Cathie Olden, RN on: 04/28/2014  2:22 PM<BR>     Documentation filed: Notes Section

## 2014-04-30 ENCOUNTER — Encounter (HOSPITAL_COMMUNITY): Payer: Self-pay | Admitting: Cardiovascular Disease

## 2014-09-02 ENCOUNTER — Emergency Department (HOSPITAL_COMMUNITY): Payer: BLUE CROSS/BLUE SHIELD

## 2014-09-02 ENCOUNTER — Emergency Department (HOSPITAL_COMMUNITY)
Admission: EM | Admit: 2014-09-02 | Discharge: 2014-09-03 | Disposition: A | Discharge status: Home / Self Care | Payer: BLUE CROSS/BLUE SHIELD | Attending: Emergency Medicine | Admitting: Emergency Medicine

## 2014-09-02 ENCOUNTER — Encounter (HOSPITAL_COMMUNITY): Payer: Self-pay | Admitting: *Deleted

## 2014-09-02 DIAGNOSIS — Y929 Unspecified place or not applicable: Secondary | ICD-10-CM | POA: Insufficient documentation

## 2014-09-02 DIAGNOSIS — Y9389 Activity, other specified: Secondary | ICD-10-CM | POA: Insufficient documentation

## 2014-09-02 DIAGNOSIS — E782 Mixed hyperlipidemia: Secondary | ICD-10-CM | POA: Insufficient documentation

## 2014-09-02 DIAGNOSIS — Z7902 Long term (current) use of antithrombotics/antiplatelets: Secondary | ICD-10-CM | POA: Insufficient documentation

## 2014-09-02 DIAGNOSIS — I2510 Atherosclerotic heart disease of native coronary artery without angina pectoris: Secondary | ICD-10-CM | POA: Insufficient documentation

## 2014-09-02 DIAGNOSIS — S59911A Unspecified injury of right forearm, initial encounter: Secondary | ICD-10-CM

## 2014-09-02 DIAGNOSIS — Z79899 Other long term (current) drug therapy: Secondary | ICD-10-CM | POA: Insufficient documentation

## 2014-09-02 DIAGNOSIS — M1990 Unspecified osteoarthritis, unspecified site: Secondary | ICD-10-CM | POA: Insufficient documentation

## 2014-09-02 DIAGNOSIS — Y998 Other external cause status: Secondary | ICD-10-CM | POA: Insufficient documentation

## 2014-09-02 DIAGNOSIS — S5011XA Contusion of right forearm, initial encounter: Secondary | ICD-10-CM | POA: Diagnosis not present

## 2014-09-02 DIAGNOSIS — Z87442 Personal history of urinary calculi: Secondary | ICD-10-CM | POA: Insufficient documentation

## 2014-09-02 DIAGNOSIS — Z951 Presence of aortocoronary bypass graft: Secondary | ICD-10-CM | POA: Diagnosis not present

## 2014-09-02 DIAGNOSIS — Z8572 Personal history of non-Hodgkin lymphomas: Secondary | ICD-10-CM | POA: Insufficient documentation

## 2014-09-02 DIAGNOSIS — X58XXXA Exposure to other specified factors, initial encounter: Secondary | ICD-10-CM | POA: Insufficient documentation

## 2014-09-02 DIAGNOSIS — Z7982 Long term (current) use of aspirin: Secondary | ICD-10-CM | POA: Insufficient documentation

## 2014-09-02 DIAGNOSIS — Z86010 Personal history of colonic polyps: Secondary | ICD-10-CM | POA: Diagnosis not present

## 2014-09-02 DIAGNOSIS — I10 Essential (primary) hypertension: Secondary | ICD-10-CM | POA: Insufficient documentation

## 2014-09-02 NOTE — ED Notes (Signed)
The pt was hammering yesterday. Huis rt elbow started stinging  And swelling.  No the rt upper forearm has a large bruise just below the rt elbow.    No movement reduction color of the rest of the arm good.  Swelling to the rest of his fiorearm

## 2014-09-02 NOTE — ED Notes (Signed)
Patient taken to Xray and will be taken to E39 when finished.

## 2014-09-03 NOTE — ED Provider Notes (Signed)
CSN: [id number]     Arrival date & time 09/02/14  2155 History   First MD Initiated Contact with Patient 09/02/14 2328     Chief Complaint  Patient presents with  . Arm Pain     (Consider location/radiation/quality/duration/timing/severity/associated sxs/prior Treatment) Patient is a 64 y.o. male presenting with arm pain. The history is provided by the patient.  Arm Pain This is a new problem. Pertinent negatives include no headaches and no shortness of breath.   2 days ago patient was hammering and felt a sharp pain in his right forearm. States he then developed some bruising. Mild pain at the site. No other bleeding. No numbness weakness. He has not had other sites of bleeding.  Past Medical History  Diagnosis Date  . Essential hypertension, benign   . Mixed hyperlipidemia   . Personal history of colonic polyps   . Lumbago   . Kidney stones   . Arthritis   . Non Hodgkin's lymphoma 2002    a. stage IV  . CAD (coronary artery disease)     a. DES to OM 1 and DES mLCX (12/05/13) b. CABG x1  ('92) c. 6 stents placed in 2009? (pt hx)    Past Surgical History  Procedure Laterality Date  . Coronary artery bypass graft  1992    "CABG X1"  . Coronary angioplasty with stent placement  ~ 1999; ~ 2009 X 2; 12/05/2013    "1 +3 + 2 + 2"  . Tonsillectomy    . Inguinal hernia repair    . Exploratory laparotomy with abdominal mass excision  08/2000    "looking for cancer"  . Cholecystectomy  08/2000  . Cystostomy w/ stent insertion    . Cystoscopy w/ ureteroscopy w/ lithotripsy    . Left heart catheterization with coronary angiogram N/A 12/05/2013    Procedure: LEFT HEART CATHETERIZATION WITH CORONARY ANGIOGRAM;  Surgeon: Blane Ohara, MD;  Location: [facility] CATH LAB;  Service: Cardiovascular;  Laterality: N/A;  . Percutaneous coronary stent intervention (pci-s)  12/05/2013    Procedure: PERCUTANEOUS CORONARY STENT INTERVENTION (PCI-S);  Surgeon: Blane Ohara, MD;  Location: [facility] CATH LAB;   Service: Cardiovascular;;  mid circumflex and prox OM2   No family history on file. History  Substance Use Topics  . Smoking status: Never Smoker   . Smokeless tobacco: Current User    Types: Snuff     Comment: 12/05/2013 "use snuff q now and again"  . Alcohol Use: 0.0 oz/week    0 Standard drinks or equivalent per week     Comment: 12/05/2013 "maybe 2 cans beer/yr"    Review of Systems  Eyes: Negative for pain.  Respiratory: Negative for shortness of breath.   Cardiovascular: Negative for leg swelling.  Musculoskeletal: Negative for back pain and neck stiffness.  Skin: Negative for rash.  Neurological: Negative for weakness, numbness and headaches.  Hematological: Does not bruise/bleed easily.  Psychiatric/Behavioral: Negative for behavioral problems.      Allergies  Ivp dye  Home Medications   Prior to Admission medications   Medication Sig Start Date End Date Taking? Authorizing Provider  aspirin 81 MG tablet Take 81 mg by mouth daily.    Historical Provider, MD  clopidogrel (PLAVIX) 75 MG tablet Take 75 mg by mouth daily.    Historical Provider, MD  cyclobenzaprine (FLEXERIL) 10 MG tablet Take 10 mg by mouth 3 (three) times daily as needed for muscle spasms.    Historical Provider, MD  metoprolol succinate (TOPROL-XL)  25 MG 24 hr tablet Take 1 tablet (25 mg total) by mouth 2 (two) times daily. 12/16/13   Herminio Commons, MD  Multiple Vitamin (MULTIVITAMIN) tablet Take 1 tablet by mouth daily.    Historical Provider, MD  rosuvastatin (CRESTOR) 20 MG tablet Take 20 mg by mouth daily.    Historical Provider, MD  vitamin B-12 (CYANOCOBALAMIN) 500 MCG tablet Take 500 mcg by mouth daily.    Historical Provider, MD   BP 121/85 mmHg  Pulse 64  Temp(Src) 98.6 F (37 C) (Oral)  Resp 16  Ht 5\' 11"  (1.803 m)  Wt 232 lb (105.235 kg)  BMI 32.37 kg/m2  SpO2 97% Physical Exam  Constitutional: He appears well-developed and well-nourished.  Pulmonary/Chest: Effort normal.   Musculoskeletal: He exhibits tenderness.   Ecchymosis along dorsal and somewhat medial aspect of right forearm. Mild tenderness. No fluctuance. No induration. Strong pulse and wrist. Sensation intact grossly over him.  Neurological: He is alert.  Skin: Skin is warm.    ED Course  Procedures (including critical care time) Labs Review Labs Reviewed - No data to display  Imaging Review Dg Elbow Complete Right  09/03/2014   CLINICAL DATA:  Elbow pain after hammering  EXAM: RIGHT ELBOW - COMPLETE 3+ VIEW  COMPARISON:  None.  FINDINGS: There is no evidence of fracture, dislocation, or joint effusion.  Nonspecific medial and posterior forearm soft tissue swelling.  Enthesophytes to the bilateral supracondylar humerus.  IMPRESSION: Soft tissue swelling without acute osseous abnormality or joint effusion.   Electronically Signed   By: Monte Fantasia M.D.   On: 09/03/2014 00:22     EKG Interpretation None      MDM   Final diagnoses:  Right forearm injury, initial encounter     patient with likely muscle tear due to hammering. X-ray does not show any bony abnormality. Will discharge home. No other signs of bleeding.    Davonna Belling, MD 09/03/14 [phone]

## 2014-09-03 NOTE — Discharge Instructions (Signed)
You likely have bruising in her forearm from a small tear. That should resolve on its own. Follow up with her primary care doctor if it does not improve.

## 2014-09-11 ENCOUNTER — Other Ambulatory Visit: Payer: Self-pay | Admitting: *Deleted

## 2014-09-11 MED ORDER — METOPROLOL SUCCINATE ER 25 MG PO TB24: 25.0000 mg | ORAL_TABLET | Freq: Two times a day (BID) | ORAL | Status: DC

## 2014-10-01 ENCOUNTER — Encounter: Payer: Self-pay | Admitting: *Deleted

## 2014-10-05 ENCOUNTER — Encounter: Payer: Self-pay | Admitting: Cardiovascular Disease

## 2014-10-05 ENCOUNTER — Ambulatory Visit (INDEPENDENT_AMBULATORY_CARE_PROVIDER_SITE_OTHER): Payer: BLUE CROSS/BLUE SHIELD | Admitting: Cardiovascular Disease

## 2014-10-05 VITALS — BP 110/64 | HR 71 | Ht 71.0 in | Wt 239.0 lb

## 2014-10-05 DIAGNOSIS — I10 Essential (primary) hypertension: Secondary | ICD-10-CM

## 2014-10-05 DIAGNOSIS — I25810 Atherosclerosis of coronary artery bypass graft(s) without angina pectoris: Secondary | ICD-10-CM | POA: Diagnosis not present

## 2014-10-05 DIAGNOSIS — E785 Hyperlipidemia, unspecified: Secondary | ICD-10-CM | POA: Diagnosis not present

## 2014-10-05 NOTE — Patient Instructions (Signed)
Your physician recommends that you continue on your current medications as directed. Please refer to the Current Medication list given to you today. Your physician recommends that you schedule a follow-up appointment in: 6 months. You will receive a reminder letter in the mail in about 4 months reminding you to call and schedule your appointment. If you don't receive this letter, please contact our office. 

## 2014-10-05 NOTE — Progress Notes (Signed)
Patient ID: Ryan Vaughn, male   DOB: Oct 29, 1950, 64 y.o.   MRN: [id number]      SUBJECTIVE: The patient is here to follow up for CAD. He successfully underwent PCI of the first OM and mid circumflex using drug-eluting stents on 12/05/13.  His daughter, [name], is an Therapist, sports and works at [employer]. He has another daughter who is a respiratory therapist at [employer].  He completed cardiac rehabilitation. The patient denies any symptoms of chest pain, palpitations, shortness of breath, lightheadedness, dizziness, leg swelling, orthopnea, PND, and syncope.  He enjoys fishing.  He is planning on retiring this year.   Review of Systems: As per "subjective", otherwise negative.  Allergies  Allergen Reactions  . Ivp Dye [Iodinated Diagnostic Agents] Other (See Comments)    headache    Current Outpatient Prescriptions  Medication Sig Dispense Refill  . aspirin 81 MG tablet Take 81 mg by mouth daily.    . clopidogrel (PLAVIX) 75 MG tablet Take 75 mg by mouth daily.    . cyclobenzaprine (FLEXERIL) 10 MG tablet Take 10 mg by mouth 3 (three) times daily as needed for muscle spasms.    . metoprolol succinate (TOPROL-XL) 25 MG 24 hr tablet Take 1 tablet (25 mg total) by mouth 2 (two) times daily. 180 tablet 3  . Multiple Vitamin (MULTIVITAMIN) tablet Take 1 tablet by mouth daily.    . rosuvastatin (CRESTOR) 20 MG tablet Take 20 mg by mouth daily.    . vitamin B-12 (CYANOCOBALAMIN) 500 MCG tablet Take 500 mcg by mouth daily.     No current facility-administered medications for this visit.    Past Medical History  Diagnosis Date  . Essential hypertension, benign   . Mixed hyperlipidemia   . Personal history of colonic polyps   . Lumbago   . Kidney stones   . Arthritis   . Non Hodgkin's lymphoma 2002    a. stage IV  . CAD (coronary artery disease)     a. DES to OM 1 and DES mLCX (12/05/13) b. CABG x1  ('92) c. 6 stents placed in 2009? (pt hx)     Past Surgical History  Procedure  Laterality Date  . Coronary artery bypass graft  1992    "CABG X1"  . Coronary angioplasty with stent placement  ~ 1999; ~ 2009 X 2; 12/05/2013    "1 +3 + 2 + 2"  . Tonsillectomy    . Inguinal hernia repair    . Exploratory laparotomy with abdominal mass excision  08/2000    "looking for cancer"  . Cholecystectomy  08/2000  . Cystostomy w/ stent insertion    . Cystoscopy w/ ureteroscopy w/ lithotripsy    . Left heart catheterization with coronary angiogram N/A 12/05/2013    Procedure: LEFT HEART CATHETERIZATION WITH CORONARY ANGIOGRAM;  Surgeon: Blane Ohara, MD;  Location: [facility] CATH LAB;  Service: Cardiovascular;  Laterality: N/A;  . Percutaneous coronary stent intervention (pci-s)  12/05/2013    Procedure: PERCUTANEOUS CORONARY STENT INTERVENTION (PCI-S);  Surgeon: Blane Ohara, MD;  Location: [facility] CATH LAB;  Service: Cardiovascular;;  mid circumflex and prox OM2    History   Social History  . Marital Status: Single    Spouse Name: N/A  . Number of Children: N/A  . Years of Education: N/A   Occupational History  . Not on file.   Social History Main Topics  . Smoking status: Never Smoker   . Smokeless tobacco: Current User    Types:  Snuff     Comment: 12/05/2013 "use snuff q now and again"  . Alcohol Use: 0.0 oz/week    0 Standard drinks or equivalent per week     Comment: 12/05/2013 "maybe 2 cans beer/yr"  . Drug Use: No  . Sexual Activity: Yes   Other Topics Concern  . Not on file   Social History Narrative     Filed Vitals:   10/05/14 1534  BP: 110/64  Pulse: 71  Height: 5\' 11"  (1.803 m)  Weight: 239 lb (108.41 kg)  SpO2: 95%    PHYSICAL EXAM General: NAD HEENT: Normal. Neck: No JVD, no thyromegaly. Lungs: Clear to auscultation bilaterally with normal respiratory effort. CV: Nondisplaced PMI.  Regular rate and rhythm, normal S1/S2, no S3/S4, no murmur. No pretibial or periankle edema.  No carotid bruit.  Normal pedal pulses.  Abdomen: Soft, nontender, no  hepatosplenomegaly, no distention.  Neurologic: Alert and oriented x 3.  Psych: Normal affect. Skin: Normal. Musculoskeletal: Normal range of motion, no gross deformities. Extremities: No clubbing or cyanosis.   ECG: Most recent ECG reviewed.      ASSESSMENT AND PLAN: 1. CAD s/p 1-v CABG s/p PCI of first OM and mid LCx: Symptomatically stable. Continue ASA, Toprol-XL, Plavix (multiple stents and tolerating this medication) and Crestor. He will require dual antiplatelet therapy for a minimum of 1 year, perhaps indefinitely.  2. Essential HTN: Controlled. No changes.  3. Hyperlipidemia: Soon to have lipids checked by PCP. Will obtain a copy. Continue Crestor 20 mg daily.   Dispo: f/u 6 months.   Kate Sable, M.D., F.A.C.C.

## 2014-10-31 ENCOUNTER — Emergency Department (HOSPITAL_COMMUNITY)
Admission: EM | Admit: 2014-10-31 | Discharge: 2014-10-31 | Disposition: A | Discharge status: Home / Self Care | Payer: BLUE CROSS/BLUE SHIELD | Source: Home / Self Care | Attending: Family Medicine | Admitting: Family Medicine

## 2014-10-31 ENCOUNTER — Encounter (HOSPITAL_COMMUNITY): Payer: Self-pay

## 2014-10-31 DIAGNOSIS — J019 Acute sinusitis, unspecified: Secondary | ICD-10-CM | POA: Diagnosis not present

## 2014-10-31 DIAGNOSIS — J9801 Acute bronchospasm: Secondary | ICD-10-CM | POA: Diagnosis not present

## 2014-10-31 DIAGNOSIS — R05 Cough: Secondary | ICD-10-CM | POA: Diagnosis not present

## 2014-10-31 DIAGNOSIS — R059 Cough, unspecified: Secondary | ICD-10-CM

## 2014-10-31 DIAGNOSIS — R0982 Postnasal drip: Secondary | ICD-10-CM

## 2014-10-31 MED ORDER — IPRATROPIUM-ALBUTEROL 0.5-2.5 (3) MG/3ML IN SOLN
3.0000 mL | Freq: Once | RESPIRATORY_TRACT | Status: AC
  Administered 2014-10-31: 3 mL via RESPIRATORY_TRACT

## 2014-10-31 MED ORDER — IPRATROPIUM-ALBUTEROL 0.5-2.5 (3) MG/3ML IN SOLN
RESPIRATORY_TRACT | Status: AC
  Filled 2014-10-31: qty 3

## 2014-10-31 MED ORDER — IPRATROPIUM BROMIDE 0.06 % NA SOLN: 2.0000 | Freq: Four times a day (QID) | NASAL | Status: DC

## 2014-10-31 MED ORDER — ALBUTEROL SULFATE (2.5 MG/3ML) 0.083% IN NEBU
2.5000 mg | INHALATION_SOLUTION | Freq: Once | RESPIRATORY_TRACT | Status: AC
  Administered 2014-10-31: 2.5 mg via RESPIRATORY_TRACT

## 2014-10-31 MED ORDER — TRIAMCINOLONE ACETONIDE 40 MG/ML IJ SUSP
INTRAMUSCULAR | Status: AC
  Filled 2014-10-31: qty 1

## 2014-10-31 MED ORDER — ALBUTEROL SULFATE HFA 108 (90 BASE) MCG/ACT IN AERS: 2.0000 | INHALATION_SPRAY | RESPIRATORY_TRACT | Status: DC | PRN

## 2014-10-31 MED ORDER — TRIAMCINOLONE ACETONIDE 40 MG/ML IJ SUSP
40.0000 mg | Freq: Once | INTRAMUSCULAR | Status: AC
  Administered 2014-10-31: 40 mg via INTRAMUSCULAR

## 2014-10-31 MED ORDER — ALBUTEROL SULFATE (2.5 MG/3ML) 0.083% IN NEBU
INHALATION_SOLUTION | RESPIRATORY_TRACT | Status: AC
  Filled 2014-10-31: qty 3

## 2014-10-31 MED ORDER — PREDNISONE 20 MG PO TABS: ORAL_TABLET | ORAL | Status: DC

## 2014-10-31 MED ORDER — CEFDINIR 300 MG PO CAPS: 300.0000 mg | ORAL_CAPSULE | Freq: Two times a day (BID) | ORAL | Status: DC

## 2014-10-31 NOTE — Discharge Instructions (Signed)
Sinusitis atrovent nasal spray Allegra or zyrtec daily for drainage Sinusitis is redness, soreness, and puffiness (inflammation) of the air pockets in the bones of your face (sinuses). The redness, soreness, and puffiness can cause air and mucus to get trapped in your sinuses. This can allow germs to grow and cause an infection.  HOME CARE  1. Drink enough fluids to keep your pee (urine) clear or pale yellow. 2. Use a humidifier in your home. 3. Run a hot shower to create steam in the bathroom. Sit in the bathroom with the door closed. Breathe in the steam 3-4 times a day. 4. Put a warm, moist washcloth on your face 3-4 times a day, or as told by your doctor. 5. Use salt water sprays (saline sprays) to wet the thick fluid in your nose. This can help the sinuses drain. 6. Only take medicine as told by your doctor. GET HELP RIGHT AWAY IF:   Your pain gets worse.  You have very bad headaches.  You are sick to your stomach (nauseous).  You throw up (vomit).  You are very sleepy (drowsy) all the time.  Your face is puffy (swollen).  Your vision changes.  You have a stiff neck.  You have trouble breathing. MAKE SURE YOU:   Understand these instructions.  Will watch your condition.  Will get help right away if you are not doing well or get worse. Document Released: 10/25/2007 Document Revised: 01/31/2012 Document Reviewed: 12/12/2011 [facility] Patient Information 2015 [place], [place]. This information is not intended to replace advice given to you by your health care provider. Make sure you discuss any questions you have with your health care provider.  Bronchospasm A bronchospasm is a spasm or tightening of the airways going into the lungs. During a bronchospasm breathing becomes more difficult because the airways get smaller. When this happens there can be coughing, a whistling sound when breathing (wheezing), and difficulty breathing. Bronchospasm is often associated with asthma,  but not all patients who experience a bronchospasm have asthma. CAUSES  A bronchospasm is caused by inflammation or irritation of the airways. The inflammation or irritation may be triggered by:  7. Allergies (such as to animals, pollen, food, or mold). Allergens that cause bronchospasm may cause wheezing immediately after exposure or many hours later.  8. Infection. Viral infections are believed to be the most common cause of bronchospasm.  9. Exercise.  10. Irritants (such as pollution, cigarette smoke, strong odors, aerosol sprays, and paint fumes).  11. Weather changes. Winds increase molds and pollens in the air. Rain refreshes the air by washing irritants out. Cold air may cause inflammation.  12. Stress and emotional upset.  SIGNS AND SYMPTOMS   Wheezing.   Excessive nighttime coughing.   Frequent or severe coughing with a simple cold.   Chest tightness.   Shortness of breath.  DIAGNOSIS  Bronchospasm is usually diagnosed through a history and physical exam. Tests, such as chest X-rays, are sometimes done to look for other conditions. TREATMENT   Inhaled medicines can be given to open up your airways and help you breathe. The medicines can be given using either an inhaler or a nebulizer machine.  Corticosteroid medicines may be given for severe bronchospasm, usually when it is associated with asthma. HOME CARE INSTRUCTIONS   Always have a plan prepared for seeking medical care. Know when to call your health care provider and local emergency services (911 in the [place] Know where you can access local emergency care.  Only  take medicines as directed by your health care provider.  If you were prescribed an inhaler or nebulizer machine, ask your health care provider to explain how to use it correctly. Always use a spacer with your inhaler if you were given one.  It is necessary to remain calm during an attack. Try to relax and breathe more slowly.  Control your  home environment in the following ways:   Change your heating and air conditioning filter at least once a month.   Limit your use of fireplaces and wood stoves.  Do not smoke and do not allow smoking in your home.   Avoid exposure to perfumes and fragrances.   Get rid of pests (such as roaches and mice) and their droppings.   Throw away plants if you see mold on them.   Keep your house clean and dust free.   Replace carpet with wood, tile, or vinyl flooring. Carpet can trap dander and dust.   Use allergy-proof pillows, mattress covers, and box spring covers.   Wash bed sheets and blankets every week in hot water and dry them in a dryer.   Use blankets that are made of polyester or cotton.   Wash hands frequently. SEEK MEDICAL CARE IF:   You have muscle aches.   You have chest pain.   The sputum changes from clear or white to yellow, green, gray, or bloody.   The sputum you cough up gets thicker.   There are problems that may be related to the medicine you are given, such as a rash, itching, swelling, or trouble breathing.  SEEK IMMEDIATE MEDICAL CARE IF:   You have worsening wheezing and coughing even after taking your prescribed medicines.   You have increased difficulty breathing.   You develop severe chest pain. MAKE SURE YOU:   Understand these instructions.  Will watch your condition.  Will get help right away if you are not doing well or get worse. Document Released: 05/11/2003 Document Revised: 05/13/2013 Document Reviewed: 10/28/2012 [facility] Patient Information 2015 [place], [place]. This information is not intended to replace advice given to you by your health care provider. Make sure you discuss any questions you have with your health care provider.  How to Use an Inhaler Using your inhaler correctly is very important. Good technique will make sure that the medicine reaches your lungs.  HOW TO USE AN INHALER: 13. Take the cap off the  inhaler. 14. If this is the first time using your inhaler, you need to prime it. Shake the inhaler for 5 seconds. Release four puffs into the air, away from your face. Ask your doctor for help if you have questions. 15. Shake the inhaler for 5 seconds. 16. Turn the inhaler so the bottle is above the mouthpiece. 17. Put your pointer finger on top of the bottle. Your thumb holds the bottom of the inhaler. 18. Open your mouth. 19. Either hold the inhaler away from your mouth (the width of 2 fingers) or place your lips tightly around the mouthpiece. Ask your doctor which way to use your inhaler. 20. Breathe out as much air as possible. 21. Breathe in and push down on the bottle 1 time to release the medicine. You will feel the medicine go in your mouth and throat. 22. Continue to take a deep breath in very slowly. Try to fill your lungs. 23. After you have breathed in completely, hold your breath for 10 seconds. This will help the medicine to settle in your lungs.  If you cannot hold your breath for 10 seconds, hold it for as long as you can before you breathe out. 24. Breathe out slowly, through pursed lips. Whistling is an example of pursed lips. 25. If your doctor has told you to take more than 1 puff, wait at least 15-30 seconds between puffs. This will help you get the best results from your medicine. Do not use the inhaler more than your doctor tells you to. 26. Put the cap back on the inhaler. 27. Follow the directions from your doctor or from the inhaler package about cleaning the inhaler. If you use more than one inhaler, ask your doctor which inhalers to use and what order to use them in. Ask your doctor to help you figure out when you will need to refill your inhaler.  If you use a steroid inhaler, always rinse your mouth with water after your last puff, gargle and spit out the water. Do not swallow the water. GET HELP IF:  The inhaler medicine only partially helps to stop wheezing or  shortness of breath.  You are having trouble using your inhaler.  You have some increase in thick spit (phlegm). GET HELP RIGHT AWAY IF:  The inhaler medicine does not help your wheezing or shortness of breath or you have tightness in your chest.  You have dizziness, headaches, or fast heart rate.  You have chills, fever, or night sweats.  You have a large increase of thick spit, or your thick spit is bloody. MAKE SURE YOU:   Understand these instructions.  Will watch your condition.  Will get help right away if you are not doing well or get worse. Document Released: 02/15/2008 Document Revised: 02/26/2013 Document Reviewed: 12/05/2012 [facility] Patient Information 2015 [place], [place]. This information is not intended to replace advice given to you by your health care provider. Make sure you discuss any questions you have with your health care provider.

## 2014-10-31 NOTE — ED Notes (Signed)
Cough ( productive), head congestion, chills, sinus pain/pressure x 2 days

## 2014-10-31 NOTE — ED Provider Notes (Signed)
CSN: [id number]     Arrival date & time 10/31/14  1322 History   First MD Initiated Contact with Patient 10/31/14 1457     Chief Complaint  Patient presents with  . Cough   (Consider location/radiation/quality/duration/timing/severity/associated sxs/prior Treatment) HPI Comments: 64 year old male states that he developed onset of respiratory symptoms yesterday. He is complaining of a headache, cough, runny nose, PND, chills and shortness of breath associated with low-grade fever yesterday. He states his temperature was 100 to 101.   Past Medical History  Diagnosis Date  . Essential hypertension, benign   . Mixed hyperlipidemia   . Personal history of colonic polyps   . Lumbago   . Kidney stones   . Arthritis   . Non Hodgkin's lymphoma 2002    a. stage IV  . CAD (coronary artery disease)     a. DES to OM 1 and DES mLCX (12/05/13) b. CABG x1  ('92) c. 6 stents placed in 2009? (pt hx)    Past Surgical History  Procedure Laterality Date  . Coronary artery bypass graft  1992    "CABG X1"  . Coronary angioplasty with stent placement  ~ 1999; ~ 2009 X 2; 12/05/2013    "1 +3 + 2 + 2"  . Tonsillectomy    . Inguinal hernia repair    . Exploratory laparotomy with abdominal mass excision  08/2000    "looking for cancer"  . Cholecystectomy  08/2000  . Cystostomy w/ stent insertion    . Cystoscopy w/ ureteroscopy w/ lithotripsy    . Left heart catheterization with coronary angiogram N/A 12/05/2013    Procedure: LEFT HEART CATHETERIZATION WITH CORONARY ANGIOGRAM;  Surgeon: Blane Ohara, MD;  Location: [facility] CATH LAB;  Service: Cardiovascular;  Laterality: N/A;  . Percutaneous coronary stent intervention (pci-s)  12/05/2013    Procedure: PERCUTANEOUS CORONARY STENT INTERVENTION (PCI-S);  Surgeon: Blane Ohara, MD;  Location: [facility] CATH LAB;  Service: Cardiovascular;;  mid circumflex and prox OM2   Family History  Problem Relation Age of Onset  . Breast cancer Mother   . Colon cancer Father     History  Substance Use Topics  . Smoking status: Never Smoker   . Smokeless tobacco: Current User    Types: Snuff     Comment: 12/05/2013 "use snuff q now and again"  . Alcohol Use: 0.0 oz/week    0 Standard drinks or equivalent per week     Comment: 12/05/2013 "maybe 2 cans beer/yr"    Review of Systems  Constitutional: Positive for fever and activity change. Negative for diaphoresis and fatigue.  HENT: Positive for congestion, postnasal drip, rhinorrhea, sinus pressure and sore throat. Negative for ear pain, facial swelling and trouble swallowing.   Eyes: Negative for pain, discharge and redness.  Respiratory: Positive for cough. Negative for chest tightness and shortness of breath.   Cardiovascular: Negative.   Gastrointestinal: Negative.   Musculoskeletal: Negative.  Negative for neck pain and neck stiffness.  Skin: Negative for rash.  Neurological: Negative.  Negative for tremors and speech difficulty.  Psychiatric/Behavioral: Negative.     Allergies  Ivp dye  Home Medications   Prior to Admission medications   Medication Sig Start Date End Date Taking? Authorizing Provider  albuterol (PROVENTIL HFA;VENTOLIN HFA) 108 (90 BASE) MCG/ACT inhaler Inhale 2 puffs into the lungs every 4 (four) hours as needed for wheezing or shortness of breath. 10/31/14   Janne Napoleon, NP  aspirin 81 MG tablet Take 81 mg by mouth daily.  Historical Provider, MD  cefdinir (OMNICEF) 300 MG capsule Take 1 capsule (300 mg total) by mouth 2 (two) times daily. 10/31/14   Janne Napoleon, NP  clopidogrel (PLAVIX) 75 MG tablet Take 75 mg by mouth daily.    Historical Provider, MD  cyclobenzaprine (FLEXERIL) 10 MG tablet Take 10 mg by mouth 3 (three) times daily as needed for muscle spasms.    Historical Provider, MD  ipratropium (ATROVENT) 0.06 % nasal spray Place 2 sprays into both nostrils 4 (four) times daily. 10/31/14   Janne Napoleon, NP  metoprolol succinate (TOPROL-XL) 25 MG 24 hr tablet Take 1 tablet (25  mg total) by mouth 2 (two) times daily. 09/11/14   Herminio Commons, MD  Multiple Vitamin (MULTIVITAMIN) tablet Take 1 tablet by mouth daily.    Historical Provider, MD  predniSONE (DELTASONE) 20 MG tablet Take 3 tabs po on first day, 2 tabs second day, 2 tabs third day, 1 tab fourth day, 1 tab 5th day. Take with food. Start 11-01-14. 10/31/14   Janne Napoleon, NP  rosuvastatin (CRESTOR) 20 MG tablet Take 20 mg by mouth daily.    Historical Provider, MD  vitamin B-12 (CYANOCOBALAMIN) 500 MCG tablet Take 500 mcg by mouth daily.    Historical Provider, MD   BP 138/81 mmHg  Pulse 95  Temp(Src) 99.6 F (37.6 C) (Oral)  Resp 16  SpO2 95% Physical Exam  Constitutional: He is oriented to person, place, and time. He appears well-developed and well-nourished. No distress.  HENT:  Mouth/Throat: No oropharyngeal exudate.  Bilateral TMs normal Oropharynx with minor erythema and moderate amount of clear PND. No exudates. Airway widely patent.  Eyes: Conjunctivae and EOM are normal.  Neck: Normal range of motion.  Cardiovascular: Normal rate, regular rhythm and normal heart sounds.   Pulmonary/Chest: Effort normal. He has wheezes. He has no rales.  Mildly prolonged expiratory phase. Diffuse coarseness and light wheezing.  Musculoskeletal: Normal range of motion. He exhibits no edema.  Lymphadenopathy:    He has no cervical adenopathy.  Neurological: He is alert and oriented to person, place, and time. He exhibits normal muscle tone.  Skin: Skin is warm and dry. No rash noted.  Nursing note and vitals reviewed.   ED Course  Procedures (including critical care time) Labs Review Labs Reviewed - No data to display  Imaging Review No results found.   MDM   1. Bronchospasm   2. Acute rhinosinusitis   3. Cough   4. PND (post-nasal drip)    Post DuoNeb 5 mg/2.5 mg there is moderate improvement in air movement. Decrease in wheezing. The patient states he is breathing a little better continues to  cough. Albuterol HFA as directed Kenalog 40 mg IM now, start prednisone taper tomorrow Omnicef 300 mg twice a day Atrovent nasal spray as directed Allegra or Zyrtec daily for drainage Tylenol for fever or discomfort Follow-up here PCP this coming week. If worse such as trouble breathing, increased fevers, worsening cough, vomiting or other symptoms sick medical attention promptly, go to the emergency department.    Janne Napoleon, NP 10/31/14 1624

## 2015-02-19 ENCOUNTER — Encounter (HOSPITAL_COMMUNITY): Payer: Self-pay

## 2015-02-19 ENCOUNTER — Emergency Department (HOSPITAL_COMMUNITY)
Admission: EM | Admit: 2015-02-19 | Discharge: 2015-02-19 | Disposition: A | Discharge status: Home / Self Care | Payer: BLUE CROSS/BLUE SHIELD | Attending: Emergency Medicine | Admitting: Emergency Medicine

## 2015-02-19 ENCOUNTER — Emergency Department (HOSPITAL_COMMUNITY): Payer: BLUE CROSS/BLUE SHIELD

## 2015-02-19 DIAGNOSIS — I2510 Atherosclerotic heart disease of native coronary artery without angina pectoris: Secondary | ICD-10-CM | POA: Diagnosis not present

## 2015-02-19 DIAGNOSIS — Z87442 Personal history of urinary calculi: Secondary | ICD-10-CM | POA: Diagnosis not present

## 2015-02-19 DIAGNOSIS — E782 Mixed hyperlipidemia: Secondary | ICD-10-CM | POA: Diagnosis not present

## 2015-02-19 DIAGNOSIS — R0981 Nasal congestion: Secondary | ICD-10-CM | POA: Diagnosis not present

## 2015-02-19 DIAGNOSIS — M1990 Unspecified osteoarthritis, unspecified site: Secondary | ICD-10-CM | POA: Diagnosis not present

## 2015-02-19 DIAGNOSIS — I10 Essential (primary) hypertension: Secondary | ICD-10-CM | POA: Diagnosis not present

## 2015-02-19 DIAGNOSIS — Z7982 Long term (current) use of aspirin: Secondary | ICD-10-CM | POA: Diagnosis not present

## 2015-02-19 DIAGNOSIS — R42 Dizziness and giddiness: Secondary | ICD-10-CM | POA: Insufficient documentation

## 2015-02-19 DIAGNOSIS — Z79899 Other long term (current) drug therapy: Secondary | ICD-10-CM | POA: Diagnosis not present

## 2015-02-19 LAB — I-STAT CHEM 8, ED
BUN: 19 mg/dL (ref 6–20)
CALCIUM ION: 1.12 mmol/L — AB (ref 1.13–1.30)
CREATININE: 1 mg/dL (ref 0.61–1.24)
Chloride: 100 mmol/L — ABNORMAL LOW (ref 101–111)
Glucose, Bld: 150 mg/dL — ABNORMAL HIGH (ref 65–99)
HCT: 46 % (ref 39.0–52.0)
Hemoglobin: 15.6 g/dL (ref 13.0–17.0)
Potassium: 4 mmol/L (ref 3.5–5.1)
Sodium: 139 mmol/L (ref 135–145)
TCO2: 25 mmol/L (ref 0–100)

## 2015-02-19 MED ORDER — AMOXICILLIN 500 MG PO CAPS: 500.0000 mg | ORAL_CAPSULE | Freq: Three times a day (TID) | ORAL | Status: DC

## 2015-02-19 MED ORDER — SODIUM CHLORIDE 0.9 % IV SOLN
Freq: Once | INTRAVENOUS | Status: AC
  Administered 2015-02-19: 10 mL/h via INTRAVENOUS

## 2015-02-19 MED ORDER — PROCHLORPERAZINE EDISYLATE 5 MG/ML IJ SOLN
10.0000 mg | Freq: Once | INTRAMUSCULAR | Status: AC
  Administered 2015-02-19: 10 mg via INTRAVENOUS
  Filled 2015-02-19: qty 2

## 2015-02-19 MED ORDER — DEXAMETHASONE SODIUM PHOSPHATE 10 MG/ML IJ SOLN
10.0000 mg | Freq: Once | INTRAMUSCULAR | Status: AC
  Administered 2015-02-19: 10 mg via INTRAVENOUS
  Filled 2015-02-19: qty 1

## 2015-02-19 MED ORDER — MECLIZINE HCL 25 MG PO TABS
25.0000 mg | ORAL_TABLET | Freq: Once | ORAL | Status: AC
  Administered 2015-02-19: 25 mg via ORAL
  Filled 2015-02-19: qty 1

## 2015-02-19 MED ORDER — LORAZEPAM 2 MG/ML IJ SOLN
1.0000 mg | Freq: Once | INTRAMUSCULAR | Status: AC
  Administered 2015-02-19: 1 mg via INTRAVENOUS
  Filled 2015-02-19: qty 1

## 2015-02-19 MED ORDER — SODIUM CHLORIDE 0.9 % IV BOLUS (SEPSIS)
1000.0000 mL | Freq: Once | INTRAVENOUS | Status: AC
  Administered 2015-02-19: 1000 mL via INTRAVENOUS

## 2015-02-19 MED ORDER — DIPHENHYDRAMINE HCL 50 MG/ML IJ SOLN
25.0000 mg | Freq: Once | INTRAMUSCULAR | Status: AC
  Administered 2015-02-19: 25 mg via INTRAVENOUS
  Filled 2015-02-19: qty 1

## 2015-02-19 MED ORDER — DIAZEPAM 5 MG PO TABS: 2.5000 mg | ORAL_TABLET | Freq: Three times a day (TID) | ORAL | Status: DC | PRN

## 2015-02-19 MED ORDER — MECLIZINE HCL 25 MG PO TABS: 25.0000 mg | ORAL_TABLET | Freq: Two times a day (BID) | ORAL | Status: DC | PRN

## 2015-02-19 MED ORDER — KETOROLAC TROMETHAMINE 30 MG/ML IJ SOLN
15.0000 mg | Freq: Once | INTRAMUSCULAR | Status: AC
  Administered 2015-02-19: 15 mg via INTRAVENOUS
  Filled 2015-02-19: qty 1

## 2015-02-19 NOTE — ED Notes (Signed)
MD at bedside. 

## 2015-02-19 NOTE — ED Provider Notes (Signed)
CSN: [id number]     Arrival date & time 02/19/15  16 History   First MD Initiated Contact with Patient 02/19/15 1059     No chief complaint on file.    (Consider location/radiation/quality/duration/timing/severity/associated sxs/prior Treatment) Patient is a 64 y.o. male presenting with dizziness.  Dizziness Quality:  Lightheadedness and room spinning Severity:  Mild Onset quality:  Gradual Duration:  3 days Timing:  Constant Progression:  Worsening Chronicity:  New Context: not when bending over, not with ear pain and not with inactivity   Relieved by:  Being still Worsened by:  Sitting upright, standing up and turning head Associated symptoms: no blood in stool, no chest pain, no diarrhea, no nausea, no palpitations and no shortness of breath     Past Medical History  Diagnosis Date  . Essential hypertension, benign   . Mixed hyperlipidemia   . Personal history of colonic polyps   . Lumbago   . Kidney stones   . Arthritis   . Non Hodgkin's lymphoma 2002    a. stage IV  . CAD (coronary artery disease)     a. DES to OM 1 and DES mLCX (12/05/13) b. CABG x1  ('92) c. 6 stents placed in 2009? (pt hx)    Past Surgical History  Procedure Laterality Date  . Coronary artery bypass graft  1992    "CABG X1"  . Coronary angioplasty with stent placement  ~ 1999; ~ 2009 X 2; 12/05/2013    "1 +3 + 2 + 2"  . Tonsillectomy    . Inguinal hernia repair    . Exploratory laparotomy with abdominal mass excision  08/2000    "looking for cancer"  . Cholecystectomy  08/2000  . Cystostomy w/ stent insertion    . Cystoscopy w/ ureteroscopy w/ lithotripsy    . Left heart catheterization with coronary angiogram N/A 12/05/2013    Procedure: LEFT HEART CATHETERIZATION WITH CORONARY ANGIOGRAM;  Surgeon: Blane Ohara, MD;  Location: [facility] CATH LAB;  Service: Cardiovascular;  Laterality: N/A;  . Percutaneous coronary stent intervention (pci-s)  12/05/2013    Procedure: PERCUTANEOUS CORONARY STENT  INTERVENTION (PCI-S);  Surgeon: Blane Ohara, MD;  Location: [facility] CATH LAB;  Service: Cardiovascular;;  mid circumflex and prox OM2   Family History  Problem Relation Age of Onset  . Breast cancer Mother   . Colon cancer Father    Social History  Substance Use Topics  . Smoking status: Never Smoker   . Smokeless tobacco: Current User    Types: Snuff     Comment: 12/05/2013 "use snuff q now and again"  . Alcohol Use: 0.0 oz/week    0 Standard drinks or equivalent per week     Comment: 12/05/2013 "maybe 2 cans beer/yr"    Review of Systems  Constitutional: Negative for fever and chills.  Respiratory: Negative for cough and shortness of breath.   Cardiovascular: Negative for chest pain and palpitations.  Gastrointestinal: Negative for nausea, diarrhea and blood in stool.  Skin: Negative for pallor and wound.  Neurological: Positive for dizziness.  All other systems reviewed and are negative.     Allergies  Ivp dye  Home Medications   Prior to Admission medications   Medication Sig Start Date End Date Taking? Authorizing Provider  albuterol (PROVENTIL HFA;VENTOLIN HFA) 108 (90 BASE) MCG/ACT inhaler Inhale 2 puffs into the lungs every 4 (four) hours as needed for wheezing or shortness of breath. 10/31/14   Janne Napoleon, NP  aspirin 81 MG tablet  Take 81 mg by mouth daily.    Historical Provider, MD  cefdinir (OMNICEF) 300 MG capsule Take 1 capsule (300 mg total) by mouth 2 (two) times daily. 10/31/14   Janne Napoleon, NP  clopidogrel (PLAVIX) 75 MG tablet Take 75 mg by mouth daily.    Historical Provider, MD  cyclobenzaprine (FLEXERIL) 10 MG tablet Take 10 mg by mouth 3 (three) times daily as needed for muscle spasms.    Historical Provider, MD  ipratropium (ATROVENT) 0.06 % nasal spray Place 2 sprays into both nostrils 4 (four) times daily. 10/31/14   Janne Napoleon, NP  metoprolol succinate (TOPROL-XL) 25 MG 24 hr tablet Take 1 tablet (25 mg total) by mouth 2 (two) times daily. 09/11/14    Herminio Commons, MD  Multiple Vitamin (MULTIVITAMIN) tablet Take 1 tablet by mouth daily.    Historical Provider, MD  predniSONE (DELTASONE) 20 MG tablet Take 3 tabs po on first day, 2 tabs second day, 2 tabs third day, 1 tab fourth day, 1 tab 5th day. Take with food. Start 11-01-14. 10/31/14   Janne Napoleon, NP  rosuvastatin (CRESTOR) 20 MG tablet Take 20 mg by mouth daily.    Historical Provider, MD  vitamin B-12 (CYANOCOBALAMIN) 500 MCG tablet Take 500 mcg by mouth daily.    Historical Provider, MD   There were no vitals taken for this visit. Physical Exam  Constitutional: He is oriented to person, place, and time. He appears well-developed and well-nourished.  HENT:  Head: Normocephalic and atraumatic.  Eyes: Conjunctivae and EOM are normal.  Neck: Normal range of motion. Neck supple.  Cardiovascular: Normal rate and regular rhythm.   Pulmonary/Chest: Effort normal. No respiratory distress.  Abdominal: Soft. There is no tenderness.  Musculoskeletal: Normal range of motion. He exhibits no edema or tenderness.  Neurological: He is alert and oriented to person, place, and time.  No altered mental status, able to give full seemingly accurate history.  Face is symmetric, EOM's intact with slight horizontal nystagmus , pupils equal and reactive, vision intact, tongue and uvula midline without deviation Upper and Lower extremity motor 5/5, intact pain perception in distal extremities, 2+ reflexes in biceps, patella and achilles tendons. Finger to nose normal, heel to shin normal. Walks without assistance but does have some slight sideways gait.  Skin: Skin is warm and dry.  Nursing note and vitals reviewed.   ED Course  Procedures (including critical care time) Labs Review Labs Reviewed - No data to display  Imaging Review No results found. I have personally reviewed and evaluated these images and lab results as part of my medical decision-making.   EKG Interpretation None       MDM   Final diagnoses:  Vertigo   Patient with gradual onset of vertigo over last couple days. Also with sinus congestion over last week. No fever, some nausea. Exam with horizongal nystagmus, slight ataxia with gait. Tried meclizine initially without improvement. 2/2 multiple neuro findings, MRI done to eval for CVA and negative. Headache cocktail and ativan helped alleviate some symptoms. Able to ambulate, slowly at time of discharge. Possibly vestibular neuritis.   I have personally and contemperaneously reviewed labs and imaging and used in my decision making as above.   A medical screening exam was performed and I feel the patient has had an appropriate workup for their chief complaint at this time and likelihood of emergent condition existing is low. They have been counseled on decision, discharge, follow up and which symptoms necessitate immediate  return to the emergency department. They or their family verbally stated understanding and agreement with plan and discharged in stable condition.      Merrily Pew, MD 02/21/15 [phone]

## 2015-02-19 NOTE — ED Notes (Signed)
Pt reports nasal congestion and feeling off balance since Saturday. Pt saw his PCP who gave him Claritin and nasal spray. Pt reports he has not had any improvement. Pt ambulatory to treatment room but reports he feels off balance.

## 2015-03-29 ENCOUNTER — Encounter: Payer: Self-pay | Admitting: Cardiovascular Disease

## 2015-03-29 ENCOUNTER — Ambulatory Visit (INDEPENDENT_AMBULATORY_CARE_PROVIDER_SITE_OTHER): Payer: BLUE CROSS/BLUE SHIELD | Admitting: Cardiovascular Disease

## 2015-03-29 VITALS — BP 122/80 | HR 69 | Ht 71.0 in | Wt 229.0 lb

## 2015-03-29 DIAGNOSIS — E785 Hyperlipidemia, unspecified: Secondary | ICD-10-CM

## 2015-03-29 DIAGNOSIS — I10 Essential (primary) hypertension: Secondary | ICD-10-CM

## 2015-03-29 DIAGNOSIS — I25810 Atherosclerosis of coronary artery bypass graft(s) without angina pectoris: Secondary | ICD-10-CM | POA: Diagnosis not present

## 2015-03-29 NOTE — Progress Notes (Signed)
Patient ID: Ryan Vaughn, male   DOB: May 22, 1951, 64 y.o.   MRN: [id number]      SUBJECTIVE: The patient is here to follow up for CAD. He successfully underwent PCI of the first OM and mid circumflex using drug-eluting stents on 12/05/13.  His daughter, [name], is an Therapist, sports and works at [employer]. He has another daughter who is a respiratory therapist at [employer].  The patient denies any symptoms of chest pain, palpitations, shortness of breath, lightheadedness, dizziness, leg swelling, orthopnea, PND, and syncope.  He just returned from a week of deer hunting. He has lost 10 pounds in the past month. He stopped eating hamburgers, french fries, potato chips , potatoes, and soft drinks. He eats yogurt now. He was told by his PCP he was a borderline diabetic and has been motivated to change.   Review of Systems: As per "subjective", otherwise negative.  Allergies  Allergen Reactions  . Ivp Dye [Iodinated Diagnostic Agents] Other (See Comments)    headache    Current Outpatient Prescriptions  Medication Sig Dispense Refill  . aspirin 81 MG tablet Take 81 mg by mouth daily.    . clopidogrel (PLAVIX) 75 MG tablet Take 75 mg by mouth daily.    . cyclobenzaprine (FLEXERIL) 10 MG tablet Take 10 mg by mouth 3 (three) times daily as needed for muscle spasms.    Marland Kitchen ipratropium (ATROVENT) 0.06 % nasal spray Place 2 sprays into both nostrils 4 (four) times daily. 15 mL 0  . metoprolol succinate (TOPROL-XL) 25 MG 24 hr tablet Take 1 tablet (25 mg total) by mouth 2 (two) times daily. 180 tablet 3  . Multiple Vitamin (MULTIVITAMIN) tablet Take 1 tablet by mouth daily.    . rosuvastatin (CRESTOR) 20 MG tablet Take 20 mg by mouth daily.    . vitamin B-12 (CYANOCOBALAMIN) 500 MCG tablet Take 500 mcg by mouth daily.     No current facility-administered medications for this visit.    Past Medical History  Diagnosis Date  . Essential hypertension, benign   . Mixed hyperlipidemia   . Personal  history of colonic polyps   . Lumbago   . Kidney stones   . Arthritis   . Non Hodgkin's lymphoma ([place]) 2002    a. stage IV  . CAD (coronary artery disease)     a. DES to OM 1 and DES mLCX (12/05/13) b. CABG x1  ('92) c. 6 stents placed in 2009? (pt hx)     Past Surgical History  Procedure Laterality Date  . Coronary artery bypass graft  1992    "CABG X1"  . Coronary angioplasty with stent placement  ~ 1999; ~ 2009 X 2; 12/05/2013    "1 +3 + 2 + 2"  . Tonsillectomy    . Inguinal hernia repair    . Exploratory laparotomy with abdominal mass excision  08/2000    "looking for cancer"  . Cholecystectomy  08/2000  . Cystostomy w/ stent insertion    . Cystoscopy w/ ureteroscopy w/ lithotripsy    . Left heart catheterization with coronary angiogram N/A 12/05/2013    Procedure: LEFT HEART CATHETERIZATION WITH CORONARY ANGIOGRAM;  Surgeon: Blane Ohara, MD;  Location: [facility] CATH LAB;  Service: Cardiovascular;  Laterality: N/A;  . Percutaneous coronary stent intervention (pci-s)  12/05/2013    Procedure: PERCUTANEOUS CORONARY STENT INTERVENTION (PCI-S);  Surgeon: Blane Ohara, MD;  Location: [facility] CATH LAB;  Service: Cardiovascular;;  mid circumflex and prox OM2    Social  History   Social History  . Marital Status: Single    Spouse Name: N/A  . Number of Children: N/A  . Years of Education: N/A   Occupational History  . Not on file.   Social History Main Topics  . Smoking status: Never Smoker   . Smokeless tobacco: Current User    Types: Snuff     Comment: 12/05/2013 "use snuff q now and again"  . Alcohol Use: 0.0 oz/week    0 Standard drinks or equivalent per week     Comment: 12/05/2013 "maybe 2 cans beer/yr"  . Drug Use: No  . Sexual Activity: Yes   Other Topics Concern  . Not on file   Social History Narrative     Filed Vitals:   03/29/15 1538  BP: 122/80  Pulse: 69  Height: 5\' 11"  (1.803 m)  Weight: 229 lb (103.874 kg)  SpO2: 94%    PHYSICAL EXAM General:  NAD HEENT: Normal. Neck: No JVD, no thyromegaly. Lungs: Clear to auscultation bilaterally with normal respiratory effort. CV: Nondisplaced PMI.  Regular rate and rhythm, normal S1/S2, no S3/S4, no murmur. No pretibial or periankle edema.  No carotid bruit.  Normal pedal pulses.  Abdomen: Soft, nontender, no hepatosplenomegaly, no distention.  Neurologic: Alert and oriented x 3.  Psych: Normal affect. Skin: Normal. Musculoskeletal: Normal range of motion, no gross deformities. Extremities: No clubbing or cyanosis.   ECG: Most recent ECG reviewed.    ASSESSMENT AND PLAN: 1. CAD s/p 1-vessel CABG s/p PCI of first OM and mid LCx: Symptomatically stable. Continue ASA, Toprol-XL, Plavix (multiple stents and tolerating this medication) and Crestor.   2. Essential HTN: Controlled. No changes.  3. Hyperlipidemia: Obtain copy of lipids. Continue Crestor 20 mg daily.   Dispo: f/u 1 year.  Kate Sable, M.D., F.A.C.C.

## 2015-03-29 NOTE — Patient Instructions (Signed)
Continue all current medications. Your physician wants you to follow up in:  1 year.  You will receive a reminder letter in the mail one-two months in advance.  If you don't receive a letter, please call our office to schedule the follow up appointment   

## 2015-04-07 IMAGING — CR DG HAND COMPLETE 3+V*L*
3 series · 3 of 3 positions shown · non-contrast
Comparison: None.

CLINICAL DATA: Hit hand with hammer, now with pain involving the
thumb and second finger

LEFT HAND - COMPLETE 3+ VIEW

[x hand pa left]
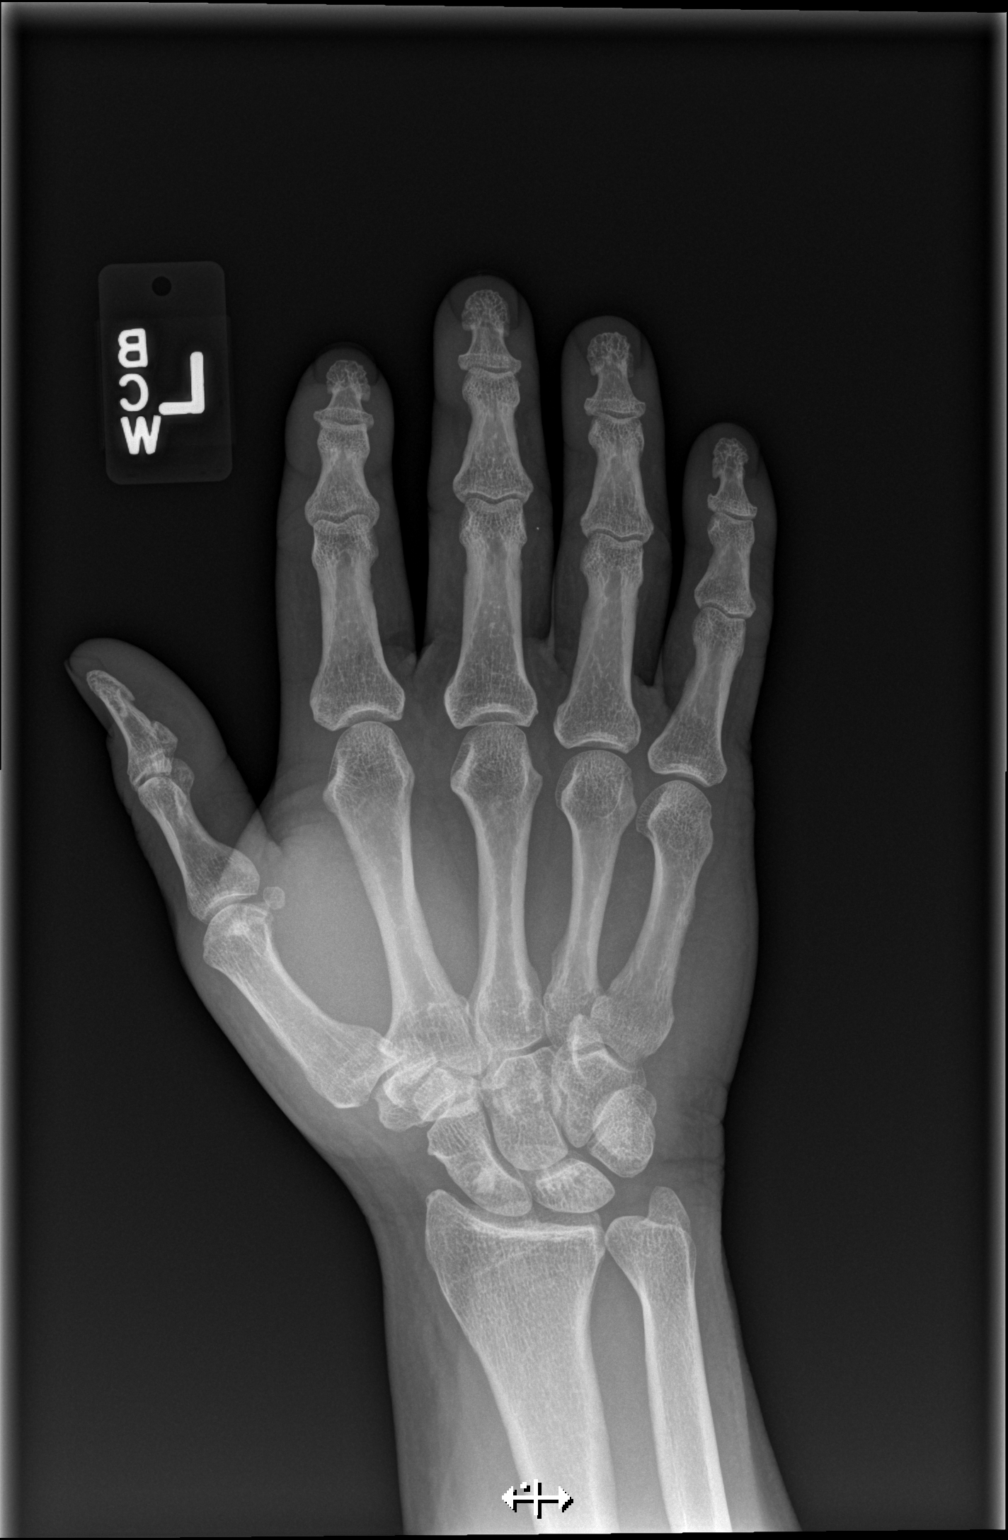

[x hand obl left]
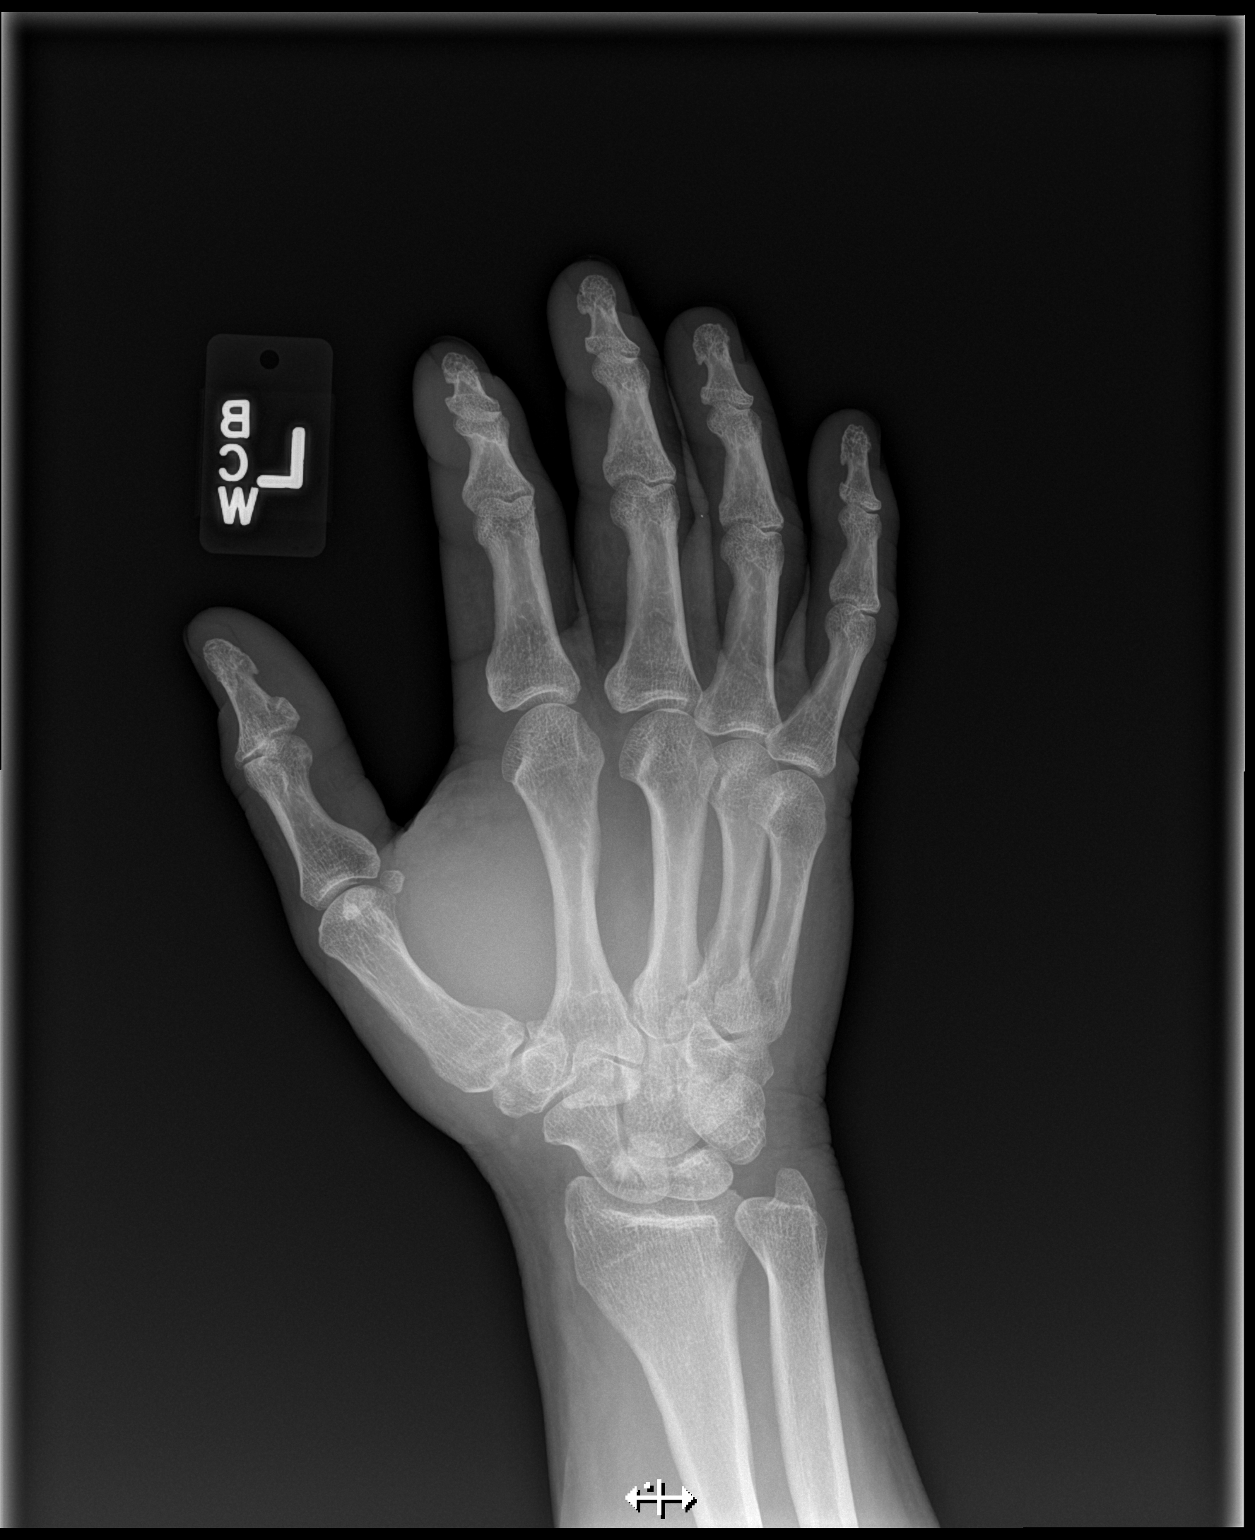

[x hand lat left]
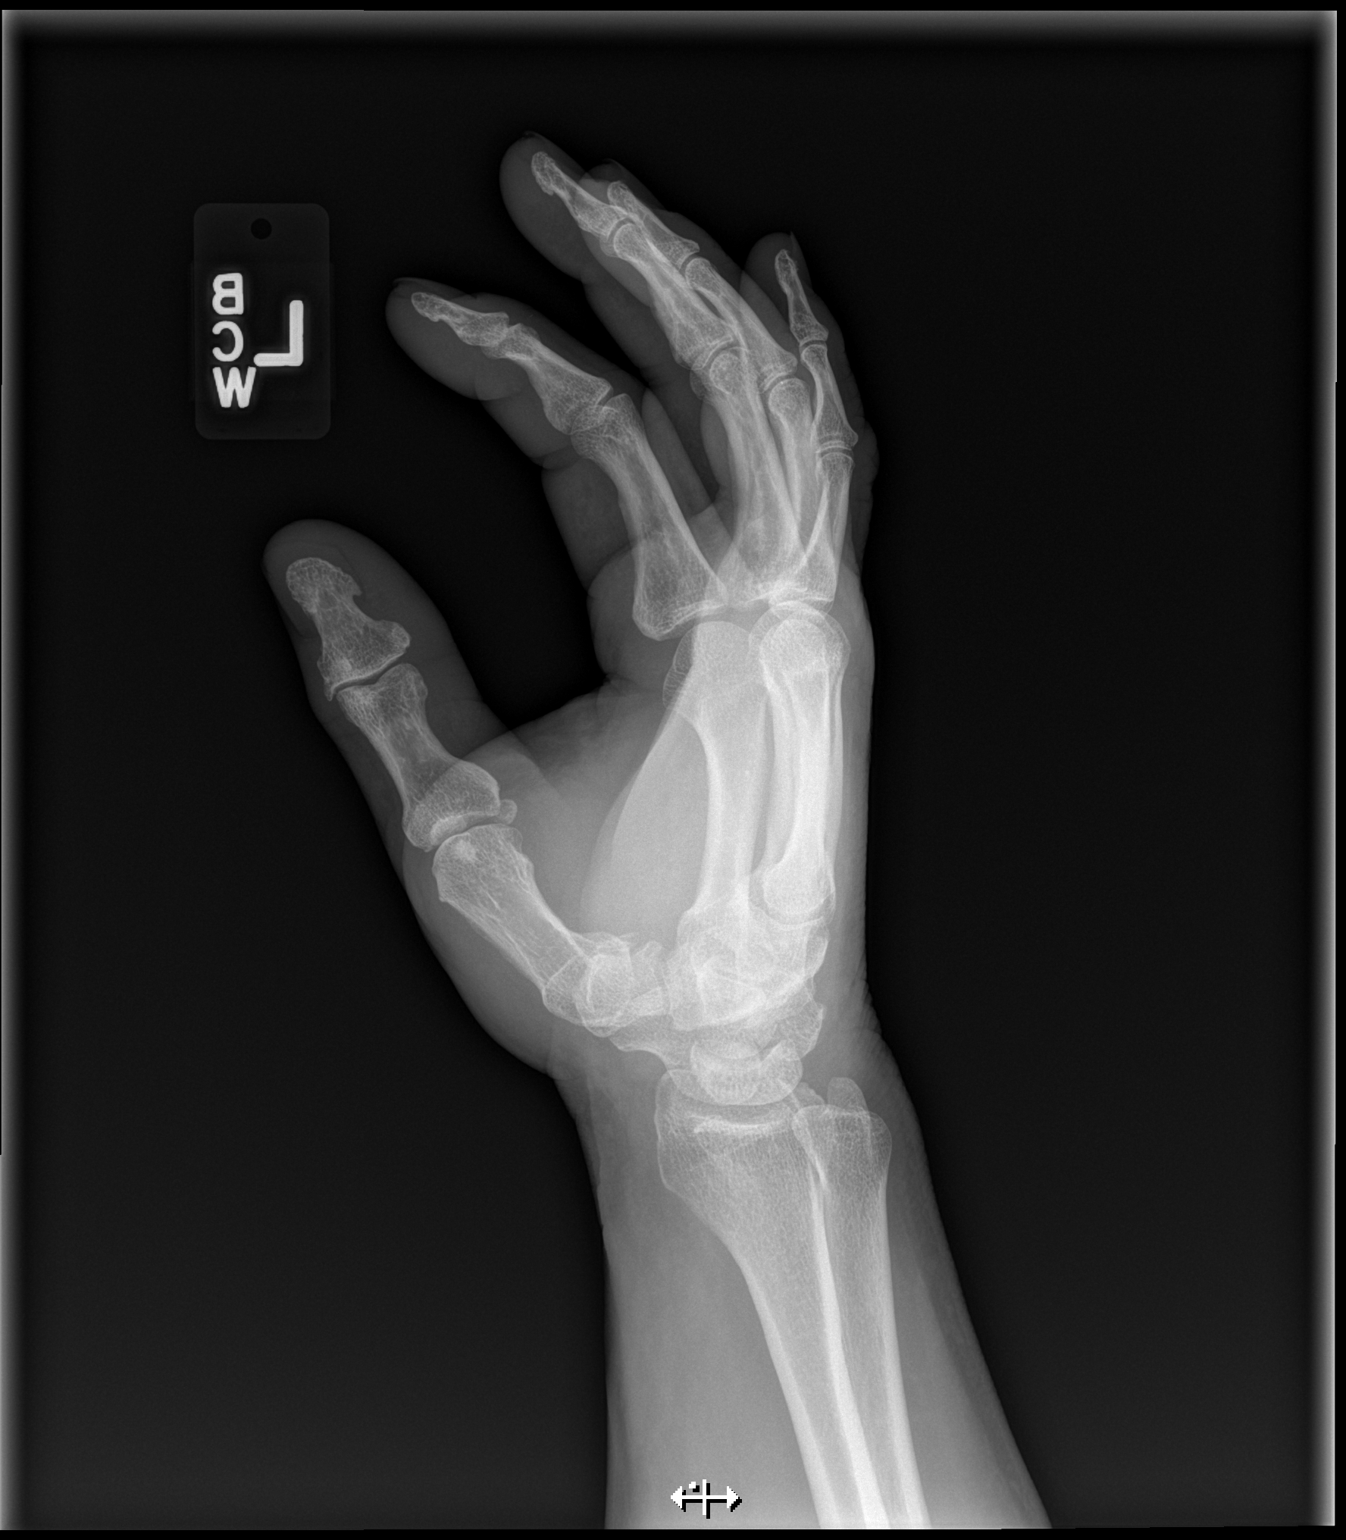

[3 of 3 positions shown; findings below may reference images not displayed]

FINDINGS: The provided anterior projection radiograph is degraded due to the
fingers being held in a minimal degree of flexion.

No fracture or dislocation.  Joint spaces are preserved.  No
erosions.  Regional soft tissues are normal.  No radiopaque foreign
body.
IMPRESSION: No fracture or radiopaque foreign body.

## 2015-07-14 ENCOUNTER — Inpatient Hospital Stay (HOSPITAL_COMMUNITY)
Admission: EM | Admit: 2015-07-14 | Discharge: 2015-07-17 | DRG: 247 | Disposition: A | Discharge status: Home / Self Care | Payer: BLUE CROSS/BLUE SHIELD | Attending: Internal Medicine | Admitting: Internal Medicine

## 2015-07-14 ENCOUNTER — Telehealth: Payer: Self-pay | Admitting: *Deleted

## 2015-07-14 ENCOUNTER — Encounter (HOSPITAL_COMMUNITY): Payer: Self-pay | Admitting: Emergency Medicine

## 2015-07-14 ENCOUNTER — Emergency Department (HOSPITAL_COMMUNITY): Payer: BLUE CROSS/BLUE SHIELD

## 2015-07-14 DIAGNOSIS — I25110 Atherosclerotic heart disease of native coronary artery with unstable angina pectoris: Secondary | ICD-10-CM | POA: Diagnosis present

## 2015-07-14 DIAGNOSIS — R079 Chest pain, unspecified: Secondary | ICD-10-CM

## 2015-07-14 DIAGNOSIS — I071 Rheumatic tricuspid insufficiency: Secondary | ICD-10-CM | POA: Diagnosis present

## 2015-07-14 DIAGNOSIS — Z955 Presence of coronary angioplasty implant and graft: Secondary | ICD-10-CM

## 2015-07-14 DIAGNOSIS — Z8572 Personal history of non-Hodgkin lymphomas: Secondary | ICD-10-CM

## 2015-07-14 DIAGNOSIS — Z951 Presence of aortocoronary bypass graft: Secondary | ICD-10-CM

## 2015-07-14 DIAGNOSIS — T82855A Stenosis of coronary artery stent, initial encounter: Principal | ICD-10-CM | POA: Diagnosis present

## 2015-07-14 DIAGNOSIS — E785 Hyperlipidemia, unspecified: Secondary | ICD-10-CM | POA: Diagnosis present

## 2015-07-14 DIAGNOSIS — Y831 Surgical operation with implant of artificial internal device as the cause of abnormal reaction of the patient, or of later complication, without mention of misadventure at the time of the procedure: Secondary | ICD-10-CM | POA: Diagnosis present

## 2015-07-14 DIAGNOSIS — E782 Mixed hyperlipidemia: Secondary | ICD-10-CM | POA: Diagnosis present

## 2015-07-14 DIAGNOSIS — I119 Hypertensive heart disease without heart failure: Secondary | ICD-10-CM | POA: Diagnosis present

## 2015-07-14 DIAGNOSIS — M545 Low back pain: Secondary | ICD-10-CM | POA: Diagnosis present

## 2015-07-14 DIAGNOSIS — I200 Unstable angina: Secondary | ICD-10-CM | POA: Diagnosis present

## 2015-07-14 DIAGNOSIS — K6389 Other specified diseases of intestine: Secondary | ICD-10-CM

## 2015-07-14 DIAGNOSIS — Z7902 Long term (current) use of antithrombotics/antiplatelets: Secondary | ICD-10-CM

## 2015-07-14 DIAGNOSIS — Z7982 Long term (current) use of aspirin: Secondary | ICD-10-CM

## 2015-07-14 DIAGNOSIS — I10 Essential (primary) hypertension: Secondary | ICD-10-CM | POA: Diagnosis present

## 2015-07-14 LAB — BASIC METABOLIC PANEL
Anion gap: 13 (ref 5–15)
BUN: 13 mg/dL (ref 6–20)
CHLORIDE: 102 mmol/L (ref 101–111)
CO2: 22 mmol/L (ref 22–32)
Calcium: 9.5 mg/dL (ref 8.9–10.3)
Creatinine, Ser: 1.14 mg/dL (ref 0.61–1.24)
GFR calc Af Amer: >60 mL/min (ref >60)
Glucose, Bld: 147 mg/dL — ABNORMAL HIGH (ref 65–99)
Potassium: 3.8 mmol/L (ref 3.5–5.1)
Sodium: 137 mmol/L (ref 135–145)

## 2015-07-14 LAB — I-STAT TROPONIN, ED: Troponin i, poc: 0.01 ng/mL (ref 0.00–0.08)

## 2015-07-14 LAB — CBC
HCT: 44.2 % (ref 39.0–52.0)
HEMOGLOBIN: 14.7 g/dL (ref 13.0–17.0)
MCH: 29.9 pg (ref 26.0–34.0)
MCHC: 33.3 g/dL (ref 30.0–36.0)
MCV: 90 fL (ref 78.0–100.0)
Platelets: 187 10*3/uL (ref 150–400)
RBC: 4.91 MIL/uL (ref 4.22–5.81)
RDW: 13.2 % (ref 11.5–15.5)
WBC: 5.2 10*3/uL (ref 4.0–10.5)

## 2015-07-14 MED ORDER — ACETAMINOPHEN 325 MG PO TABS
650.0000 mg | ORAL_TABLET | ORAL | Status: DC | PRN
  Administered 2015-07-14 – 2015-07-15 (×2): 650 mg via ORAL
  Filled 2015-07-14 (×2): qty 2

## 2015-07-14 MED ORDER — HEPARIN SODIUM (PORCINE) 5000 UNIT/ML IJ SOLN: 4000.0000 [IU] | Freq: Once | INTRAMUSCULAR | Status: DC

## 2015-07-14 MED ORDER — METOPROLOL SUCCINATE ER 50 MG PO TB24
50.0000 mg | ORAL_TABLET | Freq: Two times a day (BID) | ORAL | Status: DC
Start: 2015-07-14 — End: 2015-07-17
  Administered 2015-07-14 – 2015-07-16 (×4): 50 mg via ORAL
  Filled 2015-07-14 (×6): qty 1

## 2015-07-14 MED ORDER — ROSUVASTATIN CALCIUM 20 MG PO TABS
20.0000 mg | ORAL_TABLET | Freq: Every day | ORAL | Status: DC
  Administered 2015-07-15 – 2015-07-16 (×2): 20 mg via ORAL
  Filled 2015-07-14: qty 2
  Filled 2015-07-14: qty 1
  Filled 2015-07-14: qty 2

## 2015-07-14 MED ORDER — CYCLOBENZAPRINE HCL 10 MG PO TABS: 10.0000 mg | ORAL_TABLET | Freq: Three times a day (TID) | ORAL | Status: DC | PRN

## 2015-07-14 MED ORDER — HEPARIN (PORCINE) IN NACL 100-0.45 UNIT/ML-% IJ SOLN
1450.0000 [IU]/h | INTRAMUSCULAR | Status: DC
  Administered 2015-07-14: 1350 [IU]/h via INTRAVENOUS
  Administered 2015-07-15 – 2015-07-16 (×2): 1450 [IU]/h via INTRAVENOUS
  Filled 2015-07-14 (×3): qty 250

## 2015-07-14 MED ORDER — ASPIRIN 81 MG PO CHEW
324.0000 mg | CHEWABLE_TABLET | Freq: Once | ORAL | Status: AC
  Administered 2015-07-14: 324 mg via ORAL
  Filled 2015-07-14: qty 4

## 2015-07-14 MED ORDER — CLOPIDOGREL BISULFATE 75 MG PO TABS
75.0000 mg | ORAL_TABLET | Freq: Every day | ORAL | Status: DC
  Administered 2015-07-15 – 2015-07-16 (×2): 75 mg via ORAL
  Filled 2015-07-14 (×3): qty 1

## 2015-07-14 MED ORDER — NITROGLYCERIN 0.4 MG SL SUBL: 0.4000 mg | SUBLINGUAL_TABLET | SUBLINGUAL | Status: DC | PRN

## 2015-07-14 MED ORDER — HEPARIN BOLUS VIA INFUSION
4000.0000 [IU] | Freq: Once | INTRAVENOUS | Status: AC
  Administered 2015-07-14: 4000 [IU] via INTRAVENOUS
  Filled 2015-07-14: qty 4000

## 2015-07-14 MED ORDER — HEPARIN (PORCINE) IN NACL 100-0.45 UNIT/ML-% IJ SOLN: 15.0000 [IU]/kg/h | Freq: Once | INTRAMUSCULAR | Status: DC

## 2015-07-14 MED ORDER — ASPIRIN EC 81 MG PO TBEC
81.0000 mg | DELAYED_RELEASE_TABLET | Freq: Every day | ORAL | Status: DC
  Administered 2015-07-15: 81 mg via ORAL
  Filled 2015-07-14: qty 1

## 2015-07-14 MED ORDER — ONDANSETRON HCL 4 MG/2ML IJ SOLN
4.0000 mg | Freq: Four times a day (QID) | INTRAMUSCULAR | Status: DC | PRN
  Filled 2015-07-14: qty 2

## 2015-07-14 MED ORDER — NITROGLYCERIN IN D5W 200-5 MCG/ML-% IV SOLN
5.0000 ug/min | Freq: Once | INTRAVENOUS | Status: AC
  Administered 2015-07-14: 5 ug/min via INTRAVENOUS
  Filled 2015-07-14: qty 250

## 2015-07-14 MED ORDER — MORPHINE SULFATE (PF) 4 MG/ML IV SOLN
4.0000 mg | Freq: Once | INTRAVENOUS | Status: AC
  Administered 2015-07-14: 4 mg via INTRAVENOUS
  Filled 2015-07-14: qty 1

## 2015-07-14 NOTE — ED Notes (Signed)
Heparin verified by Annabell Sabal

## 2015-07-14 NOTE — Telephone Encounter (Signed)
Patient c/o of chest pain rated 5/10, with dizziness and sweating. Patient does not have nitroglycerin on hand. Patient advised to go to the ED for an evaluation. Patient verbalized understanding of plan.

## 2015-07-14 NOTE — Consult Note (Signed)
ANTICOAGULATION CONSULT NOTE - Initial Consult  Pharmacy Consult for Heparin Indication: chest pain/ACS  Allergies  Allergen Reactions  . Ivp Dye [Iodinated Diagnostic Agents] Other (See Comments)    headache    Patient Measurements: Height: 5\' 11"  (180.3 cm) Weight: 230 lb (104.327 kg) IBW/kg (Calculated) : 75.3 Heparin Dosing Weight: 96.8kg  Vital Signs: Temp: 98 F (36.7 C) (02/22 1243) Temp Source: Oral (02/22 1243) BP: 111/77 mmHg (02/22 1915) Pulse Rate: 64 (02/22 1915)  Labs:  Recent Labs  07/14/15 1251  HGB 14.7  HCT 44.2  PLT 187  CREATININE 1.14    Estimated Creatinine Clearance: 82.6 mL/min (by C-G formula based on Cr of 1.14).   Medical History: Past Medical History  Diagnosis Date  . Essential hypertension, benign   . Mixed hyperlipidemia   . Personal history of colonic polyps   . Lumbago   . Kidney stones   . Arthritis   . Non Hodgkin's lymphoma ([place]) 2002    a. stage IV  . CAD (coronary artery disease)     a. DES to OM 1 and DES mLCX (12/05/13) b. CABG x1  ('92) c. 6 stents placed in 2009? (pt hx)     Medications:  No anticoagulants pta  Assessment: 64yom with hx CABG and multiple stents presents to the ED with left sided chest pain with radiation to left arm, dizziness, and weakness. He will begin IV heparin. Baseline labs wnl.  Goal of Therapy:  Heparin level 0.3-0.7 units/ml Monitor platelets by anticoagulation protocol: Yes   Plan:  1) Heparin bolus 4000 units x 1 2) Heparin drip at 1350 units/hr 3) Check 6 hour heparin level 4) Daily heparin level and CBC  Deboraha Sprang 07/14/2015,9:27 PM

## 2015-07-14 NOTE — ED Provider Notes (Signed)
CSN: [id number]     Arrival date & time 07/14/15  1231 History   First MD Initiated Contact with Patient 07/14/15 1611     Chief Complaint  Patient presents with  . Chest Pain  . Dizziness   Patient is a 65 y.o. male presenting with chest pain. The history is provided by the patient and the spouse. No language interpreter was used.  Chest Pain Pain location:  L chest Pain quality: aching, burning and pressure   Pain radiates to:  L arm Pain radiates to the back: no   Pain severity:  Severe Onset quality:  Gradual Duration:  5 hours Timing:  Intermittent Progression:  Partially resolved Chronicity:  Recurrent Context: at rest   Context: not raising an arm   Relieved by:  None tried Worsened by:  Nothing tried Ineffective treatments:  None tried Associated symptoms: abdominal pain, cough, diaphoresis and nausea   Associated symptoms: no back pain, no dizziness, no dysphagia, no fever, no headache, no numbness, no palpitations, no shortness of breath, not vomiting and no weakness   Risk factors: coronary artery disease, hypertension and male sex     Past Medical History  Diagnosis Date  . Essential hypertension, benign   . Mixed hyperlipidemia   . Personal history of colonic polyps   . Lumbago   . Kidney stones   . Arthritis   . Non Hodgkin's lymphoma ([place]) 2002    a. stage IV  . CAD (coronary artery disease)     a. DES to OM 1 and DES mLCX (12/05/13) b. CABG x1  ('92) c. 6 stents placed in 2009? (pt hx)    Past Surgical History  Procedure Laterality Date  . Coronary artery bypass graft  1992    "CABG X1"  . Coronary angioplasty with stent placement  ~ 1999; ~ 2009 X 2; 12/05/2013    "1 +3 + 2 + 2"  . Tonsillectomy    . Inguinal hernia repair    . Exploratory laparotomy with abdominal mass excision  08/2000    "looking for cancer"  . Cholecystectomy  08/2000  . Cystostomy w/ stent insertion    . Cystoscopy w/ ureteroscopy w/ lithotripsy    . Left heart catheterization  with coronary angiogram N/A 12/05/2013    Procedure: LEFT HEART CATHETERIZATION WITH CORONARY ANGIOGRAM;  Surgeon: Blane Ohara, MD;  Location: [facility] CATH LAB;  Service: Cardiovascular;  Laterality: N/A;  . Percutaneous coronary stent intervention (pci-s)  12/05/2013    Procedure: PERCUTANEOUS CORONARY STENT INTERVENTION (PCI-S);  Surgeon: Blane Ohara, MD;  Location: [facility] CATH LAB;  Service: Cardiovascular;;  mid circumflex and prox OM2   Family History  Problem Relation Age of Onset  . Breast cancer Mother   . Colon cancer Father    Social History  Substance Use Topics  . Smoking status: Never Smoker   . Smokeless tobacco: Current User    Types: Snuff     Comment: 12/05/2013 "use snuff q now and again"  . Alcohol Use: 0.0 oz/week    0 Standard drinks or equivalent per week     Comment: 12/05/2013 "maybe 2 cans beer/yr"    Review of Systems  Constitutional: Positive for diaphoresis. Negative for fever, chills, activity change and appetite change.  HENT: Negative for congestion, dental problem, ear pain, facial swelling, hearing loss, rhinorrhea, sneezing, sore throat, trouble swallowing and voice change.   Eyes: Negative for photophobia, pain, redness and visual disturbance.  Respiratory: Positive for cough. Negative for  apnea, chest tightness, shortness of breath, wheezing and stridor.   Cardiovascular: Positive for chest pain. Negative for palpitations and leg swelling.  Gastrointestinal: Positive for nausea and abdominal pain. Negative for vomiting, diarrhea, constipation, blood in stool and abdominal distention.  Endocrine: Negative for polydipsia and polyuria.  Genitourinary: Negative for frequency, hematuria, flank pain, decreased urine volume and difficulty urinating.  Musculoskeletal: Negative for back pain, joint swelling, gait problem, neck pain and neck stiffness.  Skin: Negative for rash and wound.  Allergic/Immunologic: Negative for immunocompromised state.   Neurological: Negative for dizziness, syncope, facial asymmetry, speech difficulty, weakness, light-headedness, numbness and headaches.  Hematological: Negative for adenopathy.  Psychiatric/Behavioral: Negative for suicidal ideas, behavioral problems, confusion, sleep disturbance and agitation. The patient is not nervous/anxious.   All other systems reviewed and are negative.     Allergies  Ivp dye  Home Medications   Prior to Admission medications   Medication Sig Start Date End Date Taking? Authorizing Provider  aspirin 81 MG tablet Take 81 mg by mouth daily.   Yes Historical Provider, MD  clopidogrel (PLAVIX) 75 MG tablet Take 75 mg by mouth daily.   Yes Historical Provider, MD  cyclobenzaprine (FLEXERIL) 10 MG tablet Take 10 mg by mouth 3 (three) times daily as needed for muscle spasms.   Yes Historical Provider, MD  metoprolol succinate (TOPROL-XL) 50 MG 24 hr tablet Take 50 mg by mouth 2 (two) times daily. Take with or immediately following a meal.   Yes Historical Provider, MD  Multiple Vitamin (MULTIVITAMIN) tablet Take 1 tablet by mouth daily.   Yes Historical Provider, MD  rosuvastatin (CRESTOR) 20 MG tablet Take 20 mg by mouth daily.   Yes Historical Provider, MD  vitamin B-12 (CYANOCOBALAMIN) 500 MCG tablet Take 500 mcg by mouth daily.   Yes Historical Provider, MD  ipratropium (ATROVENT) 0.06 % nasal spray Place 2 sprays into both nostrils 4 (four) times daily. Patient not taking: Reported on 07/14/2015 10/31/14   Janne Napoleon, NP  metoprolol succinate (TOPROL-XL) 25 MG 24 hr tablet Take 1 tablet (25 mg total) by mouth 2 (two) times daily. Patient not taking: Reported on 07/14/2015 09/11/14   Herminio Commons, MD   BP 103/71 mmHg  Pulse 68  Temp(Src) 97.9 F (36.6 C) (Oral)  Resp 15  Ht 5\' 11"  (1.803 m)  Wt 105.779 kg  BMI 32.54 kg/m2  SpO2 97% Physical Exam  Constitutional: He is oriented to person, place, and time. He appears well-developed and well-nourished. No  distress.  HENT:  Head: Normocephalic and atraumatic.  Right Ear: External ear normal.  Left Ear: External ear normal.  Eyes: Pupils are equal, round, and reactive to light. Right eye exhibits no discharge. Left eye exhibits no discharge.  Neck: Normal range of motion. No JVD present. No tracheal deviation present.  Cardiovascular: Normal rate, regular rhythm and normal heart sounds.  Exam reveals no friction rub.   No murmur heard. Pulmonary/Chest: Effort normal and breath sounds normal. No stridor. No respiratory distress. He has no wheezes.  Abdominal: Soft. Bowel sounds are normal. He exhibits no distension. There is no rebound and no guarding.  Musculoskeletal: Normal range of motion. He exhibits no edema or tenderness.  Lymphadenopathy:    He has no cervical adenopathy.  Neurological: He is alert and oriented to person, place, and time. No cranial nerve deficit. Coordination normal.  Skin: Skin is warm and dry. No rash noted. No pallor.  Psychiatric: He has a normal mood and affect. His behavior  is normal. Judgment and thought content normal.  Nursing note and vitals reviewed.   ED Course  Procedures (including critical care time) Labs Review Labs Reviewed  BASIC METABOLIC PANEL - Abnormal; Notable for the following:    Glucose, Bld 147 (*)    All other components within normal limits  CBC  HEPARIN LEVEL (UNFRACTIONATED)  CBC  BASIC METABOLIC PANEL  LIPID PANEL  TROPONIN I  TROPONIN I  TROPONIN I  I-STAT TROPOININ, ED  Randolm Idol, ED    Imaging Review Dg Chest 2 View  07/14/2015  CLINICAL DATA:  65 year old male with chest and abdominal pain with weakness for 1 day. Initial encounter. EXAM: CHEST  2 VIEW COMPARISON:  Chest radiographs 05/20/2013. FINDINGS: Mildly lower lung volumes. Stable mild cardiomegaly. Other mediastinal contours are within normal limits. Visualized tracheal air column is within normal limits. Chronic sequelae of CABG and median sternotomy.  Mildly increased interstitial markings throughout both lungs with no pneumothorax, pleural effusion, or confluent pulmonary opacity. No acute osseous abnormality identified. Stable cholecystectomy clips. IMPRESSION: Mildly increased interstitial markings in both lungs since 2014 could reflect acute viral/atypical respiratory infection, mild or developing interstitial edema (no pleural fluid identified), or chronic interstitial lung changes. Electronically Signed   By: Genevie Ann M.D.   On: 07/14/2015 13:11   I have personally reviewed and evaluated these images and lab results as part of my medical decision-making.   EKG Interpretation   Date/Time:  Wednesday July 14 2015 12:44:16 EST Ventricular Rate:  72 PR Interval:  162 QRS Duration: 96 QT Interval:  410 QTC Calculation: 448 R Axis:   -25 Text Interpretation:  Normal sinus rhythm Moderate voltage criteria for  LVH, may be normal variant Borderline ECG Confirmed by Jeneen Rinks  MD, [place]  [phone]) on 07/14/2015 5:59:39 PM      MDM   Final diagnoses:  Chest pain, unspecified chest pain type    Patient with history of CAD status post several stents. Patient with last cardiac cath in 2015. Patient presents for evaluation of left-sided chest pain, generalized weakness and shortness of breath. This is been present for one week but is worsened today at work. Patient did not take aspirin nitroglycerin at home.  Upon arrival patient afebrile in no acute distress. The heart rate in the 60s to 70s with a normal blood pressure.  I gave patient 324 aspirin upon arrival.  Differential diagnoses includes ACS versus GERD versus musculoskeletal versus pneumothorax.  Chest x-ray unremarkable.  EKG normal sinus rhythm, rate 72, PR 162, QRS 96, QTC 448. No acute ischemic changes. No T-wave inversion.  Troponin negative, BMP, CBC unremarkable.  Discussed case with cardiology who will admit patient for possible cath in the morning. Cardiology requested  heparin bolus and infusion which I have ordered in the ED. Patient on nitro drip, got morphine in the ED.  Discussed case with attending Dr. Jeneen Rinks.   Vira Blanco, MD 07/14/15 [id number]  Tanna Furry, MD 07/15/15 [phone]

## 2015-07-14 NOTE — ED Notes (Signed)
Pt from home for eval of left sided cp that started while at work with radiation to left arm, pt also reports dizziness and weakness x1 weak. Pt with hx of bypass and hx of MI.

## 2015-07-14 NOTE — H&P (Signed)
History & Physical    Patient ID: Ryan Vaughn MRN: [id number], DOB/AGE: Jul 22, 1950   Admit date: 07/14/2015   Primary Physician: Curlene Labrum, MD Primary Cardiologist: Bronson Ing  Patient Profile    Chest pain  Past Medical History    Past Medical History  Diagnosis Date  . Essential hypertension, benign   . Mixed hyperlipidemia   . Personal history of colonic polyps   . Lumbago   . Kidney stones   . Arthritis   . Non Hodgkin's lymphoma ([place]) 2002    a. stage IV  . CAD (coronary artery disease)     a. DES to OM 1 and DES mLCX (12/05/13) b. CABG x1  ('92) c. 6 stents placed in 2009? (pt hx)     Past Surgical History  Procedure Laterality Date  . Coronary artery bypass graft  1992    "CABG X1"  . Coronary angioplasty with stent placement  ~ 1999; ~ 2009 X 2; 12/05/2013    "1 +3 + 2 + 2"  . Tonsillectomy    . Inguinal hernia repair    . Exploratory laparotomy with abdominal mass excision  08/2000    "looking for cancer"  . Cholecystectomy  08/2000  . Cystostomy w/ stent insertion    . Cystoscopy w/ ureteroscopy w/ lithotripsy    . Left heart catheterization with coronary angiogram N/A 12/05/2013    Procedure: LEFT HEART CATHETERIZATION WITH CORONARY ANGIOGRAM;  Surgeon: Blane Ohara, MD;  Location: [facility] CATH LAB;  Service: Cardiovascular;  Laterality: N/A;  . Percutaneous coronary stent intervention (pci-s)  12/05/2013    Procedure: PERCUTANEOUS CORONARY STENT INTERVENTION (PCI-S);  Surgeon: Blane Ohara, MD;  Location: [facility] CATH LAB;  Service: Cardiovascular;;  mid circumflex and prox OM2     Allergies  Allergies  Allergen Reactions  . Ivp Dye [Iodinated Diagnostic Agents] Other (See Comments)    headache    History of Present Illness    The patient is a 65 year old man with a history of hypertension, hyperlipidemia, coronary artery disease status post CABG as well as PCI, most recently to [place] and circumflex artery presenting with new onset chest pain.   The patient was in his usual state of health but has been complaining of increasing fatigue for the last month.  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  This pain was similar to his discomfort that he experienced in 2015 prior to his most recent cardiac catheterization.  He also complained of slight weakness, diaphoresis, and fatigue.  Given that his symptoms are so similar to prior he presented to the emergency room for additional evaluation and management.  In the emergency room, his heart rate was 64 and his blood pressure was 115/72.  He was having a mild amount of chest discomfort that resolved with morphine and nitroglycerin.  He is currently on a nitroglycerin infusion.  His EKG showed no acute ST-T wave changes concerning for acute ischemia.  His first set of cardiac biomarkers was negative.  Home Medications    Prior to Admission medications   Medication Sig Start Date End Date Taking? Authorizing Provider  aspirin 81 MG tablet Take 81 mg by mouth daily.   Yes Historical Provider, MD  clopidogrel (PLAVIX) 75 MG tablet Take 75 mg by mouth daily.   Yes Historical Provider, MD  cyclobenzaprine (FLEXERIL) 10 MG tablet Take 10 mg by mouth 3 (three) times daily as needed for muscle  spasms.   Yes Historical Provider, MD  metoprolol succinate (TOPROL-XL) 50 MG 24 hr tablet Take 50 mg by mouth 2 (two) times daily. Take with or immediately following a meal.   Yes Historical Provider, MD  Multiple Vitamin (MULTIVITAMIN) tablet Take 1 tablet by mouth daily.   Yes Historical Provider, MD  rosuvastatin (CRESTOR) 20 MG tablet Take 20 mg by mouth daily.   Yes Historical Provider, MD  vitamin B-12 (CYANOCOBALAMIN) 500 MCG tablet Take 500 mcg by mouth daily.   Yes Historical Provider, MD  ipratropium (ATROVENT) 0.06 % nasal spray Place 2 sprays into both nostrils 4 (four) times daily. Patient not taking: Reported on 07/14/2015 10/31/14    Janne Napoleon, NP  metoprolol succinate (TOPROL-XL) 25 MG 24 hr tablet Take 1 tablet (25 mg total) by mouth 2 (two) times daily. Patient not taking: Reported on 07/14/2015 09/11/14   Herminio Commons, MD    Family History    Family History  Problem Relation Age of Onset  . Breast cancer Mother   . Colon cancer Father     Social History    Social History   Social History  . Marital Status: Single    Spouse Name: N/A  . Number of Children: N/A  . Years of Education: N/A   Occupational History  . Not on file.   Social History Main Topics  . Smoking status: Never Smoker   . Smokeless tobacco: Current User    Types: Snuff     Comment: 12/05/2013 "use snuff q now and again"  . Alcohol Use: 0.0 oz/week    0 Standard drinks or equivalent per week     Comment: 12/05/2013 "maybe 2 cans beer/yr"  . Drug Use: No  . Sexual Activity: Yes   Other Topics Concern  . Not on file   Social History Narrative     Review of Systems    General:  No chills, fever, night sweats or weight changes.  Cardiovascular:  +chest pain, +dyspnea on exertion, -edema, -orthopnea, -palpitations, paroxysmal nocturnal dyspnea. Dermatological: No rash, lesions/masses Respiratory: No cough, dyspnea Urologic: No hematuria, dysuria Abdominal:   No nausea, vomiting, diarrhea, bright red blood per rectum, melena, or hematemesis Neurologic:  No visual changes, wkns, changes in mental status. All other systems reviewed and are otherwise negative except as noted above.  Physical Exam    Blood pressure 111/77, pulse 64, temperature 98 F (36.7 C), temperature source Oral, resp. rate 17, height 5\' 11"  (1.803 m), weight 230 lb (104.327 kg), SpO2 96 %.  General: Pleasant, NAD Psych: Normal affect. Neuro: Alert and oriented X 3. Moves all extremities spontaneously. HEENT: Normal  Neck: Supple without bruits or JVD. Lungs:  Resp regular and unlabored, CTA. Heart: RRR no s3, s4, or murmurs. Abdomen: Soft,  non-tender, non-distended, BS + x 4.  Extremities: No clubbing, cyanosis or edema. DP/PT/Radials 2+ and equal bilaterally.  Labs    Troponin [facility] of Care Test)  Recent Labs  07/14/15 1646  TROPIPOC 0.01   No results for input(s): CKTOTAL, CKMB, TROPONINI in the last 72 hours. Lab Results  Component Value Date   WBC 5.2 07/14/2015   HGB 14.7 07/14/2015   HCT 44.2 07/14/2015   MCV 90.0 07/14/2015   PLT 187 07/14/2015    Recent Labs Lab 07/14/15 1251  NA 137  K 3.8  CL 102  CO2 22  BUN 13  CREATININE 1.14  CALCIUM 9.5  GLUCOSE 147*   No results found for: CHOL, HDL,  LDLCALC, TRIG No results found for: [facility]   Radiology Studies    Dg Chest 2 View  07/14/2015  CLINICAL DATA:  65 year old male with chest and abdominal pain with weakness for 1 day. Initial encounter. EXAM: CHEST  2 VIEW COMPARISON:  Chest radiographs 05/20/2013. FINDINGS: Mildly lower lung volumes. Stable mild cardiomegaly. Other mediastinal contours are within normal limits. Visualized tracheal air column is within normal limits. Chronic sequelae of CABG and median sternotomy. Mildly increased interstitial markings throughout both lungs with no pneumothorax, pleural effusion, or confluent pulmonary opacity. No acute osseous abnormality identified. Stable cholecystectomy clips. IMPRESSION: Mildly increased interstitial markings in both lungs since 2014 could reflect acute viral/atypical respiratory infection, mild or developing interstitial edema (no pleural fluid identified), or chronic interstitial lung changes. Electronically Signed   By: Genevie Ann M.D.   On: 07/14/2015 13:11    ECG & Cardiac Imaging    EKG reveals no acute ST-TW changes concerning for acute ischemia. NSR @ 72bpm, LVH  Cath 12/05/2013 PROCEDURAL FINDINGS Hemodynamics: AO 106/65 LV 100/15  Coronary angiography: Coronary dominance: right  Left mainstem: The left mainstem is patent with minor irregularity. The distal left  mainstem has 20% stenosis.  Left anterior descending (LAD): The LAD is 100% occluded at its origin. The vessel fills from the mammary graft.  Left circumflex (LCx): The left circumflex is heavily stented. The first obtuse marginal has diffuse 90% stenosis. This appears to be diffuse in-stent restenosis. The AV circumflex beyond the first OM is heavily stented with approximately 3 stents noted. There is a 95% stenosis in the midcircumflex at the proximal edge of the distally stented area. There is a posterolateral branch without significant stenosis beyond the stented segment.  Right coronary artery (RCA): This is a dominant vessel. The RCA has diffuse plaquing without high-grade obstruction. The stented segment in the distal vessel is widely patent. The PDA branch is widely patent.  LIMA to LAD: Widely patent throughout. No obstructive disease. The vessel is tortuous. The distal anastomotic site is widely patent without significant stenosis. The septal perforators and diagonal branches are patent.  Left ventriculography: Left ventricular systolic function is normal, LVEF is estimated at 55-65%, there is no significant mitral regurgitation   PCI Note: Following the diagnostic procedure, the decision was made to proceed with PCI. The first obtuse marginal and mid circumflex areas both had severe stenoses. Attention was first turned to the midcircumflex. Additional unfractionated heparin was administered. Once a therapeutic ACT was achieved, a 6 [place] XB LAD 3.5 cm guide catheter was inserted. A cougar coronary guidewire was used to cross the lesion. The lesion was predilated with a 2.5 x 12 mm balloon. The lesion was then stented with a 2.75 x 20 mm Promus premier drug-eluting stent. The midportion of the stent was clearly underexpanded. The stent was postdilated with a 3.0 x 15 mm noncompliant balloon to 22 atmospheres. It still appeared underexpanded to a 3.0 x 6 mm Noncompliant balloon was  dilated to a maximum pressure of 24 atmospheres directly over the area of under expansion. I felt this was the best obtainable result as there was approximately 20% residual stenosis. Attention was then turned to the first obtuse marginal branch. That branch was wired with a second cougar wire. It was predilated with a 2.5 mm balloon to 8 atmospheres. The lesion was then stented with a 2.75 x 20 mm Promus premier DES deployed at 12 atmospheres. The stent was postdilated to 16 atmospheres with a 3.0 mm noncompliant  balloon. Final angiography confirmed an excellent result. The patient tolerated the procedure well. There were no immediate procedural complications. A TR band was used for radial hemostasis. The patient was transferred to the post catheterization recovery area for further monitoring.  PCI Data: Lesion 1 Vessel - LCx/Segment - mid (ISR) Percent Stenosis (pre) 95 TIMI-flow 3 Stent 2.75x20 mm Promus DES Percent Stenosis (post) 20 TIMI-flow (post) 3  Lesion 2 Vessel - OM1/Segment - proximal (ISR) Percent Stenosis (pre) 90 TIMI-flow 3 Stent 2.75x20 mm Promus DES Percent Stenosis (post) 0 TIMI-flow (post) 3  Final Conclusions:  1. 3 vessel coronary artery disease with total occlusion of the proximal LAD, continued patency of the stented segment in the distal RCA, and severe in-stent restenosis in the first OM branch and mid left circumflex  2. Status post single-vessel CABG with continued patency of the LIMA to LAD  3. Normal LV systolic function  4. Successful PCI of the first OM and mid circumflex using drug-eluting stent platforms.  Recommendations:  Continue long-term dual antiplatelet therapy with aspirin and Plavix, minimum of 12 months.   Assessment & Plan  The patient is a 65 year old man with a history of coronary disease, hypertension, and hyperlipidemia with substernal chest discomfort similar to prior anginal episodes.  At this point, I am most concerned about  unstable angina given that his cardiac biomarkers are negative thus far.  He is on a stable medical regimen that includes dual antiplatelet therapy.  He is currently chest pain-free and his vital signs are stable.  -Continue ASA/plavix -continue BB, rosuvastatin -TTE in AM -NPO p MN for stress testing versus cath. Given his history I favor cath but can consider noninvasive testing if his biomarkers continue to be negative  Signed, Raliegh Ip, MD MPH  07/14/2015, 10:02 PM

## 2015-07-14 NOTE — ED Notes (Signed)
Attempted to call report

## 2015-07-15 ENCOUNTER — Other Ambulatory Visit (HOSPITAL_COMMUNITY): Payer: Self-pay

## 2015-07-15 DIAGNOSIS — I10 Essential (primary) hypertension: Secondary | ICD-10-CM

## 2015-07-15 DIAGNOSIS — E785 Hyperlipidemia, unspecified: Secondary | ICD-10-CM | POA: Diagnosis not present

## 2015-07-15 DIAGNOSIS — I200 Unstable angina: Secondary | ICD-10-CM

## 2015-07-15 DIAGNOSIS — Z951 Presence of aortocoronary bypass graft: Secondary | ICD-10-CM | POA: Diagnosis not present

## 2015-07-15 LAB — BASIC METABOLIC PANEL
ANION GAP: 10 (ref 5–15)
BUN: 12 mg/dL (ref 6–20)
CHLORIDE: 103 mmol/L (ref 101–111)
CO2: 26 mmol/L (ref 22–32)
Calcium: 8.7 mg/dL — ABNORMAL LOW (ref 8.9–10.3)
Creatinine, Ser: 1.03 mg/dL (ref 0.61–1.24)
GFR calc non Af Amer: >60 mL/min (ref >60)
Glucose, Bld: 167 mg/dL — ABNORMAL HIGH (ref 65–99)
POTASSIUM: 3.8 mmol/L (ref 3.5–5.1)
Sodium: 139 mmol/L (ref 135–145)

## 2015-07-15 LAB — CBC
HCT: 42.1 % (ref 39.0–52.0)
HEMOGLOBIN: 13.8 g/dL (ref 13.0–17.0)
MCH: 29.6 pg (ref 26.0–34.0)
MCHC: 32.8 g/dL (ref 30.0–36.0)
MCV: 90.1 fL (ref 78.0–100.0)
Platelets: 164 10*3/uL (ref 150–400)
RBC: 4.67 MIL/uL (ref 4.22–5.81)
RDW: 13.3 % (ref 11.5–15.5)
WBC: 4.5 10*3/uL (ref 4.0–10.5)

## 2015-07-15 LAB — TROPONIN I
Troponin I: <0.03 ng/mL (ref <0.031)
Troponin I: <0.03 ng/mL (ref <0.031)
Troponin I: <0.03 ng/mL (ref <0.031)

## 2015-07-15 LAB — LIPID PANEL
CHOL/HDL RATIO: 5.3 ratio
CHOLESTEROL: 148 mg/dL (ref 0–200)
HDL: 28 mg/dL — ABNORMAL LOW (ref >40)
LDL Cholesterol: UNDETERMINED mg/dL (ref 0–99)
TRIGLYCERIDES: 473 mg/dL — AB (ref <150)
VLDL: UNDETERMINED mg/dL (ref 0–40)

## 2015-07-15 LAB — HEPARIN LEVEL (UNFRACTIONATED)
HEPARIN UNFRACTIONATED: 0.3 [IU]/mL (ref 0.30–0.70)
Heparin Unfractionated: 0.54 IU/mL (ref 0.30–0.70)

## 2015-07-15 MED ORDER — SODIUM CHLORIDE 0.9 % WEIGHT BASED INFUSION
3.0000 mL/kg/h | INTRAVENOUS | Status: DC
  Administered 2015-07-16: 3 mL/kg/h via INTRAVENOUS

## 2015-07-15 MED ORDER — DIPHENHYDRAMINE HCL 50 MG/ML IJ SOLN
25.0000 mg | INTRAMUSCULAR | Status: AC
Start: 2015-07-16 — End: 2015-07-16
  Administered 2015-07-16: 25 mg via INTRAVENOUS
  Filled 2015-07-15: qty 1

## 2015-07-15 MED ORDER — ASPIRIN 81 MG PO CHEW
81.0000 mg | CHEWABLE_TABLET | ORAL | Status: AC
  Administered 2015-07-16: 81 mg via ORAL
  Filled 2015-07-15: qty 1

## 2015-07-15 MED ORDER — SODIUM CHLORIDE 0.9% FLUSH: 3.0000 mL | Freq: Two times a day (BID) | INTRAVENOUS | Status: DC

## 2015-07-15 MED ORDER — SODIUM CHLORIDE 0.9% FLUSH
3.0000 mL | Freq: Two times a day (BID) | INTRAVENOUS | Status: DC
  Administered 2015-07-15 – 2015-07-16 (×2): 3 mL via INTRAVENOUS

## 2015-07-15 MED ORDER — SODIUM CHLORIDE 0.9 % IV SOLN: 250.0000 mL | INTRAVENOUS | Status: DC | PRN

## 2015-07-15 MED ORDER — PREDNISONE 20 MG PO TABS
60.0000 mg | ORAL_TABLET | ORAL | Status: AC
  Administered 2015-07-15: 60 mg via ORAL
  Filled 2015-07-15: qty 3

## 2015-07-15 MED ORDER — SODIUM CHLORIDE 0.9 % WEIGHT BASED INFUSION: 3.0000 mL/kg/h | INTRAVENOUS | Status: DC

## 2015-07-15 MED ORDER — ASPIRIN 81 MG PO CHEW: 81.0000 mg | CHEWABLE_TABLET | ORAL | Status: DC

## 2015-07-15 MED ORDER — PREDNISONE 20 MG PO TABS
60.0000 mg | ORAL_TABLET | ORAL | Status: AC
  Administered 2015-07-16: 60 mg via ORAL
  Filled 2015-07-15: qty 3

## 2015-07-15 MED ORDER — FAMOTIDINE IN NACL 20-0.9 MG/50ML-% IV SOLN
20.0000 mg | INTRAVENOUS | Status: AC
  Administered 2015-07-16: 20 mg via INTRAVENOUS
  Filled 2015-07-15 (×2): qty 50

## 2015-07-15 MED ORDER — SODIUM CHLORIDE 0.9 % WEIGHT BASED INFUSION: 1.0000 mL/kg/h | INTRAVENOUS | Status: DC

## 2015-07-15 MED ORDER — SODIUM CHLORIDE 0.9% FLUSH: 3.0000 mL | INTRAVENOUS | Status: DC | PRN

## 2015-07-15 MED ORDER — MORPHINE SULFATE (PF) 2 MG/ML IV SOLN
2.0000 mg | INTRAVENOUS | Status: DC | PRN
  Administered 2015-07-15 – 2015-07-16 (×2): 2 mg via INTRAVENOUS
  Filled 2015-07-15 (×2): qty 1

## 2015-07-15 MED ORDER — ASPIRIN EC 81 MG PO TBEC
81.0000 mg | DELAYED_RELEASE_TABLET | Freq: Every day | ORAL | Status: DC
  Filled 2015-07-15: qty 1

## 2015-07-15 MED ORDER — NITROGLYCERIN IN D5W 200-5 MCG/ML-% IV SOLN
5.0000 ug/min | INTRAVENOUS | Status: DC
  Administered 2015-07-15: 10 ug/min via INTRAVENOUS
  Administered 2015-07-15: 5 ug/min via INTRAVENOUS
  Administered 2015-07-15: 10 ug/min via INTRAVENOUS

## 2015-07-15 NOTE — Consult Note (Signed)
ANTICOAGULATION CONSULT NOTE - Follow-up Consult  Pharmacy Consult for Heparin Indication: chest pain/ACS  Allergies  Allergen Reactions  . Ivp Dye [Iodinated Diagnostic Agents] Other (See Comments)    headache    Patient Measurements: Height: 5\' 11"  (180.3 cm) Weight: 233 lb 3.2 oz (105.779 kg) IBW/kg (Calculated) : 75.3 Heparin Dosing Weight: 96.8kg  Vital Signs: Temp: 97.9 F (36.6 C) (02/22 2250) Temp Source: Oral (02/22 2250) BP: 103/75 mmHg (02/23 0118) Pulse Rate: 68 (02/22 2250)  Labs:  Recent Labs  07/14/15 1251 07/14/15 2300 07/15/15 0407  HGB 14.7  --  13.8  HCT 44.2  --  42.1  PLT 187  --  164  HEPARINUNFRC  --   --  0.30  CREATININE 1.14  --  1.03  TROPONINI  --  <0.03  --     Estimated Creatinine Clearance: 92 mL/min (by C-G formula based on Cr of 1.03).   Assessment: 64yom on heparin for r/o ACS. Trop negative. Heparin level 0.3 (low end of therapeutic) on 1350 units/hr. Plan for stress test vs cath today. No bleeding noted.   Goal of Therapy:  Heparin level 0.3-0.7 units/ml Monitor platelets by anticoagulation protocol: Yes   Plan:  Increase heparin drip to 1450 units/hr F/u 6 hour confirmatory level   Sherlon Handing, PharmD, BCPS Clinical pharmacist, pager [phone] 07/15/2015,4:46 AM

## 2015-07-15 NOTE — Plan of Care (Signed)
Problem: Safety: Goal: Ability to remain free from injury will improve Outcome: Completed/Met Date Met:  07/15/15 Patient is able to ambulate without difficulty. Patient's call bell is within reach, non skid socks are on, and path to the bathroom is clear.  Problem: Pain Managment: Goal: General experience of comfort will improve Outcome: Progressing Patient is having intermittent chest pain rated 3/10-4/10 which is being managed with a nitroglycerin drip. Patient is also having a slight headache which is being managed by Tylenol.

## 2015-07-15 NOTE — Consult Note (Signed)
ANTICOAGULATION CONSULT NOTE - Follow-up Consult  Pharmacy Consult for Heparin Indication: chest pain/ACS  Allergies  Allergen Reactions  . Ivp Dye [Iodinated Diagnostic Agents] Other (See Comments)    headache    Patient Measurements: Height: 5\' 11"  (180.3 cm) Weight: 231 lb (104.781 kg) IBW/kg (Calculated) : 75.3 Heparin Dosing Weight: 96.8kg  Vital Signs: Temp: 98.4 F (36.9 C) (02/23 0513) Temp Source: Oral (02/23 0513) BP: 96/73 mmHg (02/23 1141) Pulse Rate: 65 (02/23 0513)  Labs:  Recent Labs  07/14/15 1251 07/14/15 2300 07/15/15 0407 07/15/15 1020  HGB 14.7  --  13.8  --   HCT 44.2  --  42.1  --   PLT 187  --  164  --   HEPARINUNFRC  --   --  0.30 0.54  CREATININE 1.14  --  1.03  --   TROPONINI  --  <0.03 <0.03 <0.03    Estimated Creatinine Clearance: 91.6 mL/min (by C-G formula based on Cr of 1.03).   Assessment: 64yom on heparin for r/o ACS. Trop negative. Heparin level is at goal (HL= 0.54) on 1550 units/hr. Plans for stress test vs cath.   Goal of Therapy:  Heparin level 0.3-0.7 units/ml Monitor platelets by anticoagulation protocol: Yes   Plan:  -No heparin changes needed -Daily heparin level and CBC  Hildred Laser, Pharm D 07/15/2015 1:02 PM

## 2015-07-15 NOTE — Progress Notes (Signed)
Patient Name: Ryan Vaughn Date of Encounter: 07/15/2015  Active Problems:   Unstable angina ([place])   Length of Stay:   SUBJECTIVE  Mild chest pressure while on NTG drip.  CURRENT MEDS . aspirin EC  81 mg Oral Daily  . clopidogrel  75 mg Oral Daily  . metoprolol succinate  50 mg Oral BID  . rosuvastatin  20 mg Oral Daily   . heparin 1,450 Units/hr (07/15/15 0538)  . nitroGLYCERIN 10 mcg/min (07/15/15 1017)   OBJECTIVE  Filed Vitals:   07/15/15 0016 07/15/15 0118 07/15/15 0513 07/15/15 1141  BP: 106/71 103/75 96/62 96/73   Pulse:   65   Temp:   98.4 F (36.9 C)   TempSrc:   Oral   Resp:   20   Height:      Weight:   231 lb (104.781 kg)   SpO2:   98%     Intake/Output Summary (Last 24 hours) at 07/15/15 1211 Last data filed at 07/15/15 0700  Gross per 24 hour  Intake    240 ml  Output    675 ml  Net   -435 ml   Filed Weights   07/14/15 1241 07/14/15 2250 07/15/15 0513  Weight: 230 lb (104.327 kg) 233 lb 3.2 oz (105.779 kg) 231 lb (104.781 kg)   PHYSICAL EXAM  General: Pleasant, NAD. Neuro: Alert and oriented X 3. Moves all extremities spontaneously. Psych: Normal affect. HEENT:  Normal  Neck: Supple without bruits or JVD. Lungs:  Resp regular and unlabored, CTA. Heart: 3/6 systolic murmur, no s3, s4,  Abdomen: Soft, non-tender, non-distended, BS + x 4.  Extremities: No clubbing, cyanosis or edema. DP/PT/Radials 2+ and equal bilaterally.  Accessory Clinical Findings  CBC  Recent Labs  07/14/15 1251 07/15/15 0407  WBC 5.2 4.5  HGB 14.7 13.8  HCT 44.2 42.1  MCV 90.0 90.1  PLT 187 [id number]   Basic Metabolic Panel  Recent Labs  07/14/15 1251 07/15/15 0407  NA 137 139  K 3.8 3.8  CL 102 103  CO2 22 26  GLUCOSE 147* 167*  BUN 13 12  CREATININE 1.14 1.03  CALCIUM 9.5 8.7*    Recent Labs  07/14/15 2300 07/15/15 0407 07/15/15 1020  TROPONINI <0.03 <0.03 <0.03    Recent Labs  07/15/15 0407  CHOL 148  HDL 28*  LDLCALC UNABLE TO  CALCULATE IF TRIGLYCERIDE OVER 400 mg/dL  TRIG 473*  CHOLHDL 5.3   Radiology/Studies  Dg Chest 2 View  07/14/2015  CLINICAL DATA:  65 year old male with chest and abdominal pain with weakness for 1 day. Initial encounter. EXAM: CHEST  2 VIEW COMPARISON:  Chest radiographs 05/20/2013. FINDINGS: Mildly lower lung volumes. Stable mild cardiomegaly. Other mediastinal contours are within normal limits. Visualized tracheal air column is within normal limits. Chronic sequelae of CABG and median sternotomy. Mildly increased interstitial markings throughout both lungs with no pneumothorax, pleural effusion, or confluent pulmonary opacity. No acute osseous abnormality identified. Stable cholecystectomy clips. IMPRESSION: Mildly increased interstitial markings in both lungs since 2014 could reflect acute viral/atypical respiratory infection, mild or developing interstitial edema (no pleural fluid identified), or chronic interstitial lung changes. Electronically Signed   By: Genevie Ann M.D.   On: 07/14/2015 13:11   TELE:  ECG:  LHC: 12/05/2013 Cath 12/05/2013 PROCEDURAL FINDINGS Hemodynamics: AO 106/65 LV 100/15  Coronary angiography: Coronary dominance: right  Left mainstem: The left mainstem is patent with minor irregularity. The distal left mainstem has 20% stenosis.  Left anterior descending (  LAD): The LAD is 100% occluded at its origin. The vessel fills from the mammary graft.  Left circumflex (LCx): The left circumflex is heavily stented. The first obtuse marginal has diffuse 90% stenosis. This appears to be diffuse in-stent restenosis. The AV circumflex beyond the first OM is heavily stented with approximately 3 stents noted. There is a 95% stenosis in the midcircumflex at the proximal edge of the distally stented area. There is a posterolateral branch without significant stenosis beyond the stented segment.  Right coronary artery (RCA): This is a dominant vessel. The RCA has diffuse  plaquing without high-grade obstruction. The stented segment in the distal vessel is widely patent. The PDA branch is widely patent.  LIMA to LAD: Widely patent throughout. No obstructive disease. The vessel is tortuous. The distal anastomotic site is widely patent without significant stenosis. The septal perforators and diagonal branches are patent.  Left ventriculography: Left ventricular systolic function is normal, LVEF is estimated at 55-65%, there is no significant mitral regurgitation   PCI Note: Following the diagnostic procedure, the decision was made to proceed with PCI. The first obtuse marginal and mid circumflex areas both had severe stenoses. Attention was first turned to the midcircumflex. Additional unfractionated heparin was administered. Once a therapeutic ACT was achieved, a 6 [place] XB LAD 3.5 cm guide catheter was inserted. A cougar coronary guidewire was used to cross the lesion. The lesion was predilated with a 2.5 x 12 mm balloon. The lesion was then stented with a 2.75 x 20 mm Promus premier drug-eluting stent. The midportion of the stent was clearly underexpanded. The stent was postdilated with a 3.0 x 15 mm noncompliant balloon to 22 atmospheres. It still appeared underexpanded to a 3.0 x 6 mm Noncompliant balloon was dilated to a maximum pressure of 24 atmospheres directly over the area of under expansion. I felt this was the best obtainable result as there was approximately 20% residual stenosis. Attention was then turned to the first obtuse marginal branch. That branch was wired with a second cougar wire. It was predilated with a 2.5 mm balloon to 8 atmospheres. The lesion was then stented with a 2.75 x 20 mm Promus premier DES deployed at 12 atmospheres. The stent was postdilated to 16 atmospheres with a 3.0 mm noncompliant balloon. Final angiography confirmed an excellent result. The patient tolerated the procedure well. There were no immediate procedural complications. A  TR band was used for radial hemostasis. The patient was transferred to the post catheterization recovery area for further monitoring.  PCI Data: Lesion 1 Vessel - LCx/Segment - mid (ISR) Percent Stenosis (pre) 95 TIMI-flow 3 Stent 2.75x20 mm Promus DES Percent Stenosis (post) 20 TIMI-flow (post) 3  Lesion 2 Vessel - OM1/Segment - proximal (ISR) Percent Stenosis (pre) 90 TIMI-flow 3 Stent 2.75x20 mm Promus DES Percent Stenosis (post) 0 TIMI-flow (post) 3  Final Conclusions:  1. 3 vessel coronary artery disease with total occlusion of the proximal LAD, continued patency of the stented segment in the distal RCA, and severe in-stent restenosis in the first OM branch and mid left circumflex  2. Status post single-vessel CABG with continued patency of the LIMA to LAD  3. Normal LV systolic function  4. Successful PCI of the first OM and mid circumflex using drug-eluting stent platforms.  Recommendations:  Continue long-term dual antiplatelet therapy with aspirin and Plavix, minimum of 12 months.   ASSESSMENT AND PLAN  The patient is a 65 year old man with a history of coronary disease, hypertension, and hyperlipidemia  with substernal chest discomfort similar to prior anginal episodes.   1. Unstable angina- the cath lab is already booked for today, we will schedule for tomorrow - continue ASA, plavix, start heparin drip, NTG drip -continue BB, rosuvastatin -TTE pending  2. HTN - controlled  3. HLP - on rosuvastatin     Signed, Dorothy Spark MD, [facility] 07/15/2015

## 2015-07-16 ENCOUNTER — Encounter (HOSPITAL_COMMUNITY)
Admission: EM | Disposition: A | Discharge status: Home / Self Care | Payer: Self-pay | Source: Home / Self Care | Attending: Internal Medicine

## 2015-07-16 ENCOUNTER — Observation Stay (HOSPITAL_COMMUNITY): Payer: BLUE CROSS/BLUE SHIELD

## 2015-07-16 DIAGNOSIS — Z7902 Long term (current) use of antithrombotics/antiplatelets: Secondary | ICD-10-CM | POA: Diagnosis not present

## 2015-07-16 DIAGNOSIS — I25110 Atherosclerotic heart disease of native coronary artery with unstable angina pectoris: Secondary | ICD-10-CM | POA: Insufficient documentation

## 2015-07-16 DIAGNOSIS — M545 Low back pain: Secondary | ICD-10-CM | POA: Diagnosis present

## 2015-07-16 DIAGNOSIS — E782 Mixed hyperlipidemia: Secondary | ICD-10-CM | POA: Diagnosis present

## 2015-07-16 DIAGNOSIS — Y831 Surgical operation with implant of artificial internal device as the cause of abnormal reaction of the patient, or of later complication, without mention of misadventure at the time of the procedure: Secondary | ICD-10-CM | POA: Diagnosis present

## 2015-07-16 DIAGNOSIS — Z8572 Personal history of non-Hodgkin lymphomas: Secondary | ICD-10-CM | POA: Diagnosis not present

## 2015-07-16 DIAGNOSIS — R079 Chest pain, unspecified: Secondary | ICD-10-CM

## 2015-07-16 DIAGNOSIS — Z7982 Long term (current) use of aspirin: Secondary | ICD-10-CM | POA: Diagnosis not present

## 2015-07-16 DIAGNOSIS — T82855A Stenosis of coronary artery stent, initial encounter: Secondary | ICD-10-CM | POA: Diagnosis present

## 2015-07-16 DIAGNOSIS — I071 Rheumatic tricuspid insufficiency: Secondary | ICD-10-CM | POA: Diagnosis present

## 2015-07-16 DIAGNOSIS — I200 Unstable angina: Secondary | ICD-10-CM | POA: Diagnosis not present

## 2015-07-16 DIAGNOSIS — I119 Hypertensive heart disease without heart failure: Secondary | ICD-10-CM | POA: Diagnosis present

## 2015-07-16 DIAGNOSIS — Z951 Presence of aortocoronary bypass graft: Secondary | ICD-10-CM | POA: Diagnosis not present

## 2015-07-16 HISTORY — PX: CARDIAC CATHETERIZATION: SHX172

## 2015-07-16 LAB — CBC
HEMATOCRIT: 43.4 % (ref 39.0–52.0)
HEMOGLOBIN: 14.5 g/dL (ref 13.0–17.0)
MCH: 30.3 pg (ref 26.0–34.0)
MCHC: 33.4 g/dL (ref 30.0–36.0)
MCV: 90.8 fL (ref 78.0–100.0)
Platelets: 191 10*3/uL (ref 150–400)
RBC: 4.78 MIL/uL (ref 4.22–5.81)
RDW: 13.3 % (ref 11.5–15.5)
WBC: 5.9 10*3/uL (ref 4.0–10.5)

## 2015-07-16 LAB — POCT ACTIVATED CLOTTING TIME
ACTIVATED CLOTTING TIME: 312 s
Activated Clotting Time: 266 seconds

## 2015-07-16 LAB — PROTIME-INR
INR: 1.11 (ref 0.00–1.49)
PROTHROMBIN TIME: 14.5 s (ref 11.6–15.2)

## 2015-07-16 LAB — HEPARIN LEVEL (UNFRACTIONATED): Heparin Unfractionated: 0.61 IU/mL (ref 0.30–0.70)

## 2015-07-16 SURGERY — LEFT HEART CATH AND CORS/GRAFTS ANGIOGRAPHY

## 2015-07-16 MED ORDER — SODIUM CHLORIDE 0.9% FLUSH: 3.0000 mL | INTRAVENOUS | Status: DC | PRN

## 2015-07-16 MED ORDER — FENTANYL CITRATE (PF) 100 MCG/2ML IJ SOLN
INTRAMUSCULAR | Status: AC
  Filled 2015-07-16: qty 2

## 2015-07-16 MED ORDER — ONDANSETRON HCL 4 MG/2ML IJ SOLN: 4.0000 mg | Freq: Four times a day (QID) | INTRAMUSCULAR | Status: DC | PRN

## 2015-07-16 MED ORDER — CLOPIDOGREL BISULFATE 75 MG PO TABS: 75.0000 mg | ORAL_TABLET | Freq: Every day | ORAL | Status: DC

## 2015-07-16 MED ORDER — LIDOCAINE HCL (PF) 1 % IJ SOLN
INTRAMUSCULAR | Status: DC | PRN
  Administered 2015-07-16: 5 mL via SUBCUTANEOUS

## 2015-07-16 MED ORDER — HEPARIN SODIUM (PORCINE) 1000 UNIT/ML IJ SOLN
INTRAMUSCULAR | Status: AC
  Filled 2015-07-16: qty 1

## 2015-07-16 MED ORDER — SODIUM CHLORIDE 0.9 % IV SOLN: 250.0000 mL | INTRAVENOUS | Status: DC | PRN

## 2015-07-16 MED ORDER — MIDAZOLAM HCL 2 MG/2ML IJ SOLN
INTRAMUSCULAR | Status: AC
  Filled 2015-07-16: qty 2

## 2015-07-16 MED ORDER — LIDOCAINE HCL (PF) 1 % IJ SOLN
INTRAMUSCULAR | Status: AC
  Filled 2015-07-16: qty 30

## 2015-07-16 MED ORDER — ACETAMINOPHEN 325 MG PO TABS: 650.0000 mg | ORAL_TABLET | ORAL | Status: DC | PRN

## 2015-07-16 MED ORDER — IOHEXOL 350 MG/ML SOLN
INTRAVENOUS | Status: DC | PRN
  Administered 2015-07-16: 90 mL via INTRA_ARTERIAL

## 2015-07-16 MED ORDER — HEPARIN (PORCINE) IN NACL 2-0.9 UNIT/ML-% IJ SOLN
INTRAMUSCULAR | Status: AC
  Filled 2015-07-16: qty 500

## 2015-07-16 MED ORDER — FENTANYL CITRATE (PF) 100 MCG/2ML IJ SOLN
INTRAMUSCULAR | Status: DC | PRN
  Administered 2015-07-16: 25 ug via INTRAVENOUS

## 2015-07-16 MED ORDER — SODIUM CHLORIDE 0.9 % WEIGHT BASED INFUSION: 1.0000 mL/kg/h | INTRAVENOUS | Status: AC

## 2015-07-16 MED ORDER — HEPARIN SODIUM (PORCINE) 1000 UNIT/ML IJ SOLN
INTRAMUSCULAR | Status: DC | PRN
  Administered 2015-07-16: 3000 [IU] via INTRAVENOUS
  Administered 2015-07-16 (×2): 5000 [IU] via INTRAVENOUS

## 2015-07-16 MED ORDER — HEPARIN (PORCINE) IN NACL 2-0.9 UNIT/ML-% IJ SOLN
INTRAMUSCULAR | Status: DC | PRN
  Administered 2015-07-16: 10 mL via INTRA_ARTERIAL

## 2015-07-16 MED ORDER — VERAPAMIL HCL 2.5 MG/ML IV SOLN
INTRAVENOUS | Status: AC
  Filled 2015-07-16: qty 2

## 2015-07-16 MED ORDER — ASPIRIN 81 MG PO CHEW: 81.0000 mg | CHEWABLE_TABLET | Freq: Every day | ORAL | Status: DC

## 2015-07-16 MED ORDER — SODIUM CHLORIDE 0.9% FLUSH
3.0000 mL | Freq: Two times a day (BID) | INTRAVENOUS | Status: DC
  Administered 2015-07-16: 18:00:00 3 mL via INTRAVENOUS

## 2015-07-16 MED ORDER — MIDAZOLAM HCL 2 MG/2ML IJ SOLN
INTRAMUSCULAR | Status: DC | PRN
  Administered 2015-07-16: 2 mg via INTRAVENOUS

## 2015-07-16 SURGICAL SUPPLY — 17 items
BALLN EUPHORA RX 2.5X12 (BALLOONS) ×2
BALLN ~~LOC~~ EUPHORA RX 3.5X15 (BALLOONS) ×2
BALLOON EUPHORA RX 2.5X12 (BALLOONS) IMPLANT
BALLOON ~~LOC~~ EUPHORA RX 3.5X15 (BALLOONS) IMPLANT
CATH INFINITI 5FR MULTPACK ANG (CATHETERS) ×1 IMPLANT
DEVICE RAD COMP TR BAND LRG (VASCULAR PRODUCTS) ×1 IMPLANT
GLIDESHEATH SLEND SS 6F .021 (SHEATH) ×1 IMPLANT
GUIDE CATH RUNWAY 6FR FR4 (CATHETERS) ×1 IMPLANT
KIT ENCORE 26 ADVANTAGE (KITS) ×1 IMPLANT
KIT HEART LEFT (KITS) ×2 IMPLANT
PACK CARDIAC CATHETERIZATION (CUSTOM PROCEDURE TRAY) ×2 IMPLANT
STENT PROMUS PREM MR 2.75X38 (Permanent Stent) ×1 IMPLANT
STENT PROMUS PREM MR 3.0X20 (Permanent Stent) ×1 IMPLANT
TRANSDUCER W/STOPCOCK (MISCELLANEOUS) ×2 IMPLANT
VALVE GUARDIAN II ~~LOC~~ HEMO (MISCELLANEOUS) ×1 IMPLANT
WIRE ASAHI PROWATER 180CM (WIRE) ×1 IMPLANT
WIRE SAFE-T 1.5MM-J .035X260CM (WIRE) ×1 IMPLANT

## 2015-07-16 NOTE — Care Management Note (Signed)
Case Management Note  Patient Details  Name: AYUUB SCOMA MRN: [id number] Date of Birth: Mar 02, 1951  Subjective/Objective:    Patient is from home, pta indep , on  Plavix, no other needs.                Action/Plan:   Expected Discharge Date:                  Expected Discharge Plan:  Home/Self Care  In-House Referral:     Discharge planning Services  CM Consult  Post Acute Care Choice:    Choice offered to:     DME Arranged:    DME Agency:     HH Arranged:    [place] Agency:     Status of Service:  Completed, signed off  Medicare Important Message Given:    Date Medicare IM Given:    Medicare IM give by:    Date Additional Medicare IM Given:    Additional Medicare Important Message give by:     If discussed at [place] of Stay Meetings, dates discussed:    Additional Comments:  Zenon Mayo, RN 07/16/2015, 4:53 PM

## 2015-07-16 NOTE — Consult Note (Signed)
ANTICOAGULATION CONSULT NOTE - Follow-up Consult  Pharmacy Consult for Heparin Indication: chest pain/ACS  Allergies  Allergen Reactions  . Ivp Dye [Iodinated Diagnostic Agents] Other (See Comments)    headache    Patient Measurements: Height: 5\' 11"  (180.3 cm) Weight: 228 lb 8 oz (103.647 kg) IBW/kg (Calculated) : 75.3 Heparin Dosing Weight: 96.8kg  Vital Signs: Temp: 97.8 F (36.6 C) (02/24 0753) Temp Source: Oral (02/24 0753) BP: 113/77 mmHg (02/24 0753) Pulse Rate: 58 (02/24 0753)  Labs:  Recent Labs  07/14/15 1251 07/14/15 2300 07/15/15 0407 07/15/15 1020 07/16/15 0500 07/16/15 0910  HGB 14.7  --  13.8  --  14.5  --   HCT 44.2  --  42.1  --  43.4  --   PLT 187  --  164  --  191  --   LABPROT  --   --   --   --  14.5  --   INR  --   --   --   --  1.11  --   HEPARINUNFRC  --   --  0.30 0.54  --  0.61  CREATININE 1.14  --  1.03  --   --   --   TROPONINI  --  <0.03 <0.03 <0.03  --   --     Estimated Creatinine Clearance: 91.1 mL/min (by C-G formula based on Cr of 1.03).   Assessment: 65 yo M on heparin for r/o ACS. Trop negative. Heparin level is at goal (HL= 0.61) on 1450 units/hr. Plans for cath today.  Goal of Therapy:  Heparin level 0.3-0.7 units/ml Monitor platelets by anticoagulation protocol: Yes   Plan:  No heparin changes needed Daily heparin level and CBC Follow-up after cath  [facility], Pharm.D., BCPS Clinical Pharmacist Pager [phone] 07/16/2015 11:35 AM

## 2015-07-16 NOTE — Progress Notes (Signed)
Echocardiogram 2D Echocardiogram has been performed.  Quashaun, Klimczak 07/16/2015, 11:22 AM

## 2015-07-16 NOTE — Plan of Care (Signed)
Problem: Pain Managment: Goal: General experience of comfort will improve Outcome: Progressing Currently has pain controlled to 1/10 with NTG gtt. Plan for Cardiac Cath today.   Problem: Physical Regulation: Goal: Ability to maintain clinical measurements within normal limits will improve Outcome: Progressing VS stable. Goal: Will remain free from infection Outcome: Progressing No s/s infection

## 2015-07-16 NOTE — H&P (View-Only) (Signed)
Patient Name: Ryan Vaughn Date of Encounter: 07/16/2015  Active Problems:   Unstable angina ([place])   Length of Stay:   SUBJECTIVE  He still has a mild chest pressure while on NTG drip.  CURRENT MEDS . [START ON 07/17/2015] aspirin EC  81 mg Oral Daily  . clopidogrel  75 mg Oral Daily  . diphenhydrAMINE  25 mg Intravenous Pre-Cath  . famotidine (PEPCID) IV  20 mg Intravenous Pre-Cath  . metoprolol succinate  50 mg Oral BID  . predniSONE  60 mg Oral Pre-Cath  . rosuvastatin  20 mg Oral Daily  . sodium chloride flush  3 mL Intravenous Q12H   . sodium chloride 1 mL/kg/hr (07/16/15 0751)  . heparin 1,450 Units/hr (07/16/15 0602)  . nitroGLYCERIN 5 mcg/min (07/15/15 2029)   OBJECTIVE  Filed Vitals:   07/15/15 1435 07/15/15 1930 07/16/15 0440 07/16/15 0753  BP: 105/61 102/64 112/71 113/77  Pulse: 71 63 60 58  Temp: 98.1 F (36.7 C) 97.8 F (36.6 C) 98.4 F (36.9 C) 97.8 F (36.6 C)  TempSrc: Axillary Oral Oral Oral  Resp:  17 16 18   Height:      Weight:   228 lb 8 oz (103.647 kg)   SpO2: 97% 97% 96% 97%    Intake/Output Summary (Last 24 hours) at 07/16/15 1013 Last data filed at 07/16/15 0830  Gross per 24 hour  Intake 876.34 ml  Output   2200 ml  Net -1323.66 ml   Filed Weights   07/14/15 2250 07/15/15 0513 07/16/15 0440  Weight: 233 lb 3.2 oz (105.779 kg) 231 lb (104.781 kg) 228 lb 8 oz (103.647 kg)   PHYSICAL EXAM  General: Pleasant, NAD. Neuro: Alert and oriented X 3. Moves all extremities spontaneously. Psych: Normal affect. HEENT:  Normal  Neck: Supple without bruits or JVD. Lungs:  Resp regular and unlabored, CTA. Heart: 3/6 systolic murmur, no s3, s4,  Abdomen: Soft, non-tender, non-distended, BS + x 4.  Extremities: No clubbing, cyanosis or edema. DP/PT/Radials 2+ and equal bilaterally.  Accessory Clinical Findings  CBC  Recent Labs  07/15/15 0407 07/16/15 0500  WBC 4.5 5.9  HGB 13.8 14.5  HCT 42.1 43.4  MCV 90.1 90.8  PLT 164 99991111    Basic Metabolic Panel  Recent Labs  07/14/15 1251 07/15/15 0407  NA 137 139  K 3.8 3.8  CL 102 103  CO2 22 26  GLUCOSE 147* 167*  BUN 13 12  CREATININE 1.14 1.03  CALCIUM 9.5 8.7*    Recent Labs  07/14/15 2300 07/15/15 0407 07/15/15 1020  TROPONINI <0.03 <0.03 <0.03    Recent Labs  07/15/15 0407  CHOL 148  HDL 28*  LDLCALC UNABLE TO CALCULATE IF TRIGLYCERIDE OVER 400 mg/dL  TRIG 473*  CHOLHDL 5.3   Radiology/Studies  Dg Chest 2 View  07/14/2015  CLINICAL DATA:  65 year old male with chest and abdominal pain with weakness for 1 day. Initial encounter. EXAM: CHEST  2 VIEW COMPARISON:  Chest radiographs 05/20/2013. FINDINGS: Mildly lower lung volumes. Stable mild cardiomegaly. Other mediastinal contours are within normal limits. Visualized tracheal air column is within normal limits. Chronic sequelae of CABG and median sternotomy. Mildly increased interstitial markings throughout both lungs with no pneumothorax, pleural effusion, or confluent pulmonary opacity. No acute osseous abnormality identified. Stable cholecystectomy clips. IMPRESSION: Mildly increased interstitial markings in both lungs since 2014 could reflect acute viral/atypical respiratory infection, mild or developing interstitial edema (no pleural fluid identified), or chronic interstitial lung changes. Electronically  Signed   By: Genevie Ann M.D.   On: 07/14/2015 13:11   TELE:  ECG:  LHC: 12/05/2013 Cath 12/05/2013 PROCEDURAL FINDINGS Hemodynamics: AO 106/65 LV 100/15  Coronary angiography: Coronary dominance: right  Left mainstem: The left mainstem is patent with minor irregularity. The distal left mainstem has 20% stenosis.  Left anterior descending (LAD): The LAD is 100% occluded at its origin. The vessel fills from the mammary graft.  Left circumflex (LCx): The left circumflex is heavily stented. The first obtuse marginal has diffuse 90% stenosis. This appears to be diffuse in-stent  restenosis. The AV circumflex beyond the first OM is heavily stented with approximately 3 stents noted. There is a 95% stenosis in the midcircumflex at the proximal edge of the distally stented area. There is a posterolateral branch without significant stenosis beyond the stented segment.  Right coronary artery (RCA): This is a dominant vessel. The RCA has diffuse plaquing without high-grade obstruction. The stented segment in the distal vessel is widely patent. The PDA branch is widely patent.  LIMA to LAD: Widely patent throughout. No obstructive disease. The vessel is tortuous. The distal anastomotic site is widely patent without significant stenosis. The septal perforators and diagonal branches are patent.  Left ventriculography: Left ventricular systolic function is normal, LVEF is estimated at 55-65%, there is no significant mitral regurgitation   PCI Note: Following the diagnostic procedure, the decision was made to proceed with PCI. The first obtuse marginal and mid circumflex areas both had severe stenoses. Attention was first turned to the midcircumflex. Additional unfractionated heparin was administered. Once a therapeutic ACT was achieved, a 6 [place] XB LAD 3.5 cm guide catheter was inserted. A cougar coronary guidewire was used to cross the lesion. The lesion was predilated with a 2.5 x 12 mm balloon. The lesion was then stented with a 2.75 x 20 mm Promus premier drug-eluting stent. The midportion of the stent was clearly underexpanded. The stent was postdilated with a 3.0 x 15 mm noncompliant balloon to 22 atmospheres. It still appeared underexpanded to a 3.0 x 6 mm Noncompliant balloon was dilated to a maximum pressure of 24 atmospheres directly over the area of under expansion. I felt this was the best obtainable result as there was approximately 20% residual stenosis. Attention was then turned to the first obtuse marginal branch. That branch was wired with a second cougar wire. It was  predilated with a 2.5 mm balloon to 8 atmospheres. The lesion was then stented with a 2.75 x 20 mm Promus premier DES deployed at 12 atmospheres. The stent was postdilated to 16 atmospheres with a 3.0 mm noncompliant balloon. Final angiography confirmed an excellent result. The patient tolerated the procedure well. There were no immediate procedural complications. A TR band was used for radial hemostasis. The patient was transferred to the post catheterization recovery area for further monitoring.  PCI Data: Lesion 1 Vessel - LCx/Segment - mid (ISR) Percent Stenosis (pre) 95 TIMI-flow 3 Stent 2.75x20 mm Promus DES Percent Stenosis (post) 20 TIMI-flow (post) 3  Lesion 2 Vessel - OM1/Segment - proximal (ISR) Percent Stenosis (pre) 90 TIMI-flow 3 Stent 2.75x20 mm Promus DES Percent Stenosis (post) 0 TIMI-flow (post) 3  Final Conclusions:  1. 3 vessel coronary artery disease with total occlusion of the proximal LAD, continued patency of the stented segment in the distal RCA, and severe in-stent restenosis in the first OM branch and mid left circumflex  2. Status post single-vessel CABG with continued patency of the LIMA to LAD  3. Normal LV systolic function  4. Successful PCI of the first OM and mid circumflex using drug-eluting stent platforms.  Recommendations:  Continue long-term dual antiplatelet therapy with aspirin and Plavix, minimum of 12 months.   ASSESSMENT AND PLAN  The patient is a 65 year old man with a history of coronary disease, hypertension, and hyperlipidemia with substernal chest discomfort similar to prior anginal episodes.   1. Unstable angina- for LHC today - continue ASA, plavix, start heparin drip, NTG drip -continue BB, rosuvastatin -TTE pending  2. HTN - controlled  3. HLP - on rosuvastatin     Signed, Dorothy Spark MD, [facility] 07/16/2015

## 2015-07-16 NOTE — Interval H&P Note (Signed)
Cath Lab Visit (complete for each Cath Lab visit)  Clinical Evaluation Leading to the Procedure:   ACS: Yes.    Non-ACS:    Anginal Classification: CCS IV  Anti-ischemic medical therapy: Minimal Therapy (1 class of medications)  Non-Invasive Test Results: No non-invasive testing performed  Prior CABG: No previous CABG      History and Physical Interval Note:  07/16/2015 1:37 PM  Ryan Vaughn  has presented today for surgery, with the diagnosis of cp  The various methods of treatment have been discussed with the patient and family. After consideration of risks, benefits and other options for treatment, the patient has consented to  Procedure(s): Left Heart Cath and Cors/Grafts Angiography (N/A) as a surgical intervention .  The patient's history has been reviewed, patient examined, no change in status, stable for surgery.  I have reviewed the patient's chart and labs.  Questions were answered to the patient's satisfaction.     Shirell Struthers S.

## 2015-07-16 NOTE — Progress Notes (Signed)
Pt states he's had burning discomfort in mid anterior chest ever since MD inflated balloon in procedure, states MD said "it was going to hurt."  Rates pain 4-5 on 0-10 scale. Has been up to bathroom, states pain no different with activity.  Pt did not want RN to increase NTG gtt because it makes his head hurt.  NTG infusing at 5 mcg/hr = 1.5 cc/hr.  BP 111/68.  2 mg Morphine given slow IVP.  Bed exit alarm activated, reminded pt not to get up w/o assist, pt voiced understanding, call light in reach.  Left radial TR band level 0.

## 2015-07-16 NOTE — Progress Notes (Signed)
TR BAND REMOVAL  LOCATION:    left radial  DEFLATED PER PROTOCOL:    Yes.    TIME BAND OFF / DRESSING APPLIED:    1910   SITE UPON ARRIVAL:    Level 0  SITE AFTER BAND REMOVAL:    Level 0  CIRCULATION SENSATION AND MOVEMENT:    Within Normal Limits   Yes.    COMMENTS:   Tolerated procedure well,

## 2015-07-16 NOTE — Progress Notes (Signed)
Heparin gtt d/c'd on call to the cath lab. Pepcid and Benadryl premeds given. Transport to [employer] via pt bed with NTG gtt at 5 mcg/min.

## 2015-07-16 NOTE — Progress Notes (Signed)
Patient Name: Ryan Vaughn Date of Encounter: 07/16/2015  Active Problems:   Unstable angina ([place])   Length of Stay:   SUBJECTIVE  He still has a mild chest pressure while on NTG drip.  CURRENT MEDS . [START ON 07/17/2015] aspirin EC  81 mg Oral Daily  . clopidogrel  75 mg Oral Daily  . diphenhydrAMINE  25 mg Intravenous Pre-Cath  . famotidine (PEPCID) IV  20 mg Intravenous Pre-Cath  . metoprolol succinate  50 mg Oral BID  . predniSONE  60 mg Oral Pre-Cath  . rosuvastatin  20 mg Oral Daily  . sodium chloride flush  3 mL Intravenous Q12H   . sodium chloride 1 mL/kg/hr (07/16/15 0751)  . heparin 1,450 Units/hr (07/16/15 0602)  . nitroGLYCERIN 5 mcg/min (07/15/15 2029)   OBJECTIVE  Filed Vitals:   07/15/15 1435 07/15/15 1930 07/16/15 0440 07/16/15 0753  BP: 105/61 102/64 112/71 113/77  Pulse: 71 63 60 58  Temp: 98.1 F (36.7 C) 97.8 F (36.6 C) 98.4 F (36.9 C) 97.8 F (36.6 C)  TempSrc: Axillary Oral Oral Oral  Resp:  17 16 18   Height:      Weight:   228 lb 8 oz (103.647 kg)   SpO2: 97% 97% 96% 97%    Intake/Output Summary (Last 24 hours) at 07/16/15 1013 Last data filed at 07/16/15 0830  Gross per 24 hour  Intake 876.34 ml  Output   2200 ml  Net -1323.66 ml   Filed Weights   07/14/15 2250 07/15/15 0513 07/16/15 0440  Weight: 233 lb 3.2 oz (105.779 kg) 231 lb (104.781 kg) 228 lb 8 oz (103.647 kg)   PHYSICAL EXAM  General: Pleasant, NAD. Neuro: Alert and oriented X 3. Moves all extremities spontaneously. Psych: Normal affect. HEENT:  Normal  Neck: Supple without bruits or JVD. Lungs:  Resp regular and unlabored, CTA. Heart: 3/6 systolic murmur, no s3, s4,  Abdomen: Soft, non-tender, non-distended, BS + x 4.  Extremities: No clubbing, cyanosis or edema. DP/PT/Radials 2+ and equal bilaterally.  Accessory Clinical Findings  CBC  Recent Labs  07/15/15 0407 07/16/15 0500  WBC 4.5 5.9  HGB 13.8 14.5  HCT 42.1 43.4  MCV 90.1 90.8  PLT 164 99991111    Basic Metabolic Panel  Recent Labs  07/14/15 1251 07/15/15 0407  NA 137 139  K 3.8 3.8  CL 102 103  CO2 22 26  GLUCOSE 147* 167*  BUN 13 12  CREATININE 1.14 1.03  CALCIUM 9.5 8.7*    Recent Labs  07/14/15 2300 07/15/15 0407 07/15/15 1020  TROPONINI <0.03 <0.03 <0.03    Recent Labs  07/15/15 0407  CHOL 148  HDL 28*  LDLCALC UNABLE TO CALCULATE IF TRIGLYCERIDE OVER 400 mg/dL  TRIG 473*  CHOLHDL 5.3   Radiology/Studies  Dg Chest 2 View  07/14/2015  CLINICAL DATA:  65 year old male with chest and abdominal pain with weakness for 1 day. Initial encounter. EXAM: CHEST  2 VIEW COMPARISON:  Chest radiographs 05/20/2013. FINDINGS: Mildly lower lung volumes. Stable mild cardiomegaly. Other mediastinal contours are within normal limits. Visualized tracheal air column is within normal limits. Chronic sequelae of CABG and median sternotomy. Mildly increased interstitial markings throughout both lungs with no pneumothorax, pleural effusion, or confluent pulmonary opacity. No acute osseous abnormality identified. Stable cholecystectomy clips. IMPRESSION: Mildly increased interstitial markings in both lungs since 2014 could reflect acute viral/atypical respiratory infection, mild or developing interstitial edema (no pleural fluid identified), or chronic interstitial lung changes. Electronically  Signed   By: Genevie Ann M.D.   On: 07/14/2015 13:11   TELE:  ECG:  LHC: 12/05/2013 Cath 12/05/2013 PROCEDURAL FINDINGS Hemodynamics: AO 106/65 LV 100/15  Coronary angiography: Coronary dominance: right  Left mainstem: The left mainstem is patent with minor irregularity. The distal left mainstem has 20% stenosis.  Left anterior descending (LAD): The LAD is 100% occluded at its origin. The vessel fills from the mammary graft.  Left circumflex (LCx): The left circumflex is heavily stented. The first obtuse marginal has diffuse 90% stenosis. This appears to be diffuse in-stent  restenosis. The AV circumflex beyond the first OM is heavily stented with approximately 3 stents noted. There is a 95% stenosis in the midcircumflex at the proximal edge of the distally stented area. There is a posterolateral branch without significant stenosis beyond the stented segment.  Right coronary artery (RCA): This is a dominant vessel. The RCA has diffuse plaquing without high-grade obstruction. The stented segment in the distal vessel is widely patent. The PDA branch is widely patent.  LIMA to LAD: Widely patent throughout. No obstructive disease. The vessel is tortuous. The distal anastomotic site is widely patent without significant stenosis. The septal perforators and diagonal branches are patent.  Left ventriculography: Left ventricular systolic function is normal, LVEF is estimated at 55-65%, there is no significant mitral regurgitation   PCI Note: Following the diagnostic procedure, the decision was made to proceed with PCI. The first obtuse marginal and mid circumflex areas both had severe stenoses. Attention was first turned to the midcircumflex. Additional unfractionated heparin was administered. Once a therapeutic ACT was achieved, a 6 [place] XB LAD 3.5 cm guide catheter was inserted. A cougar coronary guidewire was used to cross the lesion. The lesion was predilated with a 2.5 x 12 mm balloon. The lesion was then stented with a 2.75 x 20 mm Promus premier drug-eluting stent. The midportion of the stent was clearly underexpanded. The stent was postdilated with a 3.0 x 15 mm noncompliant balloon to 22 atmospheres. It still appeared underexpanded to a 3.0 x 6 mm Noncompliant balloon was dilated to a maximum pressure of 24 atmospheres directly over the area of under expansion. I felt this was the best obtainable result as there was approximately 20% residual stenosis. Attention was then turned to the first obtuse marginal branch. That branch was wired with a second cougar wire. It was  predilated with a 2.5 mm balloon to 8 atmospheres. The lesion was then stented with a 2.75 x 20 mm Promus premier DES deployed at 12 atmospheres. The stent was postdilated to 16 atmospheres with a 3.0 mm noncompliant balloon. Final angiography confirmed an excellent result. The patient tolerated the procedure well. There were no immediate procedural complications. A TR band was used for radial hemostasis. The patient was transferred to the post catheterization recovery area for further monitoring.  PCI Data: Lesion 1 Vessel - LCx/Segment - mid (ISR) Percent Stenosis (pre) 95 TIMI-flow 3 Stent 2.75x20 mm Promus DES Percent Stenosis (post) 20 TIMI-flow (post) 3  Lesion 2 Vessel - OM1/Segment - proximal (ISR) Percent Stenosis (pre) 90 TIMI-flow 3 Stent 2.75x20 mm Promus DES Percent Stenosis (post) 0 TIMI-flow (post) 3  Final Conclusions:  1. 3 vessel coronary artery disease with total occlusion of the proximal LAD, continued patency of the stented segment in the distal RCA, and severe in-stent restenosis in the first OM branch and mid left circumflex  2. Status post single-vessel CABG with continued patency of the LIMA to LAD  3. Normal LV systolic function  4. Successful PCI of the first OM and mid circumflex using drug-eluting stent platforms.  Recommendations:  Continue long-term dual antiplatelet therapy with aspirin and Plavix, minimum of 12 months.   ASSESSMENT AND PLAN  The patient is a 65 year old man with a history of coronary disease, hypertension, and hyperlipidemia with substernal chest discomfort similar to prior anginal episodes.   1. Unstable angina- for LHC today - continue ASA, plavix, start heparin drip, NTG drip -continue BB, rosuvastatin -TTE pending  2. HTN - controlled  3. HLP - on rosuvastatin     Signed, Dorothy Spark MD, [facility] 07/16/2015

## 2015-07-17 LAB — BASIC METABOLIC PANEL
ANION GAP: 4 — AB (ref 5–15)
BUN: 12 mg/dL (ref 6–20)
CO2: 26 mmol/L (ref 22–32)
Calcium: 8.9 mg/dL (ref 8.9–10.3)
Chloride: 108 mmol/L (ref 101–111)
Creatinine, Ser: 0.99 mg/dL (ref 0.61–1.24)
GFR calc Af Amer: >60 mL/min (ref >60)
GLUCOSE: 142 mg/dL — AB (ref 65–99)
POTASSIUM: 3.8 mmol/L (ref 3.5–5.1)
Sodium: 138 mmol/L (ref 135–145)

## 2015-07-17 LAB — CBC
HEMATOCRIT: 43 % (ref 39.0–52.0)
HEMOGLOBIN: 14.5 g/dL (ref 13.0–17.0)
MCH: 30.3 pg (ref 26.0–34.0)
MCHC: 33.7 g/dL (ref 30.0–36.0)
MCV: 90 fL (ref 78.0–100.0)
PLATELETS: 185 10*3/uL (ref 150–400)
RBC: 4.78 MIL/uL (ref 4.22–5.81)
RDW: 13.5 % (ref 11.5–15.5)
WBC: 11.5 10*3/uL — AB (ref 4.0–10.5)

## 2015-07-17 MED ORDER — ANGIOPLASTY BOOK
Freq: Once | Status: AC
  Administered 2015-07-17: 02:00:00
  Filled 2015-07-17: qty 1

## 2015-07-17 MED ORDER — METOPROLOL SUCCINATE ER 50 MG PO TB24: 25.0000 mg | ORAL_TABLET | Freq: Two times a day (BID) | ORAL | Status: DC

## 2015-07-17 MED ORDER — NITROGLYCERIN 0.4 MG SL SUBL: 0.4000 mg | SUBLINGUAL_TABLET | SUBLINGUAL | Status: DC | PRN

## 2015-07-17 NOTE — Discharge Summary (Signed)
Discharge Summary    Patient ID: Ryan Vaughn,  MRN: [id number], DOB/AGE: 1950-06-07 65 y.o.  Admit date: 07/14/2015 Discharge date: 07/17/2015  Primary Care Provider: Curlene Labrum Primary Cardiologist: Jacinta Shoe  Discharge Diagnoses    Active Problems:   HTN (hypertension)   Hyperlipidemia   Unstable angina [facility])   Coronary artery disease involving native coronary artery of native heart with unstable angina pectoris (HCC)   Allergies Allergies  Allergen Reactions  . Ivp Dye [Iodinated Diagnostic Agents] Other (See Comments)    headache    Diagnostic Studies/Procedures    Procedures    Coronary Stent Intervention   Left Heart Cath and Cors/Grafts Angiography    Conclusion     Patent stents in the circumflex and obtuse marginal.  Ost LAD to Prox LAD lesion, 100% stenosed. The LIMA to LAD is patent.  Mid RCA lesion, 75% stenosed, moderate disease extending into the proximal.. Post intervention with overlapping drug-eluting stents, postdilated to 3.5 mm, there is a 0% residual stenosis.  Normal LVEDP. Since the patient had an echocardiogram, we did not do a ventriculogram.  Continue dual antiplatelet therapy for at least a year and likely will continue clopidogrel beyond. Continue aggressive secondary prevention.    Diagnostic Diagram                                                     Post-Intervention Diagram                 Echocardiogram Study Conclusions  - Left ventricle: The cavity size was normal. Systolic function was normal. The estimated ejection fraction was in the range of 55% to 60%. Wall motion was normal; there were no regional wall motion abnormalities. - Mitral valve: There was trivial regurgitation. - Tricuspid valve: There was mild-moderate regurgitation. - Pulmonic valve: There was trivial regurgitation.  _____________   History of Present Illness     The patient is a 65 year old man with a history of  hypertension, hyperlipidemia, coronary artery disease status post CABG as well as PCI, most recently to [employer] and circumflex artery presenting with new onset chest pain. The patient was in his usual state of health but has been complaining of increasing fatigue for the last month. 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 This pain was similar to his discomfort that he experienced in 2015 prior to his most recent cardiac catheterization. He also complained of slight weakness, diaphoresis, and fatigue. Given that his symptoms are so similar to prior he presented to the emergency room for additional evaluation and management.  In the emergency room, his heart rate was 64 and his blood pressure was 115/72. He was having a mild amount of chest discomfort that resolved with morphine and nitroglycerin. He is currently on a nitroglycerin infusion. His EKG showed no acute ST-T wave changes concerning for acute ischemia. His first set of cardiac biomarkers was negative.  Hospital Course     Consultants: Cardiac rehabilitation  Patient was admitted and continued on aspirin Plavix beta blocker and Crestor. We started heparin drip and nitroglycerin drip. An echocardiogram before his heart catheterization this revealed an ejection fraction of 55-60% normal wall motion. Trivial MR mild to moderate tricuspid valve regurgitation and trivial pulmonic valve regurgitation.  His heart  cath was completed on 07/16/2015 and revealed patent stents in the circumflex and obtuse marginal.  Ost LAD to Prox LAD lesion, 100% stenosed. The LIMA to LAD is patent. Mid RCA lesion, 75% stenosed, moderate disease extending into the proximal. He underwent successful intervention with overlapping drug-eluting stents, postdilated to 3.5 mm, there is a 0% residual stenosis. Normal LVEDP.  Blood pressure is controlled.  Will continue 25 mg twice daily of metoprolol  which is what he was taking at home. 50 mg was held the night before discharge due to lower blood pressure and heart rate.  The patient was seen by Dr. Curt Vaughn who felt he was stable for DC home.     _____________  Discharge Vitals Blood pressure 115/82, pulse 62, temperature 98.3 F (36.8 C), temperature source Oral, resp. rate 20, height 5\' 11"  (1.803 m), weight 226 lb 13.7 oz (102.9 kg), SpO2 95 %.  Filed Weights   07/15/15 0513 07/16/15 0440 07/17/15 0357  Weight: 231 lb (104.781 kg) 228 lb 8 oz (103.647 kg) 226 lb 13.7 oz (102.9 kg)    Labs & Radiologic Studies     CBC  Recent Labs  07/16/15 0500 07/17/15 0519  WBC 5.9 11.5*  HGB 14.5 14.5  HCT 43.4 43.0  MCV 90.8 90.0  PLT 191 [id number]   Basic Metabolic Panel  Recent Labs  07/15/15 0407 07/17/15 0519  NA 139 138  K 3.8 3.8  CL 103 108  CO2 26 26  GLUCOSE 167* 142*  BUN 12 12  CREATININE 1.03 0.99  CALCIUM 8.7* 8.9   Liver Function Tests No results for input(s): AST, ALT, ALKPHOS, BILITOT, PROT, ALBUMIN in the last 72 hours. No results for input(s): LIPASE, AMYLASE in the last 72 hours. Cardiac Enzymes  Recent Labs  07/14/15 2300 07/15/15 0407 07/15/15 1020  TROPONINI <0.03 <0.03 <0.03     Recent Labs  07/15/15 0407  CHOL 148  HDL 28*  LDLCALC UNABLE TO CALCULATE IF TRIGLYCERIDE OVER 400 mg/dL  TRIG 473*  CHOLHDL 5.3   Thyroid Function Tests No results for input(s): TSH, T4TOTAL, T3FREE, THYROIDAB in the last 72 hours.  Invalid input(s): FREET3  Dg Chest 2 View  07/14/2015  CLINICAL DATA:  65 year old male with chest and abdominal pain with weakness for 1 day. Initial encounter. EXAM: CHEST  2 VIEW COMPARISON:  Chest radiographs 05/20/2013. FINDINGS: Mildly lower lung volumes. Stable mild cardiomegaly. Other mediastinal contours are within normal limits. Visualized tracheal air column is within normal limits. Chronic sequelae of CABG and median sternotomy. Mildly increased interstitial markings  throughout both lungs with no pneumothorax, pleural effusion, or confluent pulmonary opacity. No acute osseous abnormality identified. Stable cholecystectomy clips. IMPRESSION: Mildly increased interstitial markings in both lungs since 2014 could reflect acute viral/atypical respiratory infection, mild or developing interstitial edema (no pleural fluid identified), or chronic interstitial lung changes. Electronically Signed   By: Genevie Ann M.D.   On: 07/14/2015 13:11    Disposition   Pt is being discharged home today in good condition.  Follow-up Plans & Appointments    Follow-up Information    Follow up with Ryan Commons, MD.   Specialty:  Cardiology   Why:  The office will call you with your follow up appointment date and time   Contact information:   [place] [address] [phone]      Discharge Instructions    Amb Referral to Cardiac Rehabilitation    Complete by:  As directed  Diagnosis:  PCI     Diet - low sodium heart healthy    Complete by:  As directed      Discharge instructions    Complete by:  As directed   No lifting with right arm for 3 days     Increase activity slowly    Complete by:  As directed            Discharge Medications   Current Discharge Medication List    START taking these medications   Details  nitroGLYCERIN (NITROSTAT) 0.4 MG SL tablet Place 1 tablet (0.4 mg total) under the tongue every 5 (five) minutes x 3 doses as needed for chest pain. Qty: 25 tablet, Refills: 12      CONTINUE these medications which have CHANGED   Details  metoprolol succinate (TOPROL-XL) 50 MG 24 hr tablet Take 1 tablet (50 mg total) by mouth 2 (two) times daily. Take with or immediately following a meal.      CONTINUE these medications which have NOT CHANGED   Details  aspirin 81 MG tablet Take 81 mg by mouth daily.    clopidogrel (PLAVIX) 75 MG tablet Take 75 mg by mouth daily.    cyclobenzaprine (FLEXERIL) 10 MG tablet Take  10 mg by mouth 3 (three) times daily as needed for muscle spasms.    Multiple Vitamin (MULTIVITAMIN) tablet Take 1 tablet by mouth daily.    rosuvastatin (CRESTOR) 20 MG tablet Take 20 mg by mouth daily.    vitamin B-12 (CYANOCOBALAMIN) 500 MCG tablet Take 500 mcg by mouth daily.      STOP taking these medications     ipratropium (ATROVENT) 0.06 % nasal spray          Aspirin prescribed at discharge?  Yes High Intensity Statin Prescribed? (Lipitor 40-80mg  or Crestor 20-40mg ): Yes Beta Blocker Prescribed? Yes For EF 45% or less, Was ACEI/ARB Prescribed? NA ADP Receptor Inhibitor Prescribed? (i.e. Plavix etc.-Includes Medically Managed Patients): Yes For EF <40%, Aldosterone Inhibitor Prescribed? NA Was EF assessed during THIS hospitalization? Yes Was Cardiac Rehab II ordered? (Included Medically managed Patients): Yes   Outstanding Labs/Studies     Duration of Discharge Encounter   Greater than 30 minutes including physician time.  Otilio Connors [facility] 07/17/2015, 8:45 AM    I have seen and examined this patient with Luisa Dago.  Agree with above, note added to reflect my findings.  On exam, regular rhythm, no murmurs, lungs clear.  Had cardiac cath with stenting of the RCA.  Had TTE showing normal LVEF.  Plan to discharge today on prior home meds including aspirin and plavix as DAPT.  Will M. Camnitz MD 07/17/2015 9:25 AM

## 2015-07-17 NOTE — Progress Notes (Signed)
PHASE I Cardiac Rehab  Ambulation deferred, pt ambulating on his own.  Pt education complete including risk factors, diet, medication compliance and exercise. Pt has previously participated in [place] however he is encouraged to participate again.  Referral will be sent to [place].  Pt verbalized understanding.

## 2015-07-17 NOTE — Progress Notes (Signed)
SUBJECTIVE: The patient is doing well today.  At this time, he denies chest pain, shortness of breath, or any new concerns.  Marland Kitchen aspirin EC  81 mg Oral Daily  . clopidogrel  75 mg Oral Daily  . metoprolol succinate  50 mg Oral BID  . rosuvastatin  20 mg Oral Daily  . sodium chloride flush  3 mL Intravenous Q12H   . nitroGLYCERIN 5 mcg/min (07/16/15 1600)    OBJECTIVE: Physical Exam: Filed Vitals:   07/16/15 1937 07/16/15 2126 07/17/15 0040 07/17/15 0357  BP: 114/64 111/68 108/62 131/80  Pulse: 67 70 74 63  Temp: 97.9 F (36.6 C)   97.6 F (36.4 C)  TempSrc: Oral   Oral  Resp: 18 20 19 18   Height:      Weight:    226 lb 13.7 oz (102.9 kg)  SpO2: 96% 97% 97% 98%    Intake/Output Summary (Last 24 hours) at 07/17/15 0746 Last data filed at 07/17/15 0258  Gross per 24 hour  Intake 823.75 ml  Output   1925 ml  Net -1101.25 ml    Telemetry reveals sinus rhythm  GEN- The patient is well appearing, alert and oriented x 3 today.   Head- normocephalic, atraumatic Eyes-  Sclera clear, conjunctiva pink Ears- hearing intact Oropharynx- clear Neck- supple, no JVP Lymph- no cervical lymphadenopathy Lungs- Clear to ausculation bilaterally, normal work of breathing Heart- Regular rate and rhythm, no murmurs, rubs or gallops, PMI not laterally displaced GI- soft, NT, ND, + BS Extremities- no clubbing, cyanosis, or edema Skin- no rash or lesion Psych- euthymic mood, full affect Neuro- strength and sensation are intact  LABS: Basic Metabolic Panel:  Recent Labs  07/15/15 0407 07/17/15 0519  NA 139 138  K 3.8 3.8  CL 103 108  CO2 26 26  GLUCOSE 167* 142*  BUN 12 12  CREATININE 1.03 0.99  CALCIUM 8.7* 8.9   Liver Function Tests: No results for input(s): AST, ALT, ALKPHOS, BILITOT, PROT, ALBUMIN in the last 72 hours. No results for input(s): LIPASE, AMYLASE in the last 72 hours. CBC:  Recent Labs  07/16/15 0500 07/17/15 0519  WBC 5.9 11.5*  HGB 14.5 14.5  HCT  43.4 43.0  MCV 90.8 90.0  PLT 191 185   Cardiac Enzymes:  Recent Labs  07/14/15 2300 07/15/15 0407 07/15/15 1020  TROPONINI <0.03 <0.03 <0.03   BNP: Invalid input(s): POCBNP D-Dimer: No results for input(s): DDIMER in the last 72 hours. Hemoglobin A1C: No results for input(s): HGBA1C in the last 72 hours. Fasting Lipid Panel:  Recent Labs  07/15/15 0407  CHOL 148  HDL 28*  LDLCALC UNABLE TO CALCULATE IF TRIGLYCERIDE OVER 400 mg/dL  TRIG 473*  CHOLHDL 5.3   Thyroid Function Tests: No results for input(s): TSH, T4TOTAL, T3FREE, THYROIDAB in the last 72 hours.  Invalid input(s): FREET3 Anemia Panel: No results for input(s): VITAMINB12, FOLATE, FERRITIN, TIBC, IRON, RETICCTPCT in the last 72 hours.  RADIOLOGY: Dg Chest 2 View  07/14/2015  CLINICAL DATA:  65 year old male with chest and abdominal pain with weakness for 1 day. Initial encounter. EXAM: CHEST  2 VIEW COMPARISON:  Chest radiographs 05/20/2013. FINDINGS: Mildly lower lung volumes. Stable mild cardiomegaly. Other mediastinal contours are within normal limits. Visualized tracheal air column is within normal limits. Chronic sequelae of CABG and median sternotomy. Mildly increased interstitial markings throughout both lungs with no pneumothorax, pleural effusion, or confluent pulmonary opacity. No acute osseous abnormality identified. Stable cholecystectomy clips. IMPRESSION: Mildly increased  interstitial markings in both lungs since 2014 could reflect acute viral/atypical respiratory infection, mild or developing interstitial edema (no pleural fluid identified), or chronic interstitial lung changes. Electronically Signed   By: Genevie Ann M.D.   On: 07/14/2015 13:11    ASSESSMENT AND PLAN:  Active Problems:   Unstable angina (HCC)   Coronary artery disease involving native coronary artery of native heart with unstable angina pectoris (HCC) Stenting of RCA with 0% residual stenosis.  TTE with normal LVEF.  Plan for  discharge today on aspirin and plavix.  Continue metoprolol and crestor as well.  Quay Simkin Meredith Leeds, MD 07/17/2015 7:46 AM

## 2015-07-19 ENCOUNTER — Encounter (HOSPITAL_COMMUNITY): Payer: Self-pay | Admitting: Interventional Cardiology

## 2015-07-19 MED FILL — Heparin Sodium (Porcine) 2 Unit/ML in Sodium Chloride 0.9%: INTRAMUSCULAR | Qty: 500 | Status: AC

## 2015-07-21 ENCOUNTER — Encounter: Payer: Self-pay | Admitting: *Deleted

## 2015-07-21 ENCOUNTER — Telehealth: Payer: Self-pay | Admitting: Cardiovascular Disease

## 2015-07-21 NOTE — Telephone Encounter (Signed)
[name] was discharged from [facility] 07-17-15. Patient needs to know what type of restrictions he needs to follow for his work.  States that he does A lot of heavy lifting.  Please call patient [phone].

## 2015-07-21 NOTE — Telephone Encounter (Signed)
Pt aware and work letter printed for pt to pick up tomorrow.

## 2015-07-21 NOTE — Telephone Encounter (Signed)
Would wait an additional week before doing so (March 13). He should commence cardiac rehab.

## 2015-07-21 NOTE — Telephone Encounter (Signed)
Pt is scheduled to return to work (per d/c summary and letter) March 6th. Pt says he lifts 70 lbs off and on all day and asking if ok to be lifting that amount of weight when he returns. Will forward to provider

## 2015-08-09 ENCOUNTER — Ambulatory Visit (INDEPENDENT_AMBULATORY_CARE_PROVIDER_SITE_OTHER): Payer: BLUE CROSS/BLUE SHIELD | Admitting: Cardiovascular Disease

## 2015-08-09 ENCOUNTER — Encounter: Payer: Self-pay | Admitting: Cardiovascular Disease

## 2015-08-09 ENCOUNTER — Encounter: Payer: Self-pay | Admitting: *Deleted

## 2015-08-09 VITALS — BP 110/80 | HR 68 | Ht 71.0 in | Wt 232.0 lb

## 2015-08-09 DIAGNOSIS — I10 Essential (primary) hypertension: Secondary | ICD-10-CM

## 2015-08-09 DIAGNOSIS — E785 Hyperlipidemia, unspecified: Secondary | ICD-10-CM

## 2015-08-09 DIAGNOSIS — Z955 Presence of coronary angioplasty implant and graft: Secondary | ICD-10-CM

## 2015-08-09 DIAGNOSIS — I25118 Atherosclerotic heart disease of native coronary artery with other forms of angina pectoris: Secondary | ICD-10-CM | POA: Diagnosis not present

## 2015-08-09 NOTE — Patient Instructions (Signed)
Your physician recommends that you continue on your current medications as directed. Please refer to the Current Medication list given to you today. You have been referred to Cardiac Rehab. Your physician recommends that you schedule a follow-up appointment in: 6 months. You will receive a reminder letter in the mail in about 4 months reminding you to call and schedule your appointment. If you don't receive this letter, please contact our office. 

## 2015-08-09 NOTE — Progress Notes (Signed)
Patient ID: Ryan Vaughn, male   DOB: 12/22/1950, 65 y.o.   MRN: [id number]      SUBJECTIVE: The patient is here to follow up for CAD. He was hospitalized in February for unstable angina and underwent stenting of the mid RCA with overlapping drug-eluting stents on 07/16/15.  Echo showed normal left ventricular systolic function and regional wall motion, EF 55-60%, with mild to moderate tricuspid regurgitation.  The patient denies any symptoms of chest pain, palpitations, shortness of breath, lightheadedness, dizziness, leg swelling, orthopnea, PND, and syncope.  Soc: His daughter, [name], is an Therapist, sports and works at [employer]. He has another daughter who is a respiratory therapist at [employer].   Review of Systems: As per "subjective", otherwise negative.  Allergies  Allergen Reactions  . Ivp Dye [Iodinated Diagnostic Agents] Other (See Comments)    headache    Current Outpatient Prescriptions  Medication Sig Dispense Refill  . aspirin 81 MG tablet Take 81 mg by mouth daily.    . clopidogrel (PLAVIX) 75 MG tablet Take 75 mg by mouth daily.    . cyclobenzaprine (FLEXERIL) 10 MG tablet Take 10 mg by mouth 3 (three) times daily as needed for muscle spasms.    . Multiple Vitamin (MULTIVITAMIN) tablet Take 1 tablet by mouth daily.    . nitroGLYCERIN (NITROSTAT) 0.4 MG SL tablet Place 1 tablet (0.4 mg total) under the tongue every 5 (five) minutes x 3 doses as needed for chest pain. 25 tablet 12  . rosuvastatin (CRESTOR) 20 MG tablet Take 20 mg by mouth daily.    . vitamin B-12 (CYANOCOBALAMIN) 500 MCG tablet Take 500 mcg by mouth daily.    . metoprolol succinate (TOPROL-XL) 50 MG 24 hr tablet Take 1 tablet (50 mg total) by mouth 2 (two) times daily. Take with or immediately following a meal.     No current facility-administered medications for this visit.    Past Medical History  Diagnosis Date  . Essential hypertension, benign   . Mixed hyperlipidemia   . Personal history of colonic  polyps   . Lumbago   . Kidney stones   . Arthritis   . Non Hodgkin's lymphoma ([place]) 2002    a. stage IV  . CAD (coronary artery disease)     a. DES to OM 1 and DES mLCX (12/05/13) b. CABG x1  ('92) c. 6 stents placed in 2009? (pt hx)     Past Surgical History  Procedure Laterality Date  . Coronary artery bypass graft  1992    "CABG X1"  . Coronary angioplasty with stent placement  ~ 1999; ~ 2009 X 2; 12/05/2013    "1 +3 + 2 + 2"  . Tonsillectomy    . Inguinal hernia repair    . Exploratory laparotomy with abdominal mass excision  08/2000    "looking for cancer"  . Cholecystectomy  08/2000  . Cystostomy w/ stent insertion    . Cystoscopy w/ ureteroscopy w/ lithotripsy    . Left heart catheterization with coronary angiogram N/A 12/05/2013    Procedure: LEFT HEART CATHETERIZATION WITH CORONARY ANGIOGRAM;  Surgeon: Blane Ohara, MD;  Location: [facility] CATH LAB;  Service: Cardiovascular;  Laterality: N/A;  . Percutaneous coronary stent intervention (pci-s)  12/05/2013    Procedure: PERCUTANEOUS CORONARY STENT INTERVENTION (PCI-S);  Surgeon: Blane Ohara, MD;  Location: [facility] CATH LAB;  Service: Cardiovascular;;  mid circumflex and prox OM2  . Cardiac catheterization N/A 07/16/2015    Procedure: Left Heart Cath and  Cors/Grafts Angiography;  Surgeon: Jettie Booze, MD;  Location: [facility] CV LAB;  Service: Cardiovascular;  Laterality: N/A;  . Cardiac catheterization N/A 07/16/2015    Procedure: Coronary Stent Intervention;  Surgeon: Jettie Booze, MD;  Location: [facility] CV LAB;  Service: Cardiovascular;  Laterality: N/A;    Social History   Social History  . Marital Status: Single    Spouse Name: N/A  . Number of Children: N/A  . Years of Education: N/A   Occupational History  . Not on file.   Social History Main Topics  . Smoking status: Never Smoker   . Smokeless tobacco: Current User    Types: Snuff     Comment: 12/05/2013 "use snuff q now and again"  . Alcohol  Use: 0.0 oz/week    0 Standard drinks or equivalent per week     Comment: 12/05/2013 "maybe 2 cans beer/yr"  . Drug Use: No  . Sexual Activity: Yes   Other Topics Concern  . Not on file   Social History Narrative     Filed Vitals:   08/09/15 1135  BP: 110/80  Pulse: 68  Height: 5\' 11"  (1.803 m)  Weight: 232 lb (105.235 kg)  SpO2: 98%    PHYSICAL EXAM General: NAD HEENT: Normal. Neck: No JVD, no thyromegaly. Lungs: Clear to auscultation bilaterally with normal respiratory effort. CV: Nondisplaced PMI.  Regular rate and rhythm, normal S1/S2, no S3/S4, no murmur. No pretibial or periankle edema.  No carotid bruit.   Abdomen: Soft, nontender, no distention.  Neurologic: Alert and oriented.  Psych: Normal affect. Skin: Normal. Musculoskeletal: No gross deformities.  ECG: Most recent ECG reviewed.      ASSESSMENT AND PLAN: 1. CAD s/p 1-vessel CABG s/p PCI of first OM and mid LCx and mid RCA PCI (06/2015): Symptomatically stable. Continue ASA, Toprol-XL, Plavix, and Crestor. Will continue dual antiplatelet therapy indefinitely. Will make cardiac rehab referral.  2. Essential HTN: Controlled. No changes.  3. Hyperlipidemia: Continue Crestor 20 mg daily.   Dispo: f/u 6 months.   Kate Sable, M.D., F.A.C.C.

## 2016-02-22 ENCOUNTER — Encounter: Payer: Self-pay | Admitting: *Deleted

## 2016-02-22 ENCOUNTER — Ambulatory Visit (INDEPENDENT_AMBULATORY_CARE_PROVIDER_SITE_OTHER): Payer: BLUE CROSS/BLUE SHIELD | Admitting: Cardiovascular Disease

## 2016-02-22 VITALS — BP 112/78 | HR 63 | Ht 71.0 in | Wt 224.0 lb

## 2016-02-22 DIAGNOSIS — E7800 Pure hypercholesterolemia, unspecified: Secondary | ICD-10-CM

## 2016-02-22 DIAGNOSIS — I10 Essential (primary) hypertension: Secondary | ICD-10-CM | POA: Diagnosis not present

## 2016-02-22 DIAGNOSIS — I25810 Atherosclerosis of coronary artery bypass graft(s) without angina pectoris: Secondary | ICD-10-CM

## 2016-02-22 DIAGNOSIS — Z955 Presence of coronary angioplasty implant and graft: Secondary | ICD-10-CM | POA: Diagnosis not present

## 2016-02-22 MED ORDER — NITROGLYCERIN 0.4 MG SL SUBL: 0.4000 mg | SUBLINGUAL_TABLET | SUBLINGUAL | 3 refills | Status: DC | PRN

## 2016-02-22 NOTE — Progress Notes (Signed)
SUBJECTIVE: The patient is here to follow up for CAD. He was hospitalized in February 2017 for unstable angina and underwent stenting of the mid RCA with overlapping drug-eluting stents on 07/16/15.  Echo showed normal left ventricular systolic function and regional wall motion, EF 55-60%, with mild to moderate tricuspid regurgitation.  The patient denies any symptoms of chest pain, palpitations, shortness of breath, lightheadedness, dizziness, leg swelling, orthopnea, PND, and syncope.  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  Soc: His daughter, [name], is an Therapist, sports and works at [employer]. He has another daughter who is a respiratory therapist at [place].   Review of Systems: As per "subjective", otherwise negative.  Allergies  Allergen Reactions  . Ivp Dye [Iodinated Diagnostic Agents] Other (See Comments)    headache    Current Outpatient Prescriptions  Medication Sig Dispense Refill  . aspirin 81 MG tablet Take 81 mg by mouth daily.    . clopidogrel (PLAVIX) 75 MG tablet Take 75 mg by mouth daily.    . cyclobenzaprine (FLEXERIL) 10 MG tablet Take 10 mg by mouth 3 (three) times daily as needed for muscle spasms.    . metoprolol succinate (TOPROL-XL) 50 MG 24 hr tablet Take 1 tablet (50 mg total) by mouth 2 (two) times daily. Take with or immediately following a meal.    . Multiple Vitamin (MULTIVITAMIN) tablet Take 1 tablet by mouth daily.    . nitroGLYCERIN (NITROSTAT) 0.4 MG SL tablet Place 1 tablet (0.4 mg total) under the tongue every 5 (five) minutes x 3 doses as needed for chest pain. 25 tablet 12  . rosuvastatin (CRESTOR) 20 MG tablet Take 20 mg by mouth daily.    . vitamin B-12 (CYANOCOBALAMIN) 500 MCG tablet Take 500 mcg by mouth daily.     No current facility-administered medications for this visit.     Past Medical History:  Diagnosis Date  . Arthritis   . CAD (coronary artery disease)    a. DES to OM 1 and DES mLCX (12/05/13) b. CABG x1   ('92) c. 6 stents placed in 2009? (pt hx)   . Essential hypertension, benign   . Kidney stones   . Lumbago   . Mixed hyperlipidemia   . Non Hodgkin's lymphoma ([place]) 2002   a. stage IV  . Personal history of colonic polyps     Past Surgical History:  Procedure Laterality Date  . CARDIAC CATHETERIZATION N/A 07/16/2015   Procedure: Left Heart Cath and Cors/Grafts Angiography;  Surgeon: Jettie Booze, MD;  Location: [place] [facility];  Service: Cardiovascular;  Laterality: N/A;  . CARDIAC CATHETERIZATION N/A 07/16/2015   Procedure: Coronary Stent Intervention;  Surgeon: Jettie Booze, MD;  Location: [place] [facility];  Service: Cardiovascular;  Laterality: N/A;  . CHOLECYSTECTOMY  08/2000  . CORONARY ANGIOPLASTY WITH STENT PLACEMENT  ~ 1999; ~ 2009 X 2; 12/05/2013   "1 +3 + 2 + 2"  . [place]   "CABG X1"  . CYSTOSCOPY W/ URETEROSCOPY W/ LITHOTRIPSY    . CYSTOSTOMY W/ STENT INSERTION    . EXPLORATORY LAPAROTOMY WITH ABDOMINAL MASS EXCISION  08/2000   "looking for cancer"  . INGUINAL HERNIA REPAIR    . LEFT HEART CATHETERIZATION WITH CORONARY ANGIOGRAM N/A 12/05/2013   Procedure: LEFT HEART CATHETERIZATION WITH CORONARY ANGIOGRAM;  Surgeon: Blane Ohara, MD;  Location[facility];  Service: Cardiovascular;  Laterality: N/A;  . PERCUTANEOUS CORONARY STENT  INTERVENTION (PCI-S)  12/05/2013   Procedure: PERCUTANEOUS CORONARY STENT INTERVENTION (PCI-S);  Surgeon: Blane Ohara, MD;  Location: [facility] CATH LAB;  Service: Cardiovascular;;  mid circumflex and prox OM2  . TONSILLECTOMY      Social History   Social History  . Marital status: Single    Spouse name: N/A  . Number of children: N/A  . Years of education: N/A   Occupational History  . Not on file.   Social History Main Topics  . Smoking status: Never Smoker  . Smokeless tobacco: Current User    Types: Snuff     Comment: 12/05/2013 "use snuff q now and again"  . Alcohol use 0.0 oz/week      Comment: 12/05/2013 "maybe 2 cans beer/yr"  . Drug use: No  . Sexual activity: Yes   Other Topics Concern  . Not on file   Social History Narrative  . No narrative on file     Vitals:   02/22/16 1058  BP: 112/78  Pulse: 63  SpO2: 98%  Weight: 224 lb (101.6 kg)  Height: 5\' 11"  (1.803 m)    PHYSICAL EXAM General: NAD HEENT: Normal. Neck: No JVD, no thyromegaly. Lungs: Clear to auscultation bilaterally with normal respiratory effort. CV: Nondisplaced PMI.  Regular rate and rhythm, normal S1/S2, no S3/S4, no murmur. No pretibial or periankle edema.  No carotid bruit.   Abdomen: Soft, nontender, obese.  Neurologic: Alert and oriented.  Psych: Normal affect. Skin: Normal. Musculoskeletal: No gross deformities.    ECG: Most recent ECG reviewed.      ASSESSMENT AND PLAN: 1. CAD s/p 1-vessel CABG s/p PCI of first OM and mid LCx and mid RCA PCI (06/2015): Symptomatically stable. Continue ASA, Toprol-XL, Plavix, and Crestor. Will continue dual antiplatelet therapy indefinitely.  2. Essential HTN: Controlled. No changes.  3. Hyperlipidemia: I reviewed his labs from September 2017. LDL is at goal. Triglycerides were mildly elevated. Continue Crestor 20 mg daily.   Dispo: f/u 6 months.   Kate Sable, M.D., F.A.C.C.

## 2016-02-22 NOTE — Patient Instructions (Signed)

## 2016-08-24 ENCOUNTER — Encounter: Payer: Self-pay | Admitting: *Deleted

## 2016-08-24 ENCOUNTER — Encounter: Payer: Self-pay | Admitting: Cardiovascular Disease

## 2016-08-24 ENCOUNTER — Ambulatory Visit (INDEPENDENT_AMBULATORY_CARE_PROVIDER_SITE_OTHER): Payer: BLUE CROSS/BLUE SHIELD | Admitting: Cardiovascular Disease

## 2016-08-24 VITALS — BP 100/70 | HR 64 | Ht 71.0 in | Wt 238.0 lb

## 2016-08-24 DIAGNOSIS — I10 Essential (primary) hypertension: Secondary | ICD-10-CM

## 2016-08-24 DIAGNOSIS — I739 Peripheral vascular disease, unspecified: Secondary | ICD-10-CM

## 2016-08-24 DIAGNOSIS — E7800 Pure hypercholesterolemia, unspecified: Secondary | ICD-10-CM

## 2016-08-24 DIAGNOSIS — E119 Type 2 diabetes mellitus without complications: Secondary | ICD-10-CM | POA: Diagnosis not present

## 2016-08-24 DIAGNOSIS — I25118 Atherosclerotic heart disease of native coronary artery with other forms of angina pectoris: Secondary | ICD-10-CM

## 2016-08-24 MED ORDER — NITROGLYCERIN 0.4 MG SL SUBL: 0.4000 mg | SUBLINGUAL_TABLET | SUBLINGUAL | 3 refills | Status: DC | PRN

## 2016-08-24 NOTE — Patient Instructions (Signed)
Medication Instructions:  Continue all current medications.  Labwork: none  Testing/Procedures:  Your physician has requested that you have an ankle brachial index (ABI). During this test an ultrasound and blood pressure cuff are used to evaluate the arteries that supply the arms and legs with blood. Allow thirty minutes for this exam. There are no restrictions or special instructions.  Office will contact with results via phone or letter.    Follow-Up: Your physician wants you to follow up in:  1 year.  You will receive a reminder letter in the mail one-two months in advance.  If you don't receive a letter, please call our office to schedule the follow up appointment   Any Other Special Instructions Will Be Listed Below (If Applicable).  If you need a refill on your cardiac medications before your next appointment, please call your pharmacy.  

## 2016-08-24 NOTE — Addendum Note (Signed)
Addended by: Laurine Blazer on: 08/24/2016 09:56 AM   Modules accepted: Orders

## 2016-08-24 NOTE — Progress Notes (Signed)
SUBJECTIVE: The patient presents for follow-up of coronary disease. He was hospitalized in February 2017 for unstable angina and underwent stenting of the mid RCA with overlapping drug-eluting stents on 07/16/15.  Echocardiogram at that time demonstrated normal left ventricular systolic function and regional wall motion, LVEF 55-60%, with mild to moderate tricuspid regurgitation.  With respect to coronary artery disease, he has been feeling well and denies exertional chest pain and dyspnea. 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  HbA1c was recently checked and found to be elevated at 7%.  He has been having cramping in his calves and his legs while walking. He has questions about peripheral arterial disease.  ECG performed in the office today which I ordered and personally interpreted demonstrates normal sinus rhythm with LVH and repolarization abnormalities.    Soc: His daughter, [name], is an Therapist, sports and works at [employer]. He has another daughter who is a respiratory therapist at [place].   Review of Systems: As per "subjective", otherwise negative.  Allergies  Allergen Reactions  . Ivp Dye [Iodinated Diagnostic Agents] Other (See Comments)    headache    Current Outpatient Prescriptions  Medication Sig Dispense Refill  . aspirin 81 MG tablet Take 81 mg by mouth daily.    . clopidogrel (PLAVIX) 75 MG tablet Take 75 mg by mouth daily.    . cyclobenzaprine (FLEXERIL) 10 MG tablet Take 10 mg by mouth 3 (three) times daily as needed for muscle spasms.    . metoprolol succinate (TOPROL-XL) 50 MG 24 hr tablet Take 1 tablet (50 mg total) by mouth 2 (two) times daily. Take with or immediately following a meal.    . Multiple Vitamin (MULTIVITAMIN) tablet Take 1 tablet by mouth daily.    . nitroGLYCERIN (NITROSTAT) 0.4 MG SL tablet Place 1 tablet (0.4 mg total) under the tongue every 5 (five) minutes x 3 doses as needed for chest pain. 25 tablet 3  . rosuvastatin  (CRESTOR) 20 MG tablet Take 20 mg by mouth daily.    . vitamin B-12 (CYANOCOBALAMIN) 500 MCG tablet Take 500 mcg by mouth daily.     No current facility-administered medications for this visit.     Past Medical History:  Diagnosis Date  . Arthritis   . CAD (coronary artery disease)    a. DES to OM 1 and DES mLCX (12/05/13) b. CABG x1  ('92) c. 6 stents placed in 2009? (pt hx)   . Essential hypertension, benign   . Kidney stones   . Lumbago   . Mixed hyperlipidemia   . Non Hodgkin's lymphoma ([place]) 2002   a. stage IV  . Personal history of colonic polyps     Past Surgical History:  Procedure Laterality Date  . CARDIAC CATHETERIZATION N/A 07/16/2015   Procedure: Left Heart Cath and Cors/Grafts Angiography;  Surgeon: Jettie Booze, MD;  Location: [place] [facility];  Service: Cardiovascular;  Laterality: N/A;  . CARDIAC CATHETERIZATION N/A 07/16/2015   Procedure: Coronary Stent Intervention;  Surgeon: Jettie Booze, MD;  Location: [place] [facility];  Service: Cardiovascular;  Laterality: N/A;  . CHOLECYSTECTOMY  08/2000  . CORONARY ANGIOPLASTY WITH STENT PLACEMENT  ~ 1999; ~ 2009 X 2; 12/05/2013   "1 +3 + 2 + 2"  . [place]   "CABG X1"  . CYSTOSCOPY W/ URETEROSCOPY W/ LITHOTRIPSY    . CYSTOSTOMY W/ STENT INSERTION    . EXPLORATORY LAPAROTOMY WITH ABDOMINAL MASS  EXCISION  08/2000   "looking for cancer"  . INGUINAL HERNIA REPAIR    . LEFT HEART CATHETERIZATION WITH CORONARY ANGIOGRAM N/A 12/05/2013   Procedure: LEFT HEART CATHETERIZATION WITH CORONARY ANGIOGRAM;  Surgeon: Blane Ohara, MD;  Location: [facility] CATH LAB;  Service: Cardiovascular;  Laterality: N/A;  . PERCUTANEOUS CORONARY STENT INTERVENTION (PCI-S)  12/05/2013   Procedure: PERCUTANEOUS CORONARY STENT INTERVENTION (PCI-S);  Surgeon: Blane Ohara, MD;  Location: [facility] CATH LAB;  Service: Cardiovascular;;  mid circumflex and prox OM2  . TONSILLECTOMY      Social History   Social History   . Marital status: Single    Spouse name: N/A  . Number of children: N/A  . Years of education: N/A   Occupational History  . Not on file.   Social History Main Topics  . Smoking status: Never Smoker  . Smokeless tobacco: Current User    Types: Snuff     Comment: 12/05/2013 "use snuff q now and again"  . Alcohol use 0.0 oz/week     Comment: 12/05/2013 "maybe 2 cans beer/yr"  . Drug use: No  . Sexual activity: Yes   Other Topics Concern  . Not on file   Social History Narrative  . No narrative on file     Vitals:   08/24/16 0904  BP: 100/70  Pulse: 64  SpO2: 98%  Weight: 238 lb (108 kg)  Height: 5\' 11"  (1.803 m)    PHYSICAL EXAM General: NAD HEENT: Normal. Neck: No JVD, no thyromegaly. Lungs: Clear to auscultation bilaterally with normal respiratory effort. CV: Nondisplaced PMI.  Regular rate and rhythm, normal S1/S2, no S3/S4, no murmur. No pretibial or periankle edema.  No carotid bruit.   Abdomen: Soft, nontender, no distention.  Neurologic: Alert and oriented.  Psych: Normal affect. Skin: Normal. Musculoskeletal: No gross deformities.    ECG: Most recent ECG reviewed.      ASSESSMENT AND PLAN: 1. CAD s/p 1-vessel CABG s/p PCI of first OM and mid LCx and mid RCA PCI (06/2015): Symptomatically stable. Continue aspirin, Plavix (due to multiple stents), metoprolol, and Crestor.  2. Hypertension: Controlled on present therapy. No changes.  3. Hyperlipidemia: I will request a copy of his lipids from his PCP. Continue Crestor.  4. Claudication: I will obtain ABI's.  5. Type 2 diabetes: Recent diagnosis, HbA1C 7%. He may soon start therapy.   Dispo: fu 1 year.   Kate Sable, M.D., F.A.C.C.

## 2016-08-30 ENCOUNTER — Other Ambulatory Visit: Payer: Self-pay | Admitting: Cardiovascular Disease

## 2016-08-30 DIAGNOSIS — I739 Peripheral vascular disease, unspecified: Secondary | ICD-10-CM

## 2016-09-21 ENCOUNTER — Telehealth: Payer: Self-pay | Admitting: *Deleted

## 2016-09-21 ENCOUNTER — Ambulatory Visit: Payer: BLUE CROSS/BLUE SHIELD

## 2016-09-21 DIAGNOSIS — R202 Paresthesia of skin: Secondary | ICD-10-CM | POA: Diagnosis not present

## 2016-09-21 DIAGNOSIS — I739 Peripheral vascular disease, unspecified: Secondary | ICD-10-CM

## 2016-09-21 NOTE — Telephone Encounter (Signed)
Notes recorded by Laurine Blazer, LPN on 07/01/5619 at 3:08 PM EDT Patient notified. Copy to pmd. ------  Notes recorded by Herminio Commons, MD on 09/21/2016 at 1:00 PM EDT No significant blockages.

## 2017-08-24 ENCOUNTER — Telehealth: Payer: Self-pay

## 2017-08-24 ENCOUNTER — Encounter: Payer: Self-pay | Admitting: Cardiovascular Disease

## 2017-08-24 ENCOUNTER — Encounter: Payer: Self-pay | Admitting: *Deleted

## 2017-08-24 ENCOUNTER — Ambulatory Visit: Payer: BLUE CROSS/BLUE SHIELD | Admitting: Cardiovascular Disease

## 2017-08-24 VITALS — BP 100/74 | HR 80 | Ht 71.0 in | Wt 234.0 lb

## 2017-08-24 DIAGNOSIS — I10 Essential (primary) hypertension: Secondary | ICD-10-CM | POA: Diagnosis not present

## 2017-08-24 DIAGNOSIS — E785 Hyperlipidemia, unspecified: Secondary | ICD-10-CM

## 2017-08-24 DIAGNOSIS — Z955 Presence of coronary angioplasty implant and graft: Secondary | ICD-10-CM | POA: Diagnosis not present

## 2017-08-24 DIAGNOSIS — I25118 Atherosclerotic heart disease of native coronary artery with other forms of angina pectoris: Secondary | ICD-10-CM | POA: Diagnosis not present

## 2017-08-24 MED ORDER — RIVAROXABAN 2.5 MG PO TABS: 2.5000 mg | ORAL_TABLET | Freq: Two times a day (BID) | ORAL | 0 refills | Status: DC

## 2017-08-24 NOTE — Progress Notes (Signed)
SUBJECTIVE: The patient presents for follow-up of coronary disease. He was hospitalized in February 2017 for unstable angina and underwent stenting of the mid RCA with overlapping drug-eluting stents on 07/16/15.  Echocardiogram at that time demonstrated normal left ventricular systolic function and regional wall motion, LVEF 55-60%, with mild to moderate tricuspid regurgitation.  He underwent unremarkable ABIs in May 2018.  The patient denies any symptoms of chest pain, palpitations, shortness of breath, lightheadedness, dizziness, leg swelling, orthopnea, PND, and syncope.  ECG performed today which I personally reviewed demonstrated sinus bradycardia, 58 bpm, with some nonspecific T wave abnormalities in inferolateral leads.   Soc Hx: His daughter, [name], is an Therapist, sports and works at [employer]. He has another daughter who is a respiratory therapist at [employer]. Another daughter is a stay at home mother.     Review of Systems: As per "subjective", otherwise negative.  Allergies  Allergen Reactions  . Ivp Dye [Iodinated Diagnostic Agents] Other (See Comments)    headache    Current Outpatient Medications  Medication Sig Dispense Refill  . aspirin 81 MG tablet Take 81 mg by mouth daily.    . cyclobenzaprine (FLEXERIL) 10 MG tablet Take 10 mg by mouth 3 (three) times daily as needed for muscle spasms.    . metoprolol succinate (TOPROL-XL) 50 MG 24 hr tablet Take 1 tablet (50 mg total) by mouth 2 (two) times daily. Take with or immediately following a meal.    . Multiple Vitamin (MULTIVITAMIN) tablet Take 1 tablet by mouth daily.    . nitroGLYCERIN (NITROSTAT) 0.4 MG SL tablet Place 1 tablet (0.4 mg total) under the tongue every 5 (five) minutes x 3 doses as needed for chest pain. 25 tablet 3  . rosuvastatin (CRESTOR) 20 MG tablet Take 20 mg by mouth daily.    . vitamin B-12 (CYANOCOBALAMIN) 500 MCG tablet Take 500 mcg by mouth daily.    . rivaroxaban (XARELTO) 2.5 MG TABS  tablet Take 1 tablet (2.5 mg total) by mouth 2 (two) times daily. 60 tablet 0   No current facility-administered medications for this visit.     Past Medical History:  Diagnosis Date  . Arthritis   . CAD (coronary artery disease)    a. DES to OM 1 and DES mLCX (12/05/13) b. CABG x1  ('92) c. 6 stents placed in 2009? (pt hx)   . Essential hypertension, benign   . Kidney stones   . Lumbago   . Mixed hyperlipidemia   . Non Hodgkin's lymphoma ([place]) 2002   a. stage IV  . Personal history of colonic polyps     Past Surgical History:  Procedure Laterality Date  . CARDIAC CATHETERIZATION N/A 07/16/2015   Procedure: Left Heart Cath and Cors/Grafts Angiography;  Surgeon: Jettie Booze, MD;  Location: [place] [facility];  Service: Cardiovascular;  Laterality: N/A;  . CARDIAC CATHETERIZATION N/A 07/16/2015   Procedure: Coronary Stent Intervention;  Surgeon: Jettie Booze, MD;  Location: [place] [facility];  Service: Cardiovascular;  Laterality: N/A;  . CHOLECYSTECTOMY  08/2000  . CORONARY ANGIOPLASTY WITH STENT PLACEMENT  ~ 1999; ~ 2009 X 2; 12/05/2013   "1 +3 + 2 + 2"  . [place]   "CABG X1"  . CYSTOSCOPY W/ URETEROSCOPY W/ LITHOTRIPSY    . CYSTOSTOMY W/ STENT INSERTION    . EXPLORATORY LAPAROTOMY WITH ABDOMINAL MASS EXCISION  08/2000   "looking for cancer"  . INGUINAL HERNIA REPAIR    .  LEFT HEART CATHETERIZATION WITH CORONARY ANGIOGRAM N/A 12/05/2013   Procedure: LEFT HEART CATHETERIZATION WITH CORONARY ANGIOGRAM;  Surgeon: Blane Ohara, MD;  Location: [facility] CATH LAB;  Service: Cardiovascular;  Laterality: N/A;  . PERCUTANEOUS CORONARY STENT INTERVENTION (PCI-S)  12/05/2013   Procedure: PERCUTANEOUS CORONARY STENT INTERVENTION (PCI-S);  Surgeon: Blane Ohara, MD;  Location: [facility] CATH LAB;  Service: Cardiovascular;;  mid circumflex and prox OM2  . TONSILLECTOMY      Social History   Socioeconomic History  . Marital status: Single    Spouse name:  Not on file  . Number of children: Not on file  . Years of education: Not on file  . Highest education level: Not on file  Occupational History  . Not on file  Social Needs  . Financial resource strain: Not on file  . Food insecurity:    Worry: Not on file    Inability: Not on file  . Transportation needs:    Medical: Not on file    Non-medical: Not on file  Tobacco Use  . Smoking status: Never Smoker  . Smokeless tobacco: Current User    Types: Snuff  . Tobacco comment: 12/05/2013 "use snuff q now and again"  Substance and Sexual Activity  . Alcohol use: Yes    Alcohol/week: 0.0 oz    Comment: 12/05/2013 "maybe 2 cans beer/yr"  . Drug use: No  . Sexual activity: Yes  Lifestyle  . Physical activity:    Days per week: Not on file    Minutes per session: Not on file  . Stress: Not on file  Relationships  . Social connections:    Talks on phone: Not on file    Gets together: Not on file    Attends religious service: Not on file    Active member of club or organization: Not on file    Attends meetings of clubs or organizations: Not on file    Relationship status: Not on file  . Intimate partner violence:    Fear of current or ex partner: Not on file    Emotionally abused: Not on file    Physically abused: Not on file    Forced sexual activity: Not on file  Other Topics Concern  . Not on file  Social History Narrative  . Not on file     Vitals:   08/24/17 0817  BP: 100/74  Pulse: 80  SpO2: 96%  Weight: 234 lb (106.1 kg)  Height: 5\' 11"  (1.803 m)    Wt Readings from Last 3 Encounters:  08/24/17 234 lb (106.1 kg)  08/24/16 238 lb (108 kg)  02/22/16 224 lb (101.6 kg)     PHYSICAL EXAM General: NAD HEENT: Normal. Neck: No JVD, no thyromegaly. Lungs: Clear to auscultation bilaterally with normal respiratory effort. CV: Regular rate and rhythm, normal S1/S2, no S3/S4, no murmur. No pretibial or periankle edema.  No carotid bruit.   Abdomen: Soft, nontender,  no distention.  Neurologic: Alert and oriented.  Psych: Normal affect. Skin: Normal. Musculoskeletal: No gross deformities.    ECG: Most recent ECG reviewed.   Labs: Lab Results  Component Value Date/Time   K 3.8 07/17/2015 05:19 AM   BUN 12 07/17/2015 05:19 AM   CREATININE 0.99 07/17/2015 05:19 AM   HGB 14.5 07/17/2015 05:19 AM     Lipids: Lab Results  Component Value Date/Time   LDLCALC UNABLE TO CALCULATE IF TRIGLYCERIDE OVER 400 mg/dL 07/15/2015 04:07 AM   CHOL 148 07/15/2015 04:07 AM  TRIG 473 (H) 07/15/2015 04:07 AM   HDL 28 (L) 07/15/2015 04:07 AM       ASSESSMENT AND PLAN:  1. CAD s/p 1-vessel CABG s/p PCI of first OM and mid LCx and mid RCA PCI (06/2015): Symptomatically stable. Continue aspirin, metoprolol, and Crestor.  I have kept him on Plavix as well due to having multiple stents but will discontinue this and switch to Xarelto 2.5 mg twice daily.  If this is not cost effective for him, I will keep him on aspirin alone.  2. Hypertension: Controlled on present therapy. No changes.  3. Hyperlipidemia: I will request a copy of his lipids from his PCP. Continue Crestor.      Disposition: Follow up 1 year   Kate Sable, M.D., F.A.C.C.

## 2017-08-24 NOTE — Patient Instructions (Addendum)
Medication Instructions:  Your physician has recommended you make the following change in your medication:    STOP Plavix   START Xarelto 2.5 mg twice daily   Please continue all other medications as prescribed  Labwork: NONE  Testing/Procedures: NONE  Follow-Up: Your physician wants you to follow-up in: [place]. You will receive a reminder letter in the mail two months in advance. If you don't receive a letter, please call our office to schedule the follow-up appointment.  Any Other Special Instructions Will Be Listed Below (If Applicable).  If you need a refill on your cardiac medications before your next appointment, please call your pharmacy.

## 2017-08-24 NOTE — Telephone Encounter (Signed)
Patient cannot afford xarelto. Pharmacy altered not to fill. Dr. Bronson Ing made aware and stated to continue with asa 81 and discontinuing plavix. Patient made aware.

## 2017-09-22 IMAGING — CR DG CHEST 2V
2 series · 2 of 2 positions shown · non-contrast
Comparison: Chest radiographs 05/20/2013.

CLINICAL DATA: 64-year-old male with chest and abdominal pain with
weakness for 1 day. Initial encounter.

EXAM:
CHEST  2 VIEW

[chest pa]
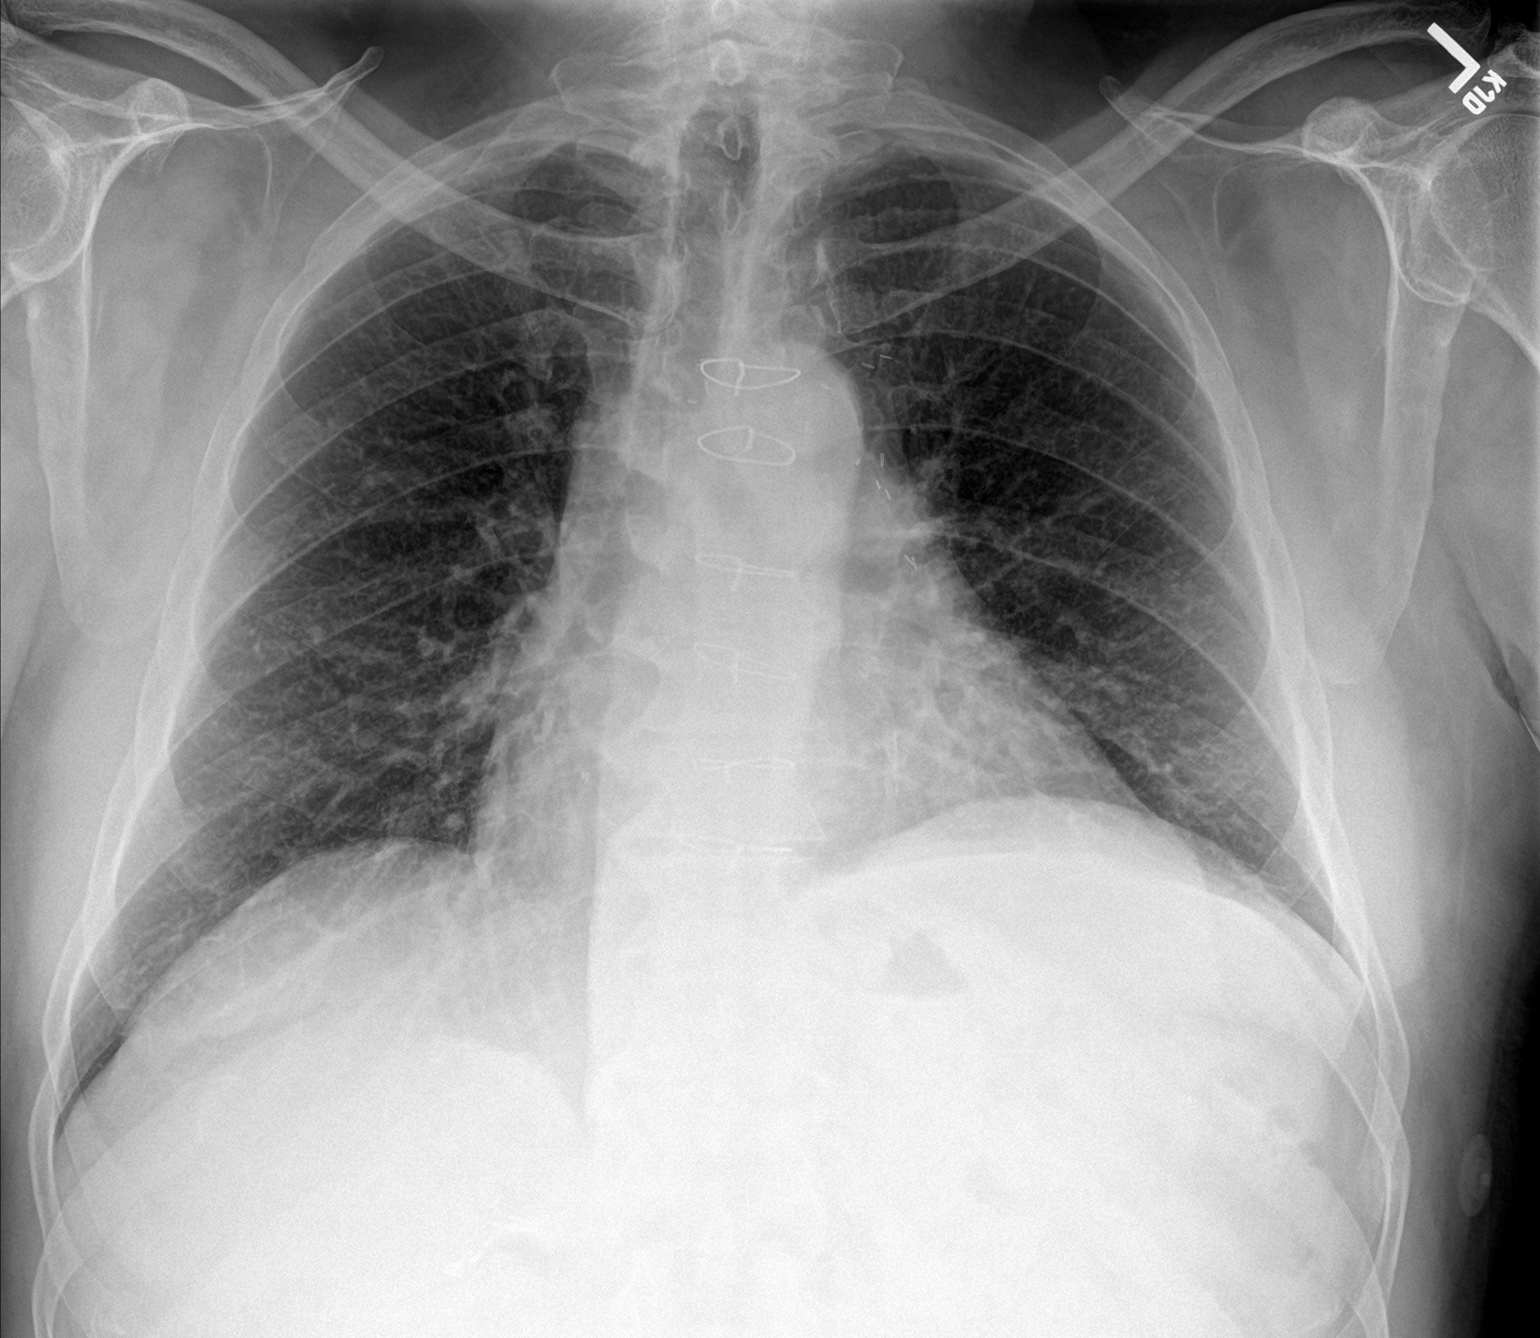

[chest lat]
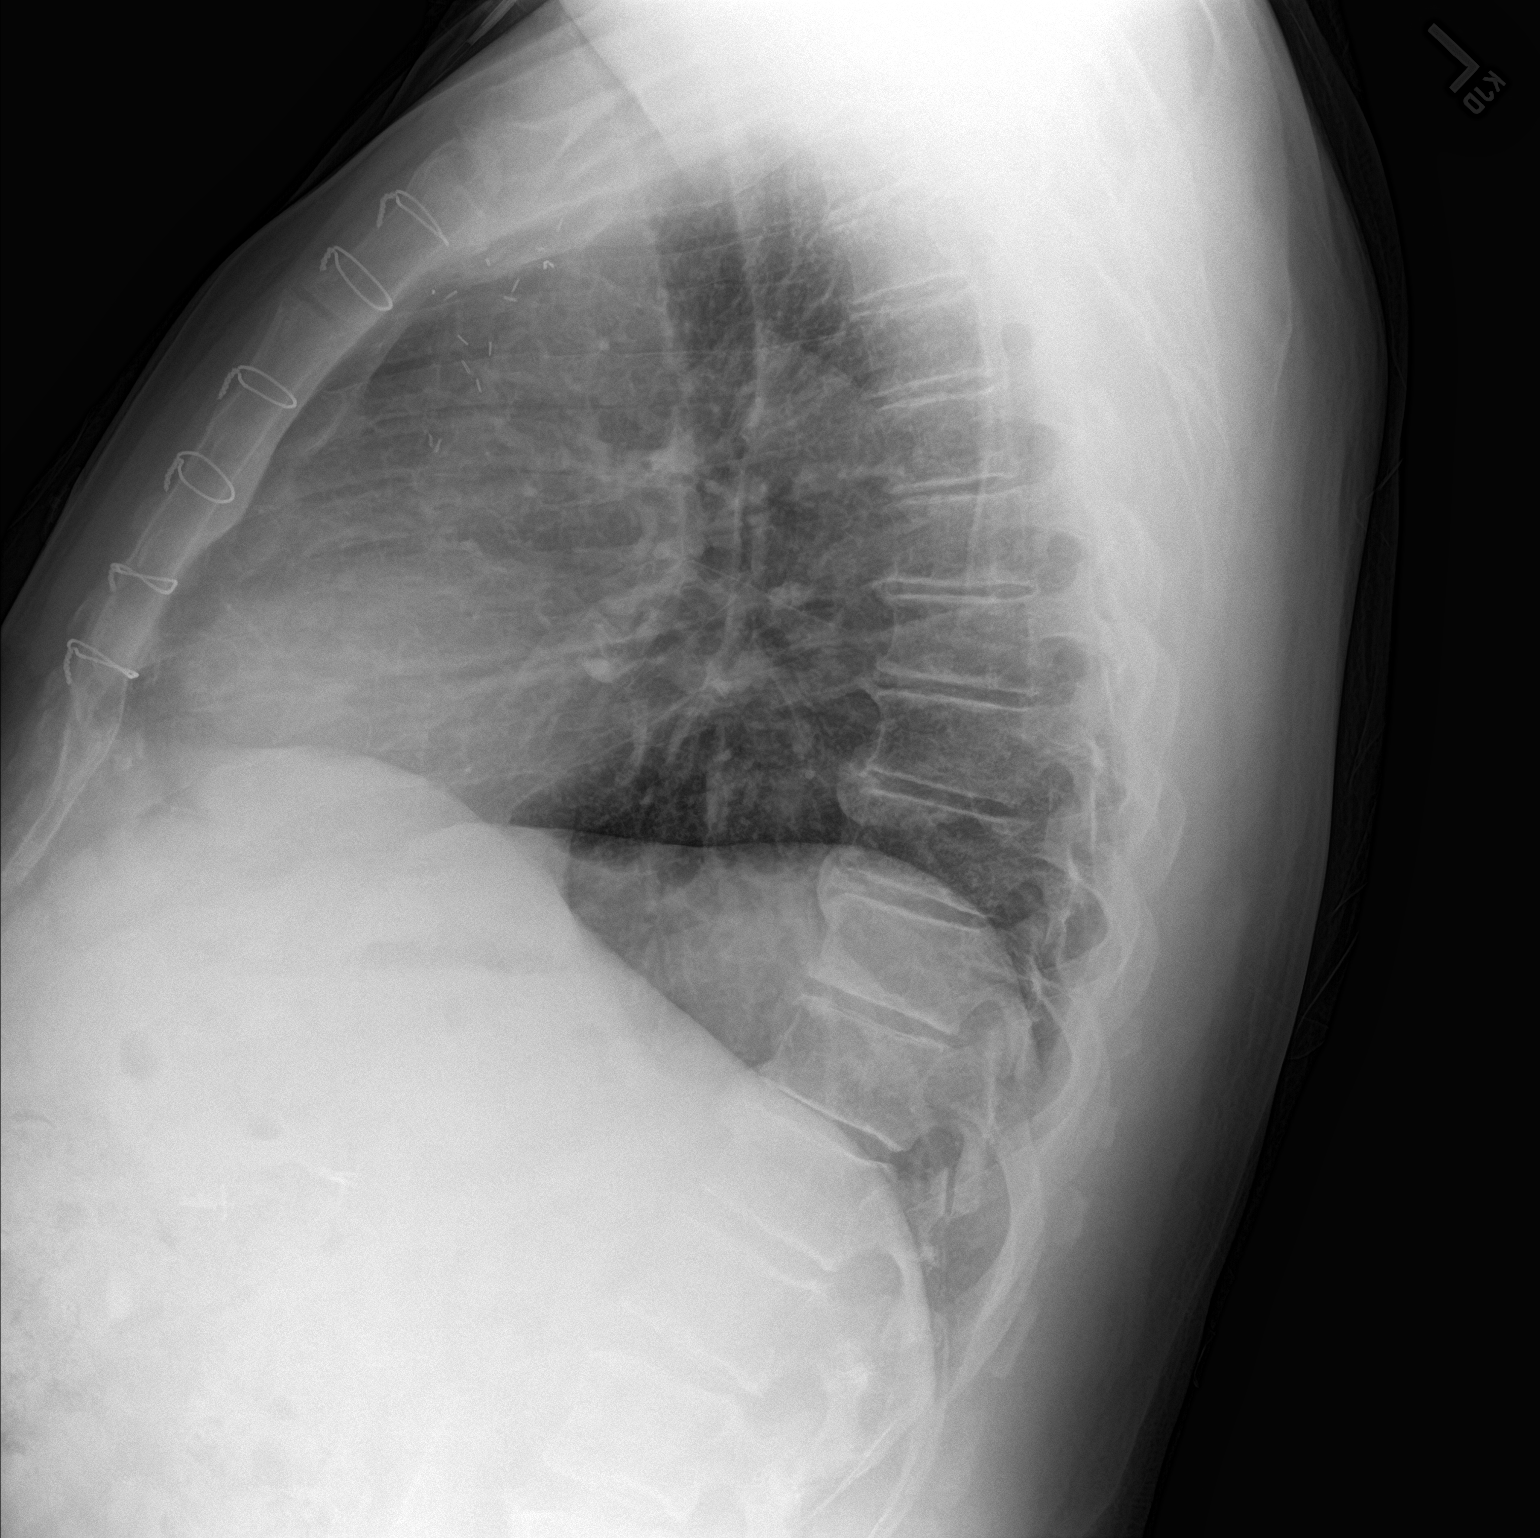

[2 of 2 positions shown; findings below may reference images not displayed]

FINDINGS: Mildly lower lung volumes. Stable mild cardiomegaly. Other
mediastinal contours are within normal limits. Visualized tracheal
air column is within normal limits. Chronic sequelae of CABG and
median sternotomy. Mildly increased interstitial markings throughout
both lungs with no pneumothorax, pleural effusion, or confluent
pulmonary opacity. No acute osseous abnormality identified. Stable
cholecystectomy clips.
IMPRESSION: Mildly increased interstitial markings in both lungs since 1003
could reflect acute viral/atypical respiratory infection, mild or
developing interstitial edema (no pleural fluid identified), or
chronic interstitial lung changes.

## 2017-10-11 ENCOUNTER — Other Ambulatory Visit: Payer: Self-pay | Admitting: Cardiovascular Disease

## 2017-12-24 ENCOUNTER — Encounter: Payer: Self-pay | Admitting: *Deleted

## 2017-12-24 ENCOUNTER — Other Ambulatory Visit: Payer: Self-pay | Admitting: Cardiovascular Disease

## 2017-12-24 NOTE — Telephone Encounter (Signed)
Patient called requesting refill on xarelto 2.5 mg  Please send to [facility]. [place], [place]

## 2017-12-24 NOTE — Telephone Encounter (Signed)
Discussed Xarelto with patient.  Per last telephone note - stated he could not afford the medication.  Patient stated that he did go ahead & buy the first month supply.  States that he will have to pay his $400 deductible first anyway.  90 day supply sent to [facility] at patient request.  Will also mail patient info on low income subsidy to see if he qualifies.  Patient verbalized understanding.

## 2017-12-25 ENCOUNTER — Other Ambulatory Visit: Payer: Self-pay | Admitting: *Deleted

## 2017-12-25 MED ORDER — RIVAROXABAN 2.5 MG PO TABS: 2.5000 mg | ORAL_TABLET | Freq: Two times a day (BID) | ORAL | 0 refills | Status: DC

## 2017-12-25 MED ORDER — RIVAROXABAN 20 MG PO TABS: 20.0000 mg | ORAL_TABLET | Freq: Every day | ORAL | 1 refills | Status: DC

## 2017-12-25 MED ORDER — RIVAROXABAN 2.5 MG PO TABS: 2.5000 mg | ORAL_TABLET | Freq: Two times a day (BID) | ORAL | 1 refills | Status: DC

## 2018-02-14 DIAGNOSIS — E119 Type 2 diabetes mellitus without complications: Secondary | ICD-10-CM | POA: Diagnosis not present

## 2018-02-14 DIAGNOSIS — R42 Dizziness and giddiness: Secondary | ICD-10-CM | POA: Diagnosis not present

## 2018-02-14 DIAGNOSIS — N400 Enlarged prostate without lower urinary tract symptoms: Secondary | ICD-10-CM | POA: Diagnosis not present

## 2018-02-14 DIAGNOSIS — R5383 Other fatigue: Secondary | ICD-10-CM | POA: Diagnosis not present

## 2018-02-14 DIAGNOSIS — E782 Mixed hyperlipidemia: Secondary | ICD-10-CM | POA: Diagnosis not present

## 2018-02-14 DIAGNOSIS — N410 Acute prostatitis: Secondary | ICD-10-CM | POA: Diagnosis not present

## 2018-02-14 DIAGNOSIS — I10 Essential (primary) hypertension: Secondary | ICD-10-CM | POA: Diagnosis not present

## 2018-02-18 DIAGNOSIS — M75111 Incomplete rotator cuff tear or rupture of right shoulder, not specified as traumatic: Secondary | ICD-10-CM | POA: Diagnosis not present

## 2018-02-18 DIAGNOSIS — Z23 Encounter for immunization: Secondary | ICD-10-CM | POA: Diagnosis not present

## 2018-02-18 DIAGNOSIS — Z6832 Body mass index (BMI) 32.0-32.9, adult: Secondary | ICD-10-CM | POA: Diagnosis not present

## 2018-02-18 DIAGNOSIS — I10 Essential (primary) hypertension: Secondary | ICD-10-CM | POA: Diagnosis not present

## 2018-02-18 DIAGNOSIS — Z86010 Personal history of colonic polyps: Secondary | ICD-10-CM | POA: Diagnosis not present

## 2018-02-18 DIAGNOSIS — E782 Mixed hyperlipidemia: Secondary | ICD-10-CM | POA: Diagnosis not present

## 2018-02-18 DIAGNOSIS — Z0001 Encounter for general adult medical examination with abnormal findings: Secondary | ICD-10-CM | POA: Diagnosis not present

## 2018-02-18 DIAGNOSIS — E119 Type 2 diabetes mellitus without complications: Secondary | ICD-10-CM | POA: Diagnosis not present

## 2018-08-10 ENCOUNTER — Other Ambulatory Visit: Payer: Self-pay | Admitting: Cardiovascular Disease

## 2018-08-12 ENCOUNTER — Encounter: Payer: Self-pay | Admitting: Cardiovascular Disease

## 2018-08-12 DIAGNOSIS — E782 Mixed hyperlipidemia: Secondary | ICD-10-CM | POA: Diagnosis not present

## 2018-08-12 DIAGNOSIS — E119 Type 2 diabetes mellitus without complications: Secondary | ICD-10-CM | POA: Diagnosis not present

## 2018-08-12 DIAGNOSIS — I10 Essential (primary) hypertension: Secondary | ICD-10-CM | POA: Diagnosis not present

## 2018-08-12 DIAGNOSIS — R5383 Other fatigue: Secondary | ICD-10-CM | POA: Diagnosis not present

## 2018-08-12 DIAGNOSIS — Z8572 Personal history of non-Hodgkin lymphomas: Secondary | ICD-10-CM | POA: Diagnosis not present

## 2018-08-14 DIAGNOSIS — Z8572 Personal history of non-Hodgkin lymphomas: Secondary | ICD-10-CM | POA: Diagnosis not present

## 2018-08-14 DIAGNOSIS — Z955 Presence of coronary angioplasty implant and graft: Secondary | ICD-10-CM | POA: Diagnosis not present

## 2018-08-14 DIAGNOSIS — I2510 Atherosclerotic heart disease of native coronary artery without angina pectoris: Secondary | ICD-10-CM | POA: Diagnosis not present

## 2018-08-14 DIAGNOSIS — E1169 Type 2 diabetes mellitus with other specified complication: Secondary | ICD-10-CM | POA: Diagnosis not present

## 2018-08-14 DIAGNOSIS — Z6833 Body mass index (BMI) 33.0-33.9, adult: Secondary | ICD-10-CM | POA: Diagnosis not present

## 2018-08-14 DIAGNOSIS — Z86010 Personal history of colonic polyps: Secondary | ICD-10-CM | POA: Diagnosis not present

## 2018-08-14 DIAGNOSIS — I10 Essential (primary) hypertension: Secondary | ICD-10-CM | POA: Diagnosis not present

## 2018-08-14 DIAGNOSIS — E782 Mixed hyperlipidemia: Secondary | ICD-10-CM | POA: Diagnosis not present

## 2018-08-28 ENCOUNTER — Telehealth: Payer: Self-pay | Admitting: *Deleted

## 2018-08-28 ENCOUNTER — Other Ambulatory Visit: Payer: Self-pay | Admitting: *Deleted

## 2018-08-28 ENCOUNTER — Encounter: Payer: Self-pay | Admitting: *Deleted

## 2018-08-28 MED ORDER — NITROGLYCERIN 0.4 MG SL SUBL: 0.4000 mg | SUBLINGUAL_TABLET | SUBLINGUAL | 3 refills | Status: DC | PRN

## 2018-08-28 NOTE — Telephone Encounter (Signed)
Contacted patient for review of chart for virtual visit (telephone) on 08/29/2018 with Dr. Bronson Ing at 8:20 am.  Medications reviewed.  No recent hospital visits.  Stated he had labs a few weeks ago, requested now from pmd.  Patient reminded to have weight and vitals ready before the phone call begins.    The patient verbally consented for a telehealth phone visit with [facility] and understands that his/her insurance company will be billed for the encounter.

## 2018-08-29 ENCOUNTER — Other Ambulatory Visit: Payer: Self-pay

## 2018-08-29 ENCOUNTER — Encounter: Payer: Self-pay | Admitting: Cardiovascular Disease

## 2018-08-29 ENCOUNTER — Telehealth (INDEPENDENT_AMBULATORY_CARE_PROVIDER_SITE_OTHER): Payer: Self-pay | Admitting: Cardiovascular Disease

## 2018-08-29 VITALS — BP 111/75 | HR 65 | Ht 71.0 in | Wt 232.0 lb

## 2018-08-29 DIAGNOSIS — I25708 Atherosclerosis of coronary artery bypass graft(s), unspecified, with other forms of angina pectoris: Secondary | ICD-10-CM

## 2018-08-29 DIAGNOSIS — E785 Hyperlipidemia, unspecified: Secondary | ICD-10-CM

## 2018-08-29 DIAGNOSIS — Z955 Presence of coronary angioplasty implant and graft: Secondary | ICD-10-CM

## 2018-08-29 DIAGNOSIS — I10 Essential (primary) hypertension: Secondary | ICD-10-CM

## 2018-08-29 DIAGNOSIS — Z951 Presence of aortocoronary bypass graft: Secondary | ICD-10-CM

## 2018-08-29 NOTE — Progress Notes (Signed)
Virtual Visit via Telephone Note   This visit type was conducted due to national recommendations for restrictions regarding the COVID-19 Pandemic (e.g. social distancing) in an effort to limit this patient's exposure and mitigate transmission in our community.  Due to his co-morbid illnesses, this patient is at least at moderate risk for complications without adequate follow up.  This format is felt to be most appropriate for this patient at this time.  The patient did not have access to video technology/had technical difficulties with video requiring transitioning to audio format only (telephone).  All issues noted in this document were discussed and addressed.  No physical exam could be performed with this format.  Please refer to the patient's chart for his  consent to telehealth for [facility].   Evaluation Performed:  Follow-up visit  Date:  08/29/2018   ID:  Ryan Vaughn, DOB February 01, 1951, MRN [id number]  Patient Location: Home  Provider Location: Office  PCP:  Curlene Labrum, MD  Cardiologist:  Bronson Ing Electrophysiologist:  None   Chief Complaint:  CAD  History of Present Illness:    Ryan Vaughn is a 68 y.o. male who presents via audio/video conferencing for a telehealth visit today.    He was hospitalized in February 2017 for unstable angina and underwent stenting of the mid RCA with overlapping drug-eluting stents on 07/16/15.  Echocardiogram at that time demonstrated normal left ventricular systolic function and regional wall motion, LVEF 55-60%, with mild to moderate tricuspid regurgitation.  He underwent unremarkable ABIs in May 2018.  He is about to go fishing in [place] [employer]. He takes care of his girlfriend who had a stroke some years back.  The patient denies any symptoms of chest pain, palpitations, shortness of breath, lightheadedness, dizziness, leg swelling, orthopnea, PND, and syncope.  He has some leg cramps which occasionally bother him at night.   The patient does not have symptoms concerning for COVID-19 infection (fever, chills, cough, or new shortness of breath).   Soc Hx: His daughter, [name], is an Therapist, sports and works at [employer]. He has another daughter who is a respiratory therapist at [employer]. Another daughter is a stay at home mother. He takes care of his girlfriend who had a stroke some years back.    Past Medical History:  Diagnosis Date  . Arthritis   . CAD (coronary artery disease)    a. DES to OM 1 and DES mLCX (12/05/13) b. CABG x1  ('92) c. 6 stents placed in 2009? (pt hx)   . Essential hypertension, benign   . Kidney stones   . Lumbago   . Mixed hyperlipidemia   . Non Hodgkin's lymphoma ([place]) 2002   a. stage IV  . Personal history of colonic polyps    Past Surgical History:  Procedure Laterality Date  . CARDIAC CATHETERIZATION N/A 07/16/2015   Procedure: Left Heart Cath and Cors/Grafts Angiography;  Surgeon: Jettie Booze, MD;  Location: [place] [facility];  Service: Cardiovascular;  Laterality: N/A;  . CARDIAC CATHETERIZATION N/A 07/16/2015   Procedure: Coronary Stent Intervention;  Surgeon: Jettie Booze, MD;  Location: [place] [facility];  Service: Cardiovascular;  Laterality: N/A;  . CHOLECYSTECTOMY  08/2000  . CORONARY ANGIOPLASTY WITH STENT PLACEMENT  ~ 1999; ~ 2009 X 2; 12/05/2013   "1 +3 + 2 + 2"  . [place]   "CABG X1"  . CYSTOSCOPY W/ URETEROSCOPY W/ LITHOTRIPSY    . CYSTOSTOMY W/ STENT  INSERTION    . EXPLORATORY LAPAROTOMY WITH ABDOMINAL MASS EXCISION  08/2000   "looking for cancer"  . INGUINAL HERNIA REPAIR    . LEFT HEART CATHETERIZATION WITH CORONARY ANGIOGRAM N/A 12/05/2013   Procedure: LEFT HEART CATHETERIZATION WITH CORONARY ANGIOGRAM;  Surgeon: Blane Ohara, MD;  Location: [facility];  Service: Cardiovascular;  Laterality: N/A;  . PERCUTANEOUS CORONARY STENT INTERVENTION (PCI-S)  12/05/2013   Procedure: PERCUTANEOUS CORONARY STENT INTERVENTION  (PCI-S);  Surgeon: Blane Ohara, MD;  Location: [facility] CATH LAB;  Service: Cardiovascular;;  mid circumflex and prox OM2  . TONSILLECTOMY       Current Meds  Medication Sig  . aspirin 81 MG tablet Take 81 mg by mouth daily.  . cyclobenzaprine (FLEXERIL) 10 MG tablet Take 10 mg by mouth 3 (three) times daily as needed for muscle spasms.  . metoprolol succinate (TOPROL-XL) 50 MG 24 hr tablet Take 50 mg by mouth daily.  . Multiple Vitamin (MULTIVITAMIN) tablet Take 1 tablet by mouth daily.  . nitroGLYCERIN (NITROSTAT) 0.4 MG SL tablet Place 1 tablet (0.4 mg total) under the tongue every 5 (five) minutes x 3 doses as needed for chest pain.  . rosuvastatin (CRESTOR) 20 MG tablet Take 20 mg by mouth daily.  Alveda Reasons 2.5 MG TABS tablet TAKE 1 TABLET(2.5 MG) BY MOUTH TWICE DAILY     Allergies:   Ivp dye [iodinated diagnostic agents]   Social History   Tobacco Use  . Smoking status: Never Smoker  . Smokeless tobacco: Current User    Types: Snuff  . Tobacco comment: 12/05/2013 "use snuff q now and again"  Substance Use Topics  . Alcohol use: Yes    Alcohol/week: 0.0 standard drinks    Comment: 12/05/2013 "maybe 2 cans beer/yr"  . Drug use: No     Family Hx: The patient's family history includes Breast cancer in his mother; Colon cancer in his father.  ROS:   Please see the history of present illness.     All other systems reviewed and are negative.   Prior CV studies:   The following studies were reviewed today:  None today  Labs/Other Tests and Data Reviewed:    EKG: NA  Recent Labs: No results found for requested labs within last 8760 hours.   Recent Lipid Panel Lab Results  Component Value Date/Time   CHOL 148 07/15/2015 04:07 AM   TRIG 473 (H) 07/15/2015 04:07 AM   HDL 28 (L) 07/15/2015 04:07 AM   CHOLHDL 5.3 07/15/2015 04:07 AM   LDLCALC UNABLE TO CALCULATE IF TRIGLYCERIDE OVER 400 mg/dL 07/15/2015 04:07 AM    Wt Readings from Last 3 Encounters:  08/29/18 232  lb (105.2 kg)  08/24/17 234 lb (106.1 kg)  08/24/16 238 lb (108 kg)     Objective:    Vital Signs:  BP 111/75   Pulse 65   Ht 5\' 11"  (1.803 m)   Wt 232 lb (105.2 kg)   SpO2 94%   BMI 32.36 kg/m    Phone visit   ASSESSMENT & PLAN:    1. CAD s/p 1-vessel CABG s/p PCI of first OM and mid LCx and mid RCA PCI (06/2015):Symptomatically stable. Continue aspirin, metoprolol, Xarelto 2.5 mg twice daily, and Crestor.   2. Hypertension: Controlled on present therapy. No changes.  3. Hyperlipidemia:Lipids checked 08/12/18 (TC 144, TG 183, HDL 32, LDL 75). Continue Crestor.   COVID-19 Education: The signs and symptoms of COVID-19 were discussed with the patient and how to  seek care for testing (follow up with PCP or arrange E-visit).  The importance of social distancing was discussed today.  Time:   Today, I have spent 15 minutes with the patient with telehealth technology discussing the above problems.     Medication Adjustments/Labs and Tests Ordered: Current medicines are reviewed at length with the patient today.  Concerns regarding medicines are outlined above.  Tests Ordered: No orders of the defined types were placed in this encounter.  Medication Changes: No orders of the defined types were placed in this encounter.   Disposition:  Follow up in 1 year(s)  Signed, Kate Sable, MD  08/29/2018 8:25 AM    [facility]

## 2018-08-29 NOTE — Patient Instructions (Signed)

## 2018-11-07 ENCOUNTER — Other Ambulatory Visit: Payer: Self-pay | Admitting: Cardiovascular Disease

## 2018-11-08 NOTE — Telephone Encounter (Addendum)
Prescription refill request for Xarelto received.   Last office visit: Dr. Bronson Ing (08/29/2018) Weight: 105.2 kg (per Dr. Court Joy note) Age: 68 yrs old Scr: 1.0 ( KPN 08/12/2018) CrCl:108 ml/min   Verified Xarelto dose 2.5mg  BID with pharm D. Will refill prescription refill request.

## 2018-12-15 ENCOUNTER — Emergency Department (HOSPITAL_COMMUNITY): Payer: Medicare Other

## 2018-12-15 ENCOUNTER — Observation Stay (HOSPITAL_COMMUNITY)
Admission: EM | Admit: 2018-12-15 | Discharge: 2018-12-17 | Disposition: A | Discharge status: Home / Self Care | Payer: Medicare Other | Attending: Cardiology | Admitting: Cardiology

## 2018-12-15 ENCOUNTER — Other Ambulatory Visit: Payer: Self-pay

## 2018-12-15 ENCOUNTER — Encounter (HOSPITAL_COMMUNITY): Payer: Self-pay

## 2018-12-15 DIAGNOSIS — R0789 Other chest pain: Secondary | ICD-10-CM | POA: Diagnosis present

## 2018-12-15 DIAGNOSIS — R079 Chest pain, unspecified: Secondary | ICD-10-CM | POA: Diagnosis present

## 2018-12-15 DIAGNOSIS — Z951 Presence of aortocoronary bypass graft: Secondary | ICD-10-CM | POA: Insufficient documentation

## 2018-12-15 DIAGNOSIS — Z7982 Long term (current) use of aspirin: Secondary | ICD-10-CM | POA: Insufficient documentation

## 2018-12-15 DIAGNOSIS — I10 Essential (primary) hypertension: Secondary | ICD-10-CM | POA: Diagnosis not present

## 2018-12-15 DIAGNOSIS — Z7984 Long term (current) use of oral hypoglycemic drugs: Secondary | ICD-10-CM | POA: Diagnosis not present

## 2018-12-15 DIAGNOSIS — Z79899 Other long term (current) drug therapy: Secondary | ICD-10-CM | POA: Diagnosis not present

## 2018-12-15 DIAGNOSIS — M1990 Unspecified osteoarthritis, unspecified site: Secondary | ICD-10-CM | POA: Insufficient documentation

## 2018-12-15 DIAGNOSIS — Z8572 Personal history of non-Hodgkin lymphomas: Secondary | ICD-10-CM | POA: Diagnosis not present

## 2018-12-15 DIAGNOSIS — E119 Type 2 diabetes mellitus without complications: Secondary | ICD-10-CM | POA: Insufficient documentation

## 2018-12-15 DIAGNOSIS — Z1159 Encounter for screening for other viral diseases: Secondary | ICD-10-CM | POA: Diagnosis not present

## 2018-12-15 DIAGNOSIS — I200 Unstable angina: Secondary | ICD-10-CM

## 2018-12-15 DIAGNOSIS — E782 Mixed hyperlipidemia: Secondary | ICD-10-CM | POA: Diagnosis not present

## 2018-12-15 DIAGNOSIS — I25110 Atherosclerotic heart disease of native coronary artery with unstable angina pectoris: Principal | ICD-10-CM | POA: Insufficient documentation

## 2018-12-15 DIAGNOSIS — I2582 Chronic total occlusion of coronary artery: Secondary | ICD-10-CM | POA: Diagnosis not present

## 2018-12-15 DIAGNOSIS — Z7901 Long term (current) use of anticoagulants: Secondary | ICD-10-CM | POA: Insufficient documentation

## 2018-12-15 DIAGNOSIS — Z20828 Contact with and (suspected) exposure to other viral communicable diseases: Secondary | ICD-10-CM | POA: Diagnosis not present

## 2018-12-15 LAB — COMPREHENSIVE METABOLIC PANEL
ALT: 40 U/L (ref 0–44)
AST: 36 U/L (ref 15–41)
Albumin: 4.1 g/dL (ref 3.5–5.0)
Alkaline Phosphatase: 71 U/L (ref 38–126)
Anion gap: 10 (ref 5–15)
BUN: 11 mg/dL (ref 8–23)
CO2: 23 mmol/L (ref 22–32)
Calcium: 9 mg/dL (ref 8.9–10.3)
Chloride: 104 mmol/L (ref 98–111)
Creatinine, Ser: 1.07 mg/dL (ref 0.61–1.24)
GFR calc Af Amer: >60 mL/min (ref >60)
GFR calc non Af Amer: >60 mL/min (ref >60)
Glucose, Bld: 137 mg/dL — ABNORMAL HIGH (ref 70–99)
Potassium: 4 mmol/L (ref 3.5–5.1)
Sodium: 137 mmol/L (ref 135–145)
Total Bilirubin: 0.7 mg/dL (ref 0.3–1.2)
Total Protein: 7 g/dL (ref 6.5–8.1)

## 2018-12-15 LAB — CBC WITH DIFFERENTIAL/PLATELET
Abs Immature Granulocytes: 0.03 10*3/uL (ref 0.00–0.07)
Basophils Absolute: 0.1 10*3/uL (ref 0.0–0.1)
Basophils Relative: 1 %
Eosinophils Absolute: 0.2 10*3/uL (ref 0.0–0.5)
Eosinophils Relative: 4 %
HCT: 45.7 % (ref 39.0–52.0)
Hemoglobin: 15.5 g/dL (ref 13.0–17.0)
Immature Granulocytes: 1 %
Lymphocytes Relative: 30 %
Lymphs Abs: 1.8 10*3/uL (ref 0.7–4.0)
MCH: 31 pg (ref 26.0–34.0)
MCHC: 33.9 g/dL (ref 30.0–36.0)
MCV: 91.4 fL (ref 80.0–100.0)
Monocytes Absolute: 0.5 10*3/uL (ref 0.1–1.0)
Monocytes Relative: 9 %
Neutro Abs: 3.3 10*3/uL (ref 1.7–7.7)
Neutrophils Relative %: 55 %
Platelets: 179 10*3/uL (ref 150–400)
RBC: 5 MIL/uL (ref 4.22–5.81)
RDW: 12.8 % (ref 11.5–15.5)
WBC: 6 10*3/uL (ref 4.0–10.5)
nRBC: 0 % (ref 0.0–0.2)

## 2018-12-15 LAB — GLUCOSE, CAPILLARY: Glucose-Capillary: 175 mg/dL — ABNORMAL HIGH (ref 70–99)

## 2018-12-15 LAB — TROPONIN I (HIGH SENSITIVITY)
Troponin I (High Sensitivity): 7 ng/L (ref <18)
Troponin I (High Sensitivity): 7 ng/L (ref <18)

## 2018-12-15 LAB — SARS CORONAVIRUS 2 BY RT PCR (HOSPITAL ORDER, PERFORMED IN ~~LOC~~ HOSPITAL LAB): SARS Coronavirus 2: NEGATIVE

## 2018-12-15 MED ORDER — ASPIRIN 81 MG PO CHEW
243.0000 mg | CHEWABLE_TABLET | Freq: Once | ORAL | Status: AC
  Administered 2018-12-15: 243 mg via ORAL
  Filled 2018-12-15: qty 3

## 2018-12-15 MED ORDER — NITROGLYCERIN IN D5W 200-5 MCG/ML-% IV SOLN
INTRAVENOUS | Status: AC
  Filled 2018-12-15: qty 250

## 2018-12-15 MED ORDER — METOPROLOL SUCCINATE ER 50 MG PO TB24
50.0000 mg | ORAL_TABLET | Freq: Every day | ORAL | Status: DC
  Administered 2018-12-16 – 2018-12-17 (×2): 50 mg via ORAL
  Filled 2018-12-15 (×2): qty 1

## 2018-12-15 MED ORDER — NITROGLYCERIN 0.4 MG SL SUBL
0.4000 mg | SUBLINGUAL_TABLET | SUBLINGUAL | Status: AC | PRN
  Administered 2018-12-15 (×3): 0.4 mg via SUBLINGUAL
  Filled 2018-12-15 (×2): qty 1

## 2018-12-15 MED ORDER — NITROGLYCERIN 0.4 MG SL SUBL
0.4000 mg | SUBLINGUAL_TABLET | SUBLINGUAL | Status: DC | PRN
  Administered 2018-12-15 (×2): 0.4 mg via SUBLINGUAL
  Filled 2018-12-15: qty 1

## 2018-12-15 MED ORDER — ASPIRIN 81 MG PO TABS: 81.0000 mg | ORAL_TABLET | Freq: Every day | ORAL | Status: DC

## 2018-12-15 MED ORDER — ONDANSETRON HCL 4 MG/2ML IJ SOLN: 4.0000 mg | Freq: Four times a day (QID) | INTRAMUSCULAR | Status: DC | PRN

## 2018-12-15 MED ORDER — ROSUVASTATIN CALCIUM 20 MG PO TABS
20.0000 mg | ORAL_TABLET | Freq: Every day | ORAL | Status: DC
  Administered 2018-12-16 – 2018-12-17 (×2): 20 mg via ORAL
  Filled 2018-12-15 (×2): qty 1

## 2018-12-15 MED ORDER — NITROGLYCERIN IN D5W 200-5 MCG/ML-% IV SOLN
2.0000 ug/min | INTRAVENOUS | Status: DC
  Administered 2018-12-15: 5 ug/min via INTRAVENOUS

## 2018-12-15 MED ORDER — ASPIRIN EC 81 MG PO TBEC
81.0000 mg | DELAYED_RELEASE_TABLET | Freq: Every day | ORAL | Status: DC
  Administered 2018-12-16 – 2018-12-17 (×2): 81 mg via ORAL
  Filled 2018-12-15 (×2): qty 1

## 2018-12-15 MED ORDER — SODIUM CHLORIDE 0.9 % IV SOLN
INTRAVENOUS | Status: DC
  Administered 2018-12-15: 10 mL/h via INTRAVENOUS

## 2018-12-15 MED ORDER — NITROGLYCERIN 0.4 MG SL SUBL: 0.4000 mg | SUBLINGUAL_TABLET | SUBLINGUAL | Status: DC | PRN

## 2018-12-15 MED ORDER — ACETAMINOPHEN 325 MG PO TABS
650.0000 mg | ORAL_TABLET | ORAL | Status: DC | PRN
  Administered 2018-12-16 (×3): 650 mg via ORAL
  Filled 2018-12-15 (×3): qty 2

## 2018-12-15 NOTE — Progress Notes (Signed)
Pt arrived to 6E04, no acute distress, VSS.  Pt c/o 7/10.  No other c/o.  After 2 SL NTG, pt still reports CP 6/10.  No acute EKG changes noted.  Cards notified.  Orders received.  Will cont to monitor closely.

## 2018-12-15 NOTE — H&P (Signed)
Cardiology Consultation:   Patient ID: Ryan Vaughn; [id number]; Mar 01, 1951   Admit date: 12/15/2018 Date of Consult: 12/15/2018  Primary Care Provider: Curlene Labrum, MD Primary Cardiologist: No primary care provider on file. Primary Electrophysiologist:  None  Chief Complaint: chest pain  Patient Profile:   Ryan Vaughn is a 68 y.o. male with a hx of CAD s/p CABGx1 (LIMA to LAD) in 1992, PCI to LCX/OM in 2009, and PCI to mid RCA in 2017 for unstable angina who presents with chest pain.  History of Present Illness:   Patient reports chest pain for the past 2 days.  The pain is sharp and located in the left chest and his left arm.  It happens randomly and is not clearly associated with exertion nor relieved with rest.  It typically lasts for seconds to minutes.  He states that "something is not right" and he typically does not have pain like this.  The pain is similar to his prior anginal equivalent.  Patient presented to the ED for further evaluation.  On and was negative and ECG unchanged from prior.  Patient was admitted for further evaluation.  He denies any flulike symptoms, dyspnea, fever, lower extremity edema.  Past Medical History:  Diagnosis Date   Arthritis    CAD (coronary artery disease)    a. DES to OM 1 and DES mLCX (12/05/13) b. CABG x1  ('92) c. 6 stents placed in 2009? (pt hx)    Essential hypertension, benign    Kidney stones    Lumbago    Mixed hyperlipidemia    Non Hodgkin's lymphoma ([place]) 2002   a. stage IV   Personal history of colonic polyps     Past Surgical History:  Procedure Laterality Date   CARDIAC CATHETERIZATION N/A 07/16/2015   Procedure: Left Heart Cath and Cors/Grafts Angiography;  Surgeon: Jettie Booze, MD;  Location: [place] [facility];  Service: Cardiovascular;  Laterality: N/A;   CARDIAC CATHETERIZATION N/A 07/16/2015   Procedure: Coronary Stent Intervention;  Surgeon: Jettie Booze, MD;  Location: [place] [facility];  Service: Cardiovascular;  Laterality: N/A;   CHOLECYSTECTOMY  08/2000   CORONARY ANGIOPLASTY WITH STENT PLACEMENT  ~ 1999; ~ 2009 X 2; 12/05/2013   "1 +3 + 2 + 2"   CORONARY ARTERY BYPASS GRAFT  1992   "CABG X1"   CYSTOSCOPY W/ URETEROSCOPY W/ LITHOTRIPSY     CYSTOSTOMY W/ STENT INSERTION     EXPLORATORY LAPAROTOMY WITH ABDOMINAL MASS EXCISION  08/2000   "looking for cancer"   INGUINAL HERNIA REPAIR     LEFT HEART CATHETERIZATION WITH CORONARY ANGIOGRAM N/A 12/05/2013   Procedure: LEFT HEART CATHETERIZATION WITH CORONARY ANGIOGRAM;  Surgeon: Blane Ohara, MD;  Location: [facility] CATH LAB;  Service: Cardiovascular;  Laterality: N/A;   PERCUTANEOUS CORONARY STENT INTERVENTION (PCI-S)  12/05/2013   Procedure: PERCUTANEOUS CORONARY STENT INTERVENTION (PCI-S);  Surgeon: Blane Ohara, MD;  Location: [facility] CATH LAB;  Service: Cardiovascular;;  mid circumflex and prox OM2   TONSILLECTOMY       Home Meds: Prior to Admission medications   Medication Sig Start Date End Date Taking? Authorizing Provider  aspirin 81 MG tablet Take 81 mg by mouth daily.    [provider]  cyclobenzaprine (FLEXERIL) 10 MG tablet Take 10 mg by mouth 3 (three) times daily as needed for muscle spasms.    [provider]  metoprolol succinate (TOPROL-XL) 50 MG 24 hr tablet Take 50 mg by  mouth daily.    [provider]  Multiple Vitamin (MULTIVITAMIN) tablet Take 1 tablet by mouth daily.    [provider]  nitroGLYCERIN (NITROSTAT) 0.4 MG SL tablet Place 1 tablet (0.4 mg total) under the tongue every 5 (five) minutes x 3 doses as needed for chest pain. 08/28/18   Herminio Commons, MD  rosuvastatin (CRESTOR) 20 MG tablet Take 20 mg by mouth daily.    [provider]  XARELTO 2.5 MG TABS tablet TAKE 1 TABLET BY MOUTH TWICE DAILY 11/08/18   Herminio Commons, MD    Allergies:    Allergies  Allergen Reactions   Ivp Dye [Iodinated Diagnostic Agents]  Other (See Comments)    headache    Social History:   Social History   Socioeconomic History   Marital status: Single    Spouse name: Not on file   Number of children: Not on file   Years of education: Not on file   Highest education level: Not on file  Occupational History   Not on file  Social Needs   Financial resource strain: Not on file   Food insecurity    Worry: Not on file    Inability: Not on file   Transportation needs    Medical: Not on file    Non-medical: Not on file  Tobacco Use   Smoking status: Never Smoker   Smokeless tobacco: Current User    Types: Snuff   Tobacco comment: 12/05/2013 "use snuff q now and again"  Substance and Sexual Activity   Alcohol use: Yes    Alcohol/week: 0.0 standard drinks    Comment: 12/05/2013 "maybe 2 cans beer/yr"   Drug use: No   Sexual activity: Yes  Lifestyle   Physical activity    Days per week: Not on file    Minutes per session: Not on file   Stress: Not on file  Relationships   Social connections    Talks on phone: Not on file    Gets together: Not on file    Attends religious service: Not on file    Active member of club or organization: Not on file    Attends meetings of clubs or organizations: Not on file    Relationship status: Not on file   Intimate partner violence    Fear of current or ex partner: Not on file    Emotionally abused: Not on file    Physically abused: Not on file    Forced sexual activity: Not on file  Other Topics Concern   Not on file  Social History Narrative   Not on file     Family History:    Family History  Problem Relation Age of Onset   Breast cancer Mother    Colon cancer Father       ROS:  Please see the history of present illness.  All other ROS reviewed and negative.     Physical Exam/Data:   Vitals:   12/15/18 1530 12/15/18 1600 12/15/18 1615 12/15/18 1645  BP: 135/88 115/83 102/72 109/74  Pulse: 66 66 74 68  Resp: (!) 23 (!) 23 17 11    Temp:      TempSrc:      SpO2: 96% 96% 92% 96%   No intake or output data in the 24 hours ending 12/15/18 1710 Last 3 Weights 08/29/2018 08/24/2017 08/24/2016  Weight (lbs) 232 lb 234 lb 238 lb  Weight (kg) 105.235 kg 106.142 kg 107.956 kg  There is no height or weight on file to calculate BMI.  General: Well developed, well nourished, in no acute distress. Head: Normocephalic, atraumatic, sclera non-icteric, no xanthomas, nares are without discharge.  Neck: Negative for carotid bruits. JVD not elevated. Lungs: Clear bilaterally to auscultation without wheezes, rales, or rhonchi. Breathing is unlabored. Heart: RRR with S1 S2. No murmurs, rubs, or gallops appreciated. Abdomen: Soft, non-tender, non-distended with normoactive bowel sounds. No hepatomegaly. No rebound/guarding. No obvious abdominal masses. Msk:  Strength and tone appear normal for age. Extremities: No clubbing or cyanosis. No edema.  Distal pedal pulses are 2+ and equal bilaterally. Neuro: Alert and oriented X 3. No facial asymmetry. No focal deficit. Moves all extremities spontaneously. Psych:  Responds to questions appropriately with a normal affect.   EKG:  The EKG was personally reviewed and demonstrates sinus rhythm, left axis deviation, normal ST segments  Relevant CV Studies: 06/2015  Patent stents in the circumflex and obtuse marginal.  Ost LAD to Prox LAD lesion, 100% stenosed. The LIMA to LAD is patent.  Mid RCA lesion, 75% stenosed, moderate disease extending into the proximal.. Post intervention with overlapping drug-eluting stents, postdilated to 3.5 mm, there is a 0% residual stenosis.  Normal LVEDP. Since the patient had an echocardiogram, we did not do a ventriculogram.    Laboratory Data:  High Sensitivity Troponin:   Recent Labs  Lab 12/15/18 1525  TROPONINIHS 7     Cardiac EnzymesNo results for input(s): TROPONINI in the last 168 hours. No results for input(s): TROPIPOC in the last 168  hours.  Chemistry Recent Labs  Lab 12/15/18 1525  NA 137  K 4.0  CL 104  CO2 23  GLUCOSE 137*  BUN 11  CREATININE 1.07  CALCIUM 9.0  GFRNONAA >60  GFRAA >60  ANIONGAP 10    Recent Labs  Lab 12/15/18 1525  PROT 7.0  ALBUMIN 4.1  AST 36  ALT 40  ALKPHOS 71  BILITOT 0.7   Hematology Recent Labs  Lab 12/15/18 1525  WBC 6.0  RBC 5.00  HGB 15.5  HCT 45.7  MCV 91.4  MCH 31.0  MCHC 33.9  RDW 12.8  PLT 179   BNPNo results for input(s): BNP, PROBNP in the last 168 hours.  DDimer No results for input(s): DDIMER in the last 168 hours.   Radiology/Studies:  Dg Chest 2 View  Result Date: 12/15/2018 CLINICAL DATA:  Chest pain today, history coronary artery disease post open heart surgery 27 years ago, hypertension EXAM: CHEST - 2 VIEW COMPARISON:  07/14/2015 FINDINGS: Upper normal size of cardiac silhouette post median sternotomy. Mediastinal contours and pulmonary vascularity normal. Lungs clear. Eventration of posterior LEFT diaphragm noted. No pleural effusion or pneumothorax. Bones unremarkable. IMPRESSION: No acute abnormalities. Electronically Signed   By: Lavonia Dana M.D.   On: 12/15/2018 15:56    Assessment and Plan:   1. Unstable angina New onset worsening chest pain with normal troponin and unremarkable EKG.  Patient symptoms are atypical in that they are sharp, not provoked by exertion, and last only seconds.  However he states they are similar to his prior anginal equivalent, and he has many risk factors including significant CAD in the past requiring intervention.  Given his risk factors and the similarity of his symptoms to his prior anginal symptoms, I think he warrants an admission for either stress testing or cardiac catheterization for further evaluation. -- NPO at midnight -- echocardiogram ordered -- hold low dose rivaroxaban -- continue BB, aspirin, statin  Liliane Bade, MD  12/15/2018 5:10 PM

## 2018-12-15 NOTE — ED Triage Notes (Signed)
Pt has 4/10 pain in left chest/arm.

## 2018-12-15 NOTE — ED Notes (Signed)
ED TO INPATIENT HANDOFF REPORT  ED Nurse Name and Phone #:  Patty 68  S Name/Age/Gender Ryan Vaughn 68 y.o. male Room/Bed: 546E/703J  Code Status   Code Status: Prior  Home/SNF/Other Home Patient oriented to: self, place, time and situation Is this baseline? Yes   Triage Complete: Triage complete  Chief Complaint left arm and chest pain  Triage Note Pt has 4/10 pain in left chest/arm.   Allergies Allergies  Allergen Reactions  . Ivp Dye [Iodinated Diagnostic Agents] Other (See Comments)    headache    Level of Care/Admitting Diagnosis ED Disposition    ED Disposition Condition Comment   Admit  Hospital Area: [place] [[id number]]  Level of Care: Telemetry Cardiac [103]  Covid Evaluation: Person Under Investigation (PUI)  Diagnosis: Chest pain [009381]  Admitting Physician: Bronson Ing  Attending Physician: Leonie Man [[id number]  PT Class (Do Not Modify): Observation [104]  PT Acc Code (Do Not Modify): Observation [10022]       B Medical/Surgery History Past Medical History:  Diagnosis Date  . Arthritis   . CAD (coronary artery disease)    a. DES to OM 1 and DES mLCX (12/05/13) b. CABG x1  ('92) c. 6 stents placed in 2009? (pt hx)   . Essential hypertension, benign   . Kidney stones   . Lumbago   . Mixed hyperlipidemia   . Non Hodgkin's lymphoma ([place]) 2002   a. stage IV  . Personal history of colonic polyps    Past Surgical History:  Procedure Laterality Date  . CARDIAC CATHETERIZATION N/A 07/16/2015   Procedure: Left Heart Cath and Cors/Grafts Angiography;  Surgeon: Jettie Booze, MD;  Location: [place] [facility];  Service: Cardiovascular;  Laterality: N/A;  . CARDIAC CATHETERIZATION N/A 07/16/2015   Procedure: Coronary Stent Intervention;  Surgeon: Jettie Booze, MD;  Location: [place] [facility];  Service: Cardiovascular;  Laterality: N/A;  . CHOLECYSTECTOMY  08/2000  . CORONARY ANGIOPLASTY WITH  STENT PLACEMENT  ~ 1999; ~ 2009 X 2; 12/05/2013   "1 +3 + 2 + 2"  . [place]   "CABG X1"  . CYSTOSCOPY W/ URETEROSCOPY W/ LITHOTRIPSY    . CYSTOSTOMY W/ STENT INSERTION    . EXPLORATORY LAPAROTOMY WITH ABDOMINAL MASS EXCISION  08/2000   "looking for cancer"  . INGUINAL HERNIA REPAIR    . LEFT HEART CATHETERIZATION WITH CORONARY ANGIOGRAM N/A 12/05/2013   Procedure: LEFT HEART CATHETERIZATION WITH CORONARY ANGIOGRAM;  Surgeon: Blane Ohara, MD;  Location: [facility] CATH LAB;  Service: Cardiovascular;  Laterality: N/A;  . PERCUTANEOUS CORONARY STENT INTERVENTION (PCI-S)  12/05/2013   Procedure: PERCUTANEOUS CORONARY STENT INTERVENTION (PCI-S);  Surgeon: Blane Ohara, MD;  Location: [facility] CATH LAB;  Service: Cardiovascular;;  mid circumflex and prox OM2  . TONSILLECTOMY       A IV Location/Drains/Wounds Patient Lines/Drains/Airways Status   Active Line/Drains/Airways    Name:   Placement date:   Placement time:   Site:   Days:   Peripheral IV 07/14/15 Right Forearm   07/14/15    1645    Forearm   1250   Peripheral IV 07/16/15 Left;Posterior Forearm   07/16/15    0840    Forearm   1248          Intake/Output Last 24 hours No intake or output data in the 24 hours ending 12/15/18 2041  Labs/Imaging Results for orders placed or performed during  the hospital encounter of 12/15/18 (from the past 48 hour(s))  CBC with Differential/Platelet     Status: None   Collection Time: 12/15/18  3:25 PM  Result Value Ref Range   WBC 6.0 4.0 - 10.5 K/uL   RBC 5.00 4.22 - 5.81 MIL/uL   Hemoglobin 15.5 13.0 - 17.0 g/dL   HCT 45.7 39.0 - 52.0 %   MCV 91.4 80.0 - 100.0 fL   MCH 31.0 26.0 - 34.0 pg   MCHC 33.9 30.0 - 36.0 g/dL   RDW 12.8 11.5 - 15.5 %   Platelets 179 150 - 400 K/uL   nRBC 0.0 0.0 - 0.2 %   Neutrophils Relative % 55 %   Neutro Abs 3.3 1.7 - 7.7 K/uL   Lymphocytes Relative 30 %   Lymphs Abs 1.8 0.7 - 4.0 K/uL   Monocytes Relative 9 %   Monocytes Absolute 0.5  0.1 - 1.0 K/uL   Eosinophils Relative 4 %   Eosinophils Absolute 0.2 0.0 - 0.5 K/uL   Basophils Relative 1 %   Basophils Absolute 0.1 0.0 - 0.1 K/uL   Immature Granulocytes 1 %   Abs Immature Granulocytes 0.03 0.00 - 0.07 K/uL    Comment: Performed at [facility], [address], [place], [place] [address]  Comprehensive metabolic panel     Status: Abnormal   Collection Time: 12/15/18  3:25 PM  Result Value Ref Range   Sodium 137 135 - 145 mmol/L   Potassium 4.0 3.5 - 5.1 mmol/L   Chloride 104 98 - 111 mmol/L   CO2 23 22 - 32 mmol/L   Glucose, Bld 137 (H) 70 - 99 mg/dL   BUN 11 8 - 23 mg/dL   Creatinine, Ser 1.07 0.61 - 1.24 mg/dL   Calcium 9.0 8.9 - 10.3 mg/dL   Total Protein 7.0 6.5 - 8.1 g/dL   Albumin 4.1 3.5 - 5.0 g/dL   AST 36 15 - 41 U/L   ALT 40 0 - 44 U/L   Alkaline Phosphatase 71 38 - 126 U/L   Total Bilirubin 0.7 0.3 - 1.2 mg/dL   GFR calc non Af Amer >60 >60 mL/min   GFR calc Af Amer >60 >60 mL/min   Anion gap 10 5 - 15    Comment: Performed at [facility], [place] [address], [place], [place] [address]  Troponin I (High Sensitivity)     Status: None   Collection Time: 12/15/18  3:25 PM  Result Value Ref Range   Troponin I (High Sensitivity) 7 <18 ng/L    Comment: (NOTE) Elevated high sensitivity troponin I (hsTnI) values and significant  changes across serial measurements may suggest ACS but many other  chronic and acute conditions are known to elevate hsTnI results.  Refer to the "Links" section for chest pain algorithms and additional  guidance. Performed at [facility], [place] [address] [place], [place] [address]   Troponin I (High Sensitivity)     Status: None   Collection Time: 12/15/18  5:37 PM  Result Value Ref Range   Troponin I (High Sensitivity) 7 <18 ng/L    Comment: (NOTE) Elevated high sensitivity troponin I (hsTnI) values and significant  changes across serial measurements may suggest ACS but many other  chronic and acute  conditions are known to elevate hsTnI results.  Refer to the "Links" section for chest pain algorithms and additional  guidance. Performed at [facility], [place],  [place] [address]    Dg Chest 2 View  Result Date: 12/15/2018 CLINICAL DATA:  Chest pain today, history coronary artery disease post open heart surgery 27 years ago, hypertension EXAM: CHEST - 2 VIEW COMPARISON:  07/14/2015 FINDINGS: Upper normal size of cardiac silhouette post median sternotomy. Mediastinal contours and pulmonary vascularity normal. Lungs clear. Eventration of posterior LEFT diaphragm noted. No pleural effusion or pneumothorax. Bones unremarkable. IMPRESSION: No acute abnormalities. Electronically Signed   By: Lavonia Dana M.D.   On: 12/15/2018 15:56    Pending Labs Unresulted Labs (From admission, onward)    Start     Ordered   12/15/18 1914  SARS Coronavirus 2 (CEPHEID - Performed in [place] hospital lab), [facility]  Once,   STAT    Question:  Rule Out  Answer:  Yes   12/15/18 1913          Vitals/Pain Today's Vitals   12/15/18 1830 12/15/18 1845 12/15/18 1945 12/15/18 2015  BP: 105/69 (!) 127/93 131/88 118/85  Pulse: (!) 59 65 61 61  Resp: 11 19 19 15   Temp:      TempSrc:      SpO2: 97% 97% 96% 95%  PainSc:        Isolation Precautions No active isolations  Medications Medications  aspirin chewable tablet 243 mg (243 mg Oral Given 12/15/18 1604)  nitroGLYCERIN (NITROSTAT) SL tablet 0.4 mg (0.4 mg Sublingual Given 12/15/18 1614)    Mobility walks Low fall risk   Focused Assessments    R Recommendations: See Admitting Provider Note  Report given to:   Additional Notes:

## 2018-12-15 NOTE — ED Provider Notes (Signed)
[facility] EMERGENCY DEPARTMENT Provider Note   CSN: [id number] Arrival date & time: 12/15/18  1459    History   Chief Complaint Chief Complaint  Patient presents with  . Chest Pain    HPI Ryan Vaughn is a 68 y.o. male hx of CAD s/p 9 stents and CABG, HTN, non-Hodgkin's lymphoma here presenting with chest pain.  Patient has been having left-sided chest pain and pressure since yesterday .  He states that the pain is like a bee sting sensation and occasionally radiates down the left arm.  No associated shortness of breath.  Patient states that this is similar to his previous MIs.  Patient does follow-up with [place] [facility].  Patient states that pain is 5 out of 10 currently.      The history is provided by the patient.    Past Medical History:  Diagnosis Date  . Arthritis   . CAD (coronary artery disease)    a. DES to OM 1 and DES mLCX (12/05/13) b. CABG x1  ('92) c. 6 stents placed in 2009? (pt hx)   . Essential hypertension, benign   . Kidney stones   . Lumbago   . Mixed hyperlipidemia   . Non Hodgkin's lymphoma ([place]) 2002   a. stage IV  . Personal history of colonic polyps     Patient Active Problem List   Diagnosis Date Noted  . Chest pain 12/15/2018  . Coronary artery disease involving native coronary artery of native heart with unstable angina pectoris ([place])   . Unstable angina ([place]) 12/06/2013  . CAD (coronary artery disease)   . Non Hodgkin's lymphoma ([place])   . Arthritis   . Kidney stones   . S/P CABG (coronary artery bypass graft) 02/06/2013  . HTN (hypertension) 02/06/2013  . Hyperlipidemia 02/06/2013  . Colon polyps 02/06/2013    Past Surgical History:  Procedure Laterality Date  . CARDIAC CATHETERIZATION N/A 07/16/2015   Procedure: Left Heart Cath and Cors/Grafts Angiography;  Surgeon: Jettie Booze, MD;  Location: [place] [facility];  Service: Cardiovascular;  Laterality: N/A;  . CARDIAC CATHETERIZATION  N/A 07/16/2015   Procedure: Coronary Stent Intervention;  Surgeon: Jettie Booze, MD;  Location: [place] [facility];  Service: Cardiovascular;  Laterality: N/A;  . CHOLECYSTECTOMY  08/2000  . CORONARY ANGIOPLASTY WITH STENT PLACEMENT  ~ 1999; ~ 2009 X 2; 12/05/2013   "1 +3 + 2 + 2"  . [place]   "CABG X1"  . CYSTOSCOPY W/ URETEROSCOPY W/ LITHOTRIPSY    . CYSTOSTOMY W/ STENT INSERTION    . EXPLORATORY LAPAROTOMY WITH ABDOMINAL MASS EXCISION  08/2000   "looking for cancer"  . INGUINAL HERNIA REPAIR    . LEFT HEART CATHETERIZATION WITH CORONARY ANGIOGRAM N/A 12/05/2013   Procedure: LEFT HEART CATHETERIZATION WITH CORONARY ANGIOGRAM;  Surgeon: Blane Ohara, MD;  Location: [facility] CATH LAB;  Service: Cardiovascular;  Laterality: N/A;  . PERCUTANEOUS CORONARY STENT INTERVENTION (PCI-S)  12/05/2013   Procedure: PERCUTANEOUS CORONARY STENT INTERVENTION (PCI-S);  Surgeon: Blane Ohara, MD;  Location: [facility] CATH LAB;  Service: Cardiovascular;;  mid circumflex and prox OM2  . TONSILLECTOMY          Home Medications    Prior to Admission medications   Medication Sig Start Date End Date Taking? Authorizing Provider  aspirin 81 MG tablet Take 81 mg by mouth daily.    [provider]  cyclobenzaprine (FLEXERIL) 10 MG tablet Take  10 mg by mouth 3 (three) times daily as needed for muscle spasms.    [provider]  metoprolol succinate (TOPROL-XL) 50 MG 24 hr tablet Take 50 mg by mouth daily.    [provider]  Multiple Vitamin (MULTIVITAMIN) tablet Take 1 tablet by mouth daily.    [provider]  nitroGLYCERIN (NITROSTAT) 0.4 MG SL tablet Place 1 tablet (0.4 mg total) under the tongue every 5 (five) minutes x 3 doses as needed for chest pain. 08/28/18   Herminio Commons, MD  rosuvastatin (CRESTOR) 20 MG tablet Take 20 mg by mouth daily.    [provider]  XARELTO 2.5 MG TABS tablet TAKE 1 TABLET BY MOUTH TWICE DAILY 11/08/18    Herminio Commons, MD    Family History Family History  Problem Relation Age of Onset  . Breast cancer Mother   . Colon cancer Father     Social History Social History   Tobacco Use  . Smoking status: Never Smoker  . Smokeless tobacco: Current User    Types: Snuff  . Tobacco comment: 12/05/2013 "use snuff q now and again"  Substance Use Topics  . Alcohol use: Yes    Alcohol/week: 0.0 standard drinks    Comment: 12/05/2013 "maybe 2 cans beer/yr"  . Drug use: No     Allergies   Ivp dye [iodinated diagnostic agents]   Review of Systems Review of Systems  Cardiovascular: Positive for chest pain.  All other systems reviewed and are negative.    Physical Exam Updated Vital Signs BP 102/72   Pulse 67   Temp 98 F (36.7 C) (Oral)   Resp 15   SpO2 97%   Physical Exam Vitals signs and nursing note reviewed.  HENT:     Head: Normocephalic.     Right Ear: Tympanic membrane normal.     Left Ear: Tympanic membrane normal.     Nose: Nose normal.     Mouth/Throat:     Mouth: Mucous membranes are moist.  Eyes:     Extraocular Movements: Extraocular movements intact.     Pupils: Pupils are equal, round, and reactive to light.  Neck:     Musculoskeletal: Normal range of motion.  Cardiovascular:     Rate and Rhythm: Normal rate and regular rhythm.  Pulmonary:     Effort: Pulmonary effort is normal.     Breath sounds: Normal breath sounds.  Abdominal:     General: Abdomen is flat.     Palpations: Abdomen is soft.  Musculoskeletal: Normal range of motion.  Skin:    General: Skin is warm.     Capillary Refill: Capillary refill takes less than 2 seconds.  Neurological:     General: No focal deficit present.     Mental Status: He is alert and oriented to person, place, and time.  Psychiatric:        Mood and Affect: Mood normal.        Behavior: Behavior normal.      ED Treatments / Results  Labs (all labs ordered are listed, but only abnormal results are  displayed) Labs Reviewed  COMPREHENSIVE METABOLIC PANEL - Abnormal; Notable for the following components:      Result Value   Glucose, Bld 137 (*)    All other components within normal limits  CBC WITH DIFFERENTIAL/PLATELET  TROPONIN I (HIGH SENSITIVITY)  TROPONIN I (HIGH SENSITIVITY)    EKG EKG Interpretation  Date/Time:  Sunday December 15 2018 15:30:45 EDT Ventricular  Rate:  66 PR Interval:    QRS Duration: 101 QT Interval:  426 QTC Calculation: 447 R Axis:   -27 Text Interpretation:  Sinus rhythm Borderline left axis deviation RSR' in V1 or V2, right VCD or RVH No significant change since last tracing Confirmed by Wandra Arthurs [phone]) on 12/15/2018 3:33:13 PM   Radiology Dg Chest 2 View  Result Date: 12/15/2018 CLINICAL DATA:  Chest pain today, history coronary artery disease post open heart surgery 27 years ago, hypertension EXAM: CHEST - 2 VIEW COMPARISON:  07/14/2015 FINDINGS: Upper normal size of cardiac silhouette post median sternotomy. Mediastinal contours and pulmonary vascularity normal. Lungs clear. Eventration of posterior LEFT diaphragm noted. No pleural effusion or pneumothorax. Bones unremarkable. IMPRESSION: No acute abnormalities. Electronically Signed   By: Lavonia Dana M.D.   On: 12/15/2018 15:56    Procedures Procedures (including critical care time)  Medications Ordered in ED Medications  aspirin chewable tablet 243 mg (243 mg Oral Given 12/15/18 1604)  nitroGLYCERIN (NITROSTAT) SL tablet 0.4 mg (0.4 mg Sublingual Given 12/15/18 1614)     Initial Impression / Assessment and Plan / ED Course  I have reviewed the triage vital signs and the nursing notes.  Pertinent labs & imaging results that were available during my care of the patient were reviewed by me and considered in my medical decision making (see chart for details).       DIONISIOS RICCI is a 68 y.o. male here with chest pain. Has multiple stents already and symptoms similar to previous MI. High  risk for ACS. Will give ASA, nitro. Will get labs including troponin but patient likely needs admission   4:30 PM Initial troponin is negative. I talked to Dr. Ellyn Hack, who recommends second troponin and then consult if elevated.   7:11 PM Repeat Trop still the same. Cardiology did see patient and since he has multiple stents, will admit for r/o ACS. Hold off on heparin drip for now    Final Clinical Impressions(s) / ED Diagnoses   Final diagnoses:  Unstable angina [facility])    ED Discharge Orders    None       Drenda Freeze, MD 12/15/18 1911

## 2018-12-16 ENCOUNTER — Encounter (HOSPITAL_COMMUNITY)
Admission: EM | Disposition: A | Discharge status: Home / Self Care | Payer: Self-pay | Source: Home / Self Care | Attending: Emergency Medicine

## 2018-12-16 ENCOUNTER — Other Ambulatory Visit: Payer: Self-pay

## 2018-12-16 ENCOUNTER — Ambulatory Visit (HOSPITAL_BASED_OUTPATIENT_CLINIC_OR_DEPARTMENT_OTHER): Payer: Medicare Other

## 2018-12-16 ENCOUNTER — Encounter (HOSPITAL_COMMUNITY): Payer: Self-pay | Admitting: Cardiovascular Disease

## 2018-12-16 DIAGNOSIS — I2510 Atherosclerotic heart disease of native coronary artery without angina pectoris: Secondary | ICD-10-CM | POA: Diagnosis not present

## 2018-12-16 DIAGNOSIS — Z1159 Encounter for screening for other viral diseases: Secondary | ICD-10-CM | POA: Diagnosis not present

## 2018-12-16 DIAGNOSIS — I259 Chronic ischemic heart disease, unspecified: Secondary | ICD-10-CM

## 2018-12-16 DIAGNOSIS — I200 Unstable angina: Secondary | ICD-10-CM

## 2018-12-16 DIAGNOSIS — E119 Type 2 diabetes mellitus without complications: Secondary | ICD-10-CM

## 2018-12-16 DIAGNOSIS — I371 Nonrheumatic pulmonary valve insufficiency: Secondary | ICD-10-CM | POA: Diagnosis not present

## 2018-12-16 DIAGNOSIS — Z7982 Long term (current) use of aspirin: Secondary | ICD-10-CM | POA: Diagnosis not present

## 2018-12-16 DIAGNOSIS — I10 Essential (primary) hypertension: Secondary | ICD-10-CM | POA: Diagnosis not present

## 2018-12-16 DIAGNOSIS — I25110 Atherosclerotic heart disease of native coronary artery with unstable angina pectoris: Secondary | ICD-10-CM | POA: Diagnosis not present

## 2018-12-16 HISTORY — PX: LEFT HEART CATH AND CORS/GRAFTS ANGIOGRAPHY: CATH118250

## 2018-12-16 LAB — GLUCOSE, CAPILLARY
Glucose-Capillary: 124 mg/dL — ABNORMAL HIGH (ref 70–99)
Glucose-Capillary: 167 mg/dL — ABNORMAL HIGH (ref 70–99)
Glucose-Capillary: 255 mg/dL — ABNORMAL HIGH (ref 70–99)
Glucose-Capillary: 242 mg/dL — ABNORMAL HIGH (ref 70–99)

## 2018-12-16 LAB — TROPONIN I (HIGH SENSITIVITY)
Troponin I (High Sensitivity): 6 ng/L (ref <18)
Troponin I (High Sensitivity): 7 ng/L (ref <18)

## 2018-12-16 LAB — ECHOCARDIOGRAM COMPLETE
Height: 71 in
Weight: 3670.4 oz

## 2018-12-16 LAB — HEMOGLOBIN A1C
Hgb A1c MFr Bld: 6.8 % — ABNORMAL HIGH (ref 4.8–5.6)
Mean Plasma Glucose: 148.46 mg/dL

## 2018-12-16 SURGERY — LEFT HEART CATH AND CORS/GRAFTS ANGIOGRAPHY
Anesthesia: LOCAL

## 2018-12-16 MED ORDER — HEPARIN (PORCINE) IN NACL 1000-0.9 UT/500ML-% IV SOLN
INTRAVENOUS | Status: AC
  Filled 2018-12-16: qty 500

## 2018-12-16 MED ORDER — LABETALOL HCL 5 MG/ML IV SOLN: 10.0000 mg | INTRAVENOUS | Status: AC | PRN

## 2018-12-16 MED ORDER — SODIUM CHLORIDE 0.9 % WEIGHT BASED INFUSION
1.0000 mL/kg/h | INTRAVENOUS | Status: AC
  Administered 2018-12-16: 1 mL/kg/h via INTRAVENOUS

## 2018-12-16 MED ORDER — VERAPAMIL HCL 2.5 MG/ML IV SOLN
INTRAVENOUS | Status: DC | PRN
  Administered 2018-12-16: 10 mL via INTRA_ARTERIAL

## 2018-12-16 MED ORDER — METHYLPREDNISOLONE SODIUM SUCC 125 MG IJ SOLR
125.0000 mg | Freq: Once | INTRAMUSCULAR | Status: AC
  Administered 2018-12-16: 125 mg via INTRAVENOUS
  Filled 2018-12-16: qty 2

## 2018-12-16 MED ORDER — MIDAZOLAM HCL 2 MG/2ML IJ SOLN
INTRAMUSCULAR | Status: AC
  Filled 2018-12-16: qty 2

## 2018-12-16 MED ORDER — SODIUM CHLORIDE 0.9% FLUSH: 3.0000 mL | INTRAVENOUS | Status: DC | PRN

## 2018-12-16 MED ORDER — SODIUM CHLORIDE 0.9% FLUSH
3.0000 mL | Freq: Two times a day (BID) | INTRAVENOUS | Status: DC
  Administered 2018-12-16 – 2018-12-17 (×2): 3 mL via INTRAVENOUS

## 2018-12-16 MED ORDER — MORPHINE SULFATE (PF) 2 MG/ML IV SOLN
2.0000 mg | Freq: Once | INTRAVENOUS | Status: AC
  Administered 2018-12-16: 2 mg via INTRAVENOUS
  Filled 2018-12-16: qty 1

## 2018-12-16 MED ORDER — LIDOCAINE HCL (PF) 1 % IJ SOLN
INTRAMUSCULAR | Status: AC
  Filled 2018-12-16: qty 30

## 2018-12-16 MED ORDER — DIPHENHYDRAMINE HCL 50 MG/ML IJ SOLN
25.0000 mg | Freq: Once | INTRAMUSCULAR | Status: AC
  Administered 2018-12-16: 25 mg via INTRAVENOUS
  Filled 2018-12-16: qty 1

## 2018-12-16 MED ORDER — SODIUM CHLORIDE 0.9 % IV SOLN: 250.0000 mL | INTRAVENOUS | Status: DC | PRN

## 2018-12-16 MED ORDER — SODIUM CHLORIDE 0.9 % WEIGHT BASED INFUSION
1.0000 mL/kg/h | INTRAVENOUS | Status: DC
  Administered 2018-12-16: 1 mL/kg/h via INTRAVENOUS

## 2018-12-16 MED ORDER — HEPARIN SODIUM (PORCINE) 1000 UNIT/ML IJ SOLN
INTRAMUSCULAR | Status: DC | PRN
  Administered 2018-12-16: 5000 [IU] via INTRAVENOUS

## 2018-12-16 MED ORDER — VERAPAMIL HCL 2.5 MG/ML IV SOLN
INTRAVENOUS | Status: AC
  Filled 2018-12-16: qty 2

## 2018-12-16 MED ORDER — SODIUM CHLORIDE 0.9 % WEIGHT BASED INFUSION
3.0000 mL/kg/h | INTRAVENOUS | Status: AC
  Administered 2018-12-16: 3 mL/kg/h via INTRAVENOUS

## 2018-12-16 MED ORDER — IOHEXOL 350 MG/ML SOLN
INTRAVENOUS | Status: DC | PRN
  Administered 2018-12-16: 11:00:00 95 mL via INTRACARDIAC

## 2018-12-16 MED ORDER — MIDAZOLAM HCL 2 MG/2ML IJ SOLN
INTRAMUSCULAR | Status: DC | PRN
  Administered 2018-12-16 (×2): 1 mg via INTRAVENOUS

## 2018-12-16 MED ORDER — HEPARIN (PORCINE) IN NACL 1000-0.9 UT/500ML-% IV SOLN
INTRAVENOUS | Status: DC | PRN
  Administered 2018-12-16 (×2): 500 mL

## 2018-12-16 MED ORDER — FENTANYL CITRATE (PF) 100 MCG/2ML IJ SOLN
INTRAMUSCULAR | Status: DC | PRN
  Administered 2018-12-16 (×2): 25 ug via INTRAVENOUS

## 2018-12-16 MED ORDER — HYDRALAZINE HCL 20 MG/ML IJ SOLN: 10.0000 mg | INTRAMUSCULAR | Status: AC | PRN

## 2018-12-16 MED ORDER — HEPARIN SODIUM (PORCINE) 1000 UNIT/ML IJ SOLN
INTRAMUSCULAR | Status: AC
  Filled 2018-12-16: qty 1

## 2018-12-16 MED ORDER — LIDOCAINE HCL (PF) 1 % IJ SOLN
INTRAMUSCULAR | Status: DC | PRN
  Administered 2018-12-16: 2 mL

## 2018-12-16 MED ORDER — SODIUM CHLORIDE 0.9% FLUSH
3.0000 mL | Freq: Two times a day (BID) | INTRAVENOUS | Status: DC
  Administered 2018-12-16 – 2018-12-17 (×3): 3 mL via INTRAVENOUS

## 2018-12-16 MED ORDER — PERFLUTREN LIPID MICROSPHERE
1.0000 mL | INTRAVENOUS | Status: AC | PRN
  Administered 2018-12-16: 3 mL via INTRAVENOUS
  Filled 2018-12-16: qty 10

## 2018-12-16 MED ORDER — FENTANYL CITRATE (PF) 100 MCG/2ML IJ SOLN
INTRAMUSCULAR | Status: AC
  Filled 2018-12-16: qty 2

## 2018-12-16 MED ORDER — RIVAROXABAN 2.5 MG PO TABS
2.5000 mg | ORAL_TABLET | Freq: Two times a day (BID) | ORAL | Status: DC
  Administered 2018-12-17: 2.5 mg via ORAL
  Filled 2018-12-16: qty 1

## 2018-12-16 MED ORDER — INSULIN ASPART 100 UNIT/ML ~~LOC~~ SOLN
0.0000 [IU] | Freq: Three times a day (TID) | SUBCUTANEOUS | Status: DC
  Administered 2018-12-16: 3 [IU] via SUBCUTANEOUS
  Administered 2018-12-16: 8 [IU] via SUBCUTANEOUS
  Administered 2018-12-17: 5 [IU] via SUBCUTANEOUS

## 2018-12-16 SURGICAL SUPPLY — 13 items
BAND CMPR LRG ZPHR (HEMOSTASIS) ×1
BAND ZEPHYR COMPRESS 30 LONG (HEMOSTASIS) ×1 IMPLANT
CATH INFINITI 5 FR JL3.5 (CATHETERS) ×1 IMPLANT
CATH INFINITI 5FR MULTPACK ANG (CATHETERS) ×1 IMPLANT
COVER DOME SNAP 22 D (MISCELLANEOUS) ×1 IMPLANT
GUIDEWIRE INQWIRE 1.5J.035X260 (WIRE) IMPLANT
INQWIRE 1.5J .035X260CM (WIRE) ×2
KIT HEART LEFT (KITS) ×2 IMPLANT
PACK CARDIAC CATHETERIZATION (CUSTOM PROCEDURE TRAY) ×2 IMPLANT
SHEATH RAIN RADIAL 21G 6FR (SHEATH) ×1 IMPLANT
SYR MEDRAD MARK 7 150ML (SYRINGE) ×2 IMPLANT
TRANSDUCER W/STOPCOCK (MISCELLANEOUS) ×2 IMPLANT
TUBING CIL FLEX 10 FLL-RA (TUBING) ×2 IMPLANT

## 2018-12-16 NOTE — Care Management Obs Status (Signed)
[facility] NOTIFICATION   Patient Details  Name: Ryan Vaughn MRN: [id number] Date of Birth: 05/18/1951   Medicare Observation Status Notification Given:  Yes    Bethena Roys, RN 12/16/2018, 4:59 PM

## 2018-12-16 NOTE — Progress Notes (Signed)
Pt currently on 15 mcg IV nitroglycerin and states pain worse than before.  Pt states CP 7/10 and states he is unable to get comfortable.  SBP 100-102.  Cardiology notified, will continue to monitor patient closely.

## 2018-12-16 NOTE — Progress Notes (Signed)
Echocardiogram 2D Echocardiogram has been performed.  [name] 12/16/2018, 9:06 AM

## 2018-12-16 NOTE — H&P (View-Only) (Signed)
Progress Note  Patient Name: Ryan Vaughn Date of Encounter: 12/16/2018  Primary Cardiologist: No primary care provider on file.   Subjective   Still with ongoing chest pain despite nitro.   Inpatient Medications    Scheduled Meds: . aspirin EC  81 mg Oral Daily  . metoprolol succinate  50 mg Oral Daily  . rosuvastatin  20 mg Oral Daily   Continuous Infusions: . sodium chloride 10 mL/hr (12/15/18 2334)  . nitroGLYCERIN 15 mcg/min (12/16/18 0059)   PRN Meds: acetaminophen, nitroGLYCERIN, nitroGLYCERIN, ondansetron (ZOFRAN) IV   Vital Signs    Vitals:   12/16/18 0225 12/16/18 0325 12/16/18 0722 12/16/18 0800  BP: 110/77 108/74 112/81 121/82  Pulse:   (!) 59   Resp:   18   Temp:   97.6 F (36.4 C)   TempSrc:   Oral   SpO2:   100%   Weight:      Height:        Intake/Output Summary (Last 24 hours) at 12/16/2018 1245 Last data filed at 12/16/2018 0700 Gross per 24 hour  Intake 345 ml  Output 400 ml  Net -55 ml   Last 3 Weights 12/15/2018 08/29/2018 08/24/2017  Weight (lbs) 229 lb 6.4 oz 232 lb 234 lb  Weight (kg) 104.055 kg 105.235 kg 106.142 kg      Telemetry    SR - Personally Reviewed  ECG    SR rate 61 - Personally Reviewed  Physical Exam  Pleasant male, sitting up in bed. Echo at bedside. GEN: No acute distress.   Neck: No JVD Cardiac: RRR, no murmurs, rubs, or gallops.  Respiratory: Clear to auscultation bilaterally. GI: Soft, nontender, non-distended  MS: No edema; No deformity. Neuro:  Nonfocal  Psych: Normal affect   Labs    High Sensitivity Troponin:   Recent Labs  Lab 12/15/18 1525 12/15/18 1737 12/16/18 0319  TROPONINIHS 7 7 6       Cardiac EnzymesNo results for input(s): TROPONINI in the last 168 hours. No results for input(s): TROPIPOC in the last 168 hours.   Chemistry Recent Labs  Lab 12/15/18 1525  NA 137  K 4.0  CL 104  CO2 23  GLUCOSE 137*  BUN 11  CREATININE 1.07  CALCIUM 9.0  PROT 7.0  ALBUMIN 4.1  AST 36   ALT 40  ALKPHOS 71  BILITOT 0.7  GFRNONAA >60  GFRAA >60  ANIONGAP 10     Hematology Recent Labs  Lab 12/15/18 1525  WBC 6.0  RBC 5.00  HGB 15.5  HCT 45.7  MCV 91.4  MCH 31.0  MCHC 33.9  RDW 12.8  PLT 179    BNPNo results for input(s): BNP, PROBNP in the last 168 hours.   DDimer No results for input(s): DDIMER in the last 168 hours.   Radiology    Dg Chest 2 View  Result Date: 12/15/2018 CLINICAL DATA:  Chest pain today, history coronary artery disease post open heart surgery 27 years ago, hypertension EXAM: CHEST - 2 VIEW COMPARISON:  07/14/2015 FINDINGS: Upper normal size of cardiac silhouette post median sternotomy. Mediastinal contours and pulmonary vascularity normal. Lungs clear. Eventration of posterior LEFT diaphragm noted. No pleural effusion or pneumothorax. Bones unremarkable. IMPRESSION: No acute abnormalities. Electronically Signed   By: Lavonia Dana M.D.   On: 12/15/2018 15:56    Cardiac Studies   TTE: pending  Patient Profile     68 y.o. male with a hx of CAD s/p CABGx1 (LIMA to LAD) in  1992, PCI to LCX/OM in 2009, and PCI to mid RCA in 2017 for unstable angina who presents with chest pain.  Assessment & Plan    1.  Unstable Angina: Symptoms started on Friday afternoon after he mowed his yard with the riding Conservation officer, nature. Waxed and waned since that time. Worsened Sunday even and presented to the ED. States symptoms are very similar, actually worse than prior ACS events. Remains on IV nitro with ongoing chest pain, c/o headache and unable to titrate further. Given IV morphine overnight with some improvement.  -- plan for cath today. The patient understands that risks included but are not limited to stroke (1 in 1000), death (1 in 32), kidney failure [usually temporary] (1 in 500), bleeding (1 in 200), allergic reaction [possibly serious] (1 in 200).  -- IV heparin, last dose of Xarelto 2.5mg  yesterday around 8am.  -- echo pending  2. CAD s/p 1v CABG  ('92): PCI to Lcx/OM in 2009 and PCI to [facility] in 2017. Has done well since that time. Maintained on low dose Xarelto and ASA prior to admission, along with statin and BB.   3. HL: on Crestor 20mg    4. HTN: stable on BB therapy  5. DM: metformin held.  -- add SSI  For questions or updates, please contact [place] Please consult [email or web address] for contact info under     Signed, Ryan Bellis, NP  12/16/2018, 8:22 AM    Patient seen and examined. Agree with assessment and plan. Pt continues to have mild chest discomfort; No definitive chest wall tenderness by palpation.  HS troponins are negative which patient states have been a consistent pattern at his prior caths.  For definitive repeat cath this am with Ryan Vaughn. Pt aware of risks/benefits.   Ryan Sine, MD, [facility] 12/16/2018 9:48 AM

## 2018-12-16 NOTE — Interval H&P Note (Signed)
Cath Lab Visit (complete for each Cath Lab visit)  Clinical Evaluation Leading to the Procedure:   ACS: Yes.    Non-ACS:    Anginal Classification: CCS IV  Anti-ischemic medical therapy: Minimal Therapy (1 class of medications)  Non-Invasive Test Results: No non-invasive testing performed  Prior CABG: Previous CABG      History and Physical Interval Note:  12/16/2018 10:05 AM  Ryan Vaughn  has presented today for surgery, with the diagnosis of chest pain.  The various methods of treatment have been discussed with the patient and family. After consideration of risks, benefits and other options for treatment, the patient has consented to  Procedure(s): LEFT HEART CATH AND CORS/GRAFTS ANGIOGRAPHY (N/A) as a surgical intervention.  The patient's history has been reviewed, patient examined, no change in status, stable for surgery.  I have reviewed the patient's chart and labs.  Questions were answered to the patient's satisfaction.     Sherren Mocha

## 2018-12-16 NOTE — Progress Notes (Addendum)
Progress Note  Patient Name: Ryan Vaughn Date of Encounter: 12/16/2018  Primary Cardiologist: No primary care provider on file.   Subjective   Still with ongoing chest pain despite nitro.   Inpatient Medications    Scheduled Meds: . aspirin EC  81 mg Oral Daily  . metoprolol succinate  50 mg Oral Daily  . rosuvastatin  20 mg Oral Daily   Continuous Infusions: . sodium chloride 10 mL/hr (12/15/18 2334)  . nitroGLYCERIN 15 mcg/min (12/16/18 0059)   PRN Meds: acetaminophen, nitroGLYCERIN, nitroGLYCERIN, ondansetron (ZOFRAN) IV   Vital Signs    Vitals:   12/16/18 0225 12/16/18 0325 12/16/18 0722 12/16/18 0800  BP: 110/77 108/74 112/81 121/82  Pulse:   (!) 59   Resp:   18   Temp:   97.6 F (36.4 C)   TempSrc:   Oral   SpO2:   100%   Weight:      Height:        Intake/Output Summary (Last 24 hours) at 12/16/2018 2671 Last data filed at 12/16/2018 0700 Gross per 24 hour  Intake 345 ml  Output 400 ml  Net -55 ml   Last 3 Weights 12/15/2018 08/29/2018 08/24/2017  Weight (lbs) 229 lb 6.4 oz 232 lb 234 lb  Weight (kg) 104.055 kg 105.235 kg 106.142 kg      Telemetry    SR - Personally Reviewed  ECG    SR rate 61 - Personally Reviewed  Physical Exam  Pleasant male, sitting up in bed. Echo at bedside. GEN: No acute distress.   Neck: No JVD Cardiac: RRR, no murmurs, rubs, or gallops.  Respiratory: Clear to auscultation bilaterally. GI: Soft, nontender, non-distended  MS: No edema; No deformity. Neuro:  Nonfocal  Psych: Normal affect   Labs    High Sensitivity Troponin:   Recent Labs  Lab 12/15/18 1525 12/15/18 1737 12/16/18 0319  TROPONINIHS 7 7 6       Cardiac EnzymesNo results for input(s): TROPONINI in the last 168 hours. No results for input(s): TROPIPOC in the last 168 hours.   Chemistry Recent Labs  Lab 12/15/18 1525  NA 137  K 4.0  CL 104  CO2 23  GLUCOSE 137*  BUN 11  CREATININE 1.07  CALCIUM 9.0  PROT 7.0  ALBUMIN 4.1  AST 36   ALT 40  ALKPHOS 71  BILITOT 0.7  GFRNONAA >60  GFRAA >60  ANIONGAP 10     Hematology Recent Labs  Lab 12/15/18 1525  WBC 6.0  RBC 5.00  HGB 15.5  HCT 45.7  MCV 91.4  MCH 31.0  MCHC 33.9  RDW 12.8  PLT 179    BNPNo results for input(s): BNP, PROBNP in the last 168 hours.   DDimer No results for input(s): DDIMER in the last 168 hours.   Radiology    Dg Chest 2 View  Result Date: 12/15/2018 CLINICAL DATA:  Chest pain today, history coronary artery disease post open heart surgery 27 years ago, hypertension EXAM: CHEST - 2 VIEW COMPARISON:  07/14/2015 FINDINGS: Upper normal size of cardiac silhouette post median sternotomy. Mediastinal contours and pulmonary vascularity normal. Lungs clear. Eventration of posterior LEFT diaphragm noted. No pleural effusion or pneumothorax. Bones unremarkable. IMPRESSION: No acute abnormalities. Electronically Signed   By: Lavonia Dana M.D.   On: 12/15/2018 15:56    Cardiac Studies   TTE: pending  Patient Profile     68 y.o. male with a hx of CAD s/p CABGx1 (LIMA to LAD) in  1992, PCI to LCX/OM in 2009, and PCI to mid RCA in 2017 for unstable angina who presents with chest pain.  Assessment & Plan    1.  Unstable Angina: Symptoms started on Friday afternoon after he mowed his yard with the riding [employer] officer, nature. Waxed and waned since that time. Worsened Sunday even and presented to the ED. States symptoms are very similar, actually worse than prior ACS events. Remains on IV nitro with ongoing chest pain, c/o headache and unable to titrate further. Given IV morphine overnight with some improvement.  -- plan for cath today. The patient understands that risks included but are not limited to stroke (1 in 1000), death (1 in 55), kidney failure [usually temporary] (1 in 500), bleeding (1 in 200), allergic reaction [possibly serious] (1 in 200).  -- IV heparin, last dose of Xarelto 2.5mg  yesterday around 8am.  -- echo pending  2. CAD s/p 1v CABG  ('92): PCI to Lcx/OM in 2009 and PCI to [facility] in 2017. Has done well since that time. Maintained on low dose Xarelto and ASA prior to admission, along with statin and BB.   3. HL: on Crestor 20mg    4. HTN: stable on BB therapy  5. DM: metformin held.  -- add SSI  For questions or updates, please contact [place] Please consult [email or web address] for contact info under     Signed, Reino Bellis, NP  12/16/2018, 8:22 AM    Patient seen and examined. Agree with assessment and plan. Pt continues to have mild chest discomfort; No definitive chest wall tenderness by palpation.  HS troponins are negative which patient states have been a consistent pattern at his prior caths.  For definitive repeat cath this am with Dr. Burt Knack. Pt aware of risks/benefits.   Troy Sine, MD, [facility] 12/16/2018 9:48 AM

## 2018-12-17 ENCOUNTER — Other Ambulatory Visit: Payer: Self-pay | Admitting: Cardiology

## 2018-12-17 DIAGNOSIS — I200 Unstable angina: Secondary | ICD-10-CM | POA: Diagnosis not present

## 2018-12-17 DIAGNOSIS — I25110 Atherosclerotic heart disease of native coronary artery with unstable angina pectoris: Secondary | ICD-10-CM | POA: Diagnosis not present

## 2018-12-17 LAB — GLUCOSE, CAPILLARY: Glucose-Capillary: 224 mg/dL — ABNORMAL HIGH (ref 70–99)

## 2018-12-17 MED ORDER — ISOSORBIDE MONONITRATE ER 30 MG PO TB24: 30.0000 mg | ORAL_TABLET | Freq: Every day | ORAL | 1 refills | Status: DC

## 2018-12-17 MED ORDER — ISOSORBIDE MONONITRATE ER 30 MG PO TB24
30.0000 mg | ORAL_TABLET | Freq: Every day | ORAL | Status: DC
  Administered 2018-12-17: 30 mg via ORAL
  Filled 2018-12-17: qty 1

## 2018-12-17 MED ORDER — ROSUVASTATIN CALCIUM 40 MG PO TABS: 40.0000 mg | ORAL_TABLET | Freq: Every day | ORAL | 1 refills | Status: DC

## 2018-12-17 MED ORDER — ROSUVASTATIN CALCIUM 20 MG PO TABS: 40.0000 mg | ORAL_TABLET | Freq: Every day | ORAL | Status: DC

## 2018-12-17 NOTE — Progress Notes (Addendum)
Progress Note  Patient Name: Ryan Vaughn Date of Encounter: 12/17/2018  Primary Cardiologist: No primary care provider on file.   Subjective   Still with chest pain overnight and this morning.   Inpatient Medications    Scheduled Meds: . aspirin EC  81 mg Oral Daily  . insulin aspart  0-15 Units Subcutaneous TID WC  . isosorbide mononitrate  30 mg Oral Daily  . metoprolol succinate  50 mg Oral Daily  . rivaroxaban  2.5 mg Oral BID  . rosuvastatin  20 mg Oral Daily  . sodium chloride flush  3 mL Intravenous Q12H  . sodium chloride flush  3 mL Intravenous Q12H   Continuous Infusions: . sodium chloride 10 mL/hr (12/15/18 2334)  . sodium chloride    . sodium chloride    . sodium chloride 1 mL/kg/hr (12/16/18 0922)   PRN Meds: sodium chloride, sodium chloride, acetaminophen, nitroGLYCERIN, nitroGLYCERIN, ondansetron (ZOFRAN) IV, sodium chloride flush, sodium chloride flush   Vital Signs    Vitals:   12/16/18 1636 12/16/18 1648 12/16/18 2124 12/17/18 0625  BP: 112/72 106/78 115/77 121/81  Pulse: 73  79 66  Resp: 18  16 20   Temp: 98.1 F (36.7 C)  97.7 F (36.5 C) (!) 97.5 F (36.4 C)  TempSrc: Oral  Oral Oral  SpO2: 96% 95% 96% 98%  Weight:    102.1 kg  Height:        Intake/Output Summary (Last 24 hours) at 12/17/2018 0802 Last data filed at 12/17/2018 5643 Gross per 24 hour  Intake 2379.81 ml  Output 2225 ml  Net 154.81 ml   Last 3 Weights 12/17/2018 12/15/2018 08/29/2018  Weight (lbs) 225 lb 1.4 oz 229 lb 6.4 oz 232 lb  Weight (kg) 102.1 kg 104.055 kg 105.235 kg      Telemetry    SR - Personally Reviewed  ECG    N/a - Personally Reviewed  Physical Exam   GEN: No acute distress.   Neck: No JVD Cardiac: RRR, no murmurs, rubs, or gallops.  Respiratory: Clear to auscultation bilaterally. GI: Soft, nontender, non-distended  MS: No edema; No deformity. Left radial cath site stable.  Neuro:  Nonfocal  Psych: Normal affect   Labs    High  Sensitivity Troponin:   Recent Labs  Lab 12/15/18 1525 12/15/18 1737 12/16/18 0319 12/16/18 0745  TROPONINIHS 7 7 6 7       Cardiac EnzymesNo results for input(s): TROPONINI in the last 168 hours. No results for input(s): TROPIPOC in the last 168 hours.   Chemistry Recent Labs  Lab 12/15/18 1525  NA 137  K 4.0  CL 104  CO2 23  GLUCOSE 137*  BUN 11  CREATININE 1.07  CALCIUM 9.0  PROT 7.0  ALBUMIN 4.1  AST 36  ALT 40  ALKPHOS 71  BILITOT 0.7  GFRNONAA >60  GFRAA >60  ANIONGAP 10     Hematology Recent Labs  Lab 12/15/18 1525  WBC 6.0  RBC 5.00  HGB 15.5  HCT 45.7  MCV 91.4  MCH 31.0  MCHC 33.9  RDW 12.8  PLT 179   Lipid Panel     Component Value Date/Time   CHOL 148 07/15/2015 0407   TRIG 473 (H) 07/15/2015 0407   HDL 28 (L) 07/15/2015 0407   CHOLHDL 5.3 07/15/2015 0407   VLDL UNABLE TO CALCULATE IF TRIGLYCERIDE OVER 400 mg/dL 07/15/2015 0407   LDLCALC UNABLE TO CALCULATE IF TRIGLYCERIDE OVER 400 mg/dL 07/15/2015 0407    BNPNo  results for input(s): BNP, PROBNP in the last 168 hours.   DDimer No results for input(s): DDIMER in the last 168 hours.   Radiology    Dg Chest 2 View  Result Date: 12/15/2018 CLINICAL DATA:  Chest pain today, history coronary artery disease post open heart surgery 27 years ago, hypertension EXAM: CHEST - 2 VIEW COMPARISON:  07/14/2015 FINDINGS: Upper normal size of cardiac silhouette post median sternotomy. Mediastinal contours and pulmonary vascularity normal. Lungs clear. Eventration of posterior LEFT diaphragm noted. No pleural effusion or pneumothorax. Bones unremarkable. IMPRESSION: No acute abnormalities. Electronically Signed   By: Lavonia Dana M.D.   On: 12/15/2018 15:56    Cardiac Studies   Cath: 12/16/18  1. Severe single vessel CAD with total occlusion of the proximal LAD and continued patency of the LIMA-LAD 2. Wide patency of the RCA and stented segments of the mid-RCA 3. Patency of the stented segments in  the LCx and first OM with mild-moderate ISR 4. Normal LV function  No high-grade stenosis identified. Favor ongoing medical therapy.  Diagnostic Dominance: Right  TTE: 12/16/18  IMPRESSIONS    1. The left ventricle has normal systolic function, with an ejection fraction of 55-60%. The cavity size was mildly dilated. Left ventricular diastolic Doppler parameters are consistent with impaired relaxation.  2. The right ventricle has normal systolic function. The cavity was normal. There is no increase in right ventricular wall thickness.  Patient Profile     68 y.o. male with a hx of CAD s/p CABGx1 (LIMA to LAD) in 1992, PCI to LCX/OM in 2009, and PCI to mid RCA in 2017 for unstable angina who presents with chest pain.  Assessment & Plan    1.  Unstable Angina: Symptoms started on Friday afternoon after he mowed his yard with the riding Conservation officer, nature. Waxed and waned since that time. Worsened Sunday even and presented to the ED. States symptoms were very similar, actually worse than prior ACS events. Underwent cath with patent LIMA-LAD and patent stents in the RCA, LCx and OM. Echo with normal EF. Still with residual chest pain post cath. Will add Imdur this morning. Hopeful for discharge this afternoon.   2. CAD s/p 1v CABG ('92): PCI to Lcx/OM in 2009 and PCI to [facility] in 2017. Has done well since that time. Maintained on low dose Xarelto and ASA prior to admission, along with statin and BB.   3. HL: on Crestor 20mg    4. HTN: stable on BB therapy  5. DM: metformin held.  -- add SSI while inpatient.  For questions or updates, please contact [place] Please consult [email or web address] for contact info under        Signed, Reino Bellis, NP  12/17/2018, 8:02 AM   .   Patient seen and examined. Agree with assessment and plan. I have reviewed cath finding with patient.  Medical therapy. Will initiate imdur.  Increase crestor to 40 mg, and recheck lipids in 2-3 months; if LDL >  70 add zetia as outpatient. If TG elevated add Vascepa with also improved outcome data. L radial cath site stable.  DC today    Troy Sine, MD, [facility] 12/17/2018 10:07 AM

## 2018-12-17 NOTE — Discharge Summary (Signed)
Discharge Summary    Patient ID: Ryan Vaughn,  MRN: [id number], DOB/AGE: [birth year] 68 y.o.  Admit date: 12/15/2018 Discharge date: 12/17/2018  Primary Care Provider: Curlene Labrum Primary Cardiologist: Kate Sable, MD  Discharge Diagnoses    Principal Problem:   Unstable angina [facility]) Active Problems:   Chest pain   Allergies Allergies  Allergen Reactions  . Ivp Dye [Iodinated Diagnostic Agents] Other (See Comments)    headache    Diagnostic Studies/Procedures    Cath: 12/16/18  1. Severe single vessel CAD with total occlusion of the proximal LAD and continued patency of the LIMA-LAD 2. Wide patency of the RCA and stented segments of the mid-RCA 3. Patency of the stented segments in the LCx and first OM with mild-moderate ISR 4. Normal LV function  No high-grade stenosis identified. Favor ongoing medical therapy.  Diagnostic Dominance: Right   TTE: 12/16/18  IMPRESSIONS    1. The left ventricle has normal systolic function, with an ejection fraction of 55-60%. The cavity size was mildly dilated. Left ventricular diastolic Doppler parameters are consistent with impaired relaxation.  2. The right ventricle has normal systolic function. The cavity was normal. There is no increase in right ventricular wall thickness. _____________   History of Present Illness     68 y.o. male with a hx of CAD s/p CABGx1 (LIMA to LAD) in 1992, PCI to LCX/OM in 2009, and PCI to mid RCA in 2017 for unstable angina who presented with chest pain. He reported chest pain for the past 2 days prior to admission. Symptoms started on Friday afternoon after he mowed his yard with the riding Conservation officer, nature. Waxed and waned since that time. Worsened Sunday even and presented to the ED. Stated symptoms are very similar, actually worse than prior ACS events. The pain was sharp and located in the left chest and his left arm.  It happened randomly and was not clearly associated with  exertion nor relieved with rest.  It typically lasts for seconds to minutes at a time.  He stated that "something was not right" and he typically did not have pain like this.  The pain was similar to his prior anginal equivalent. Patient presented to the ED for further evaluation. Troponin was negative and ECG unchanged from prior. Patient was admitted for further evaluation. He denied any flulike symptoms, dyspnea, fever, lower extremity edema. Placed on IV nitro and heparin on admission.   Hospital Course     Continued to have intermittent chest pain but improved with IV morphine. Underwent cardiac cath noted above with patent LIMA-LAD, patent stents in the mRCA, and Lcx. Mild to moderate ISR of OM stent. Distal Lcx disease of 50% but not felt to be a culprit lesion. Normal LV function. No high grade stenosis identified. Plan to continue with medical therapy. Added Imdur 30mg  post cath. Also increased his Crestor from 20--> 40mg  daily at the time of discharge.    Sallye Lat was seen by Dr. Claiborne Billings and determined stable for discharge home. Follow up in the office has been arranged. Medications are listed below.   _____________  Discharge Vitals Blood pressure 114/72, pulse 66, temperature 97.7 F (36.5 C), temperature source Oral, resp. rate 16, height 5\' 11"  (1.803 m), weight 102.1 kg, SpO2 97 %.  Filed Weights   12/15/18 2221 12/17/18 0625  Weight: 104.1 kg 102.1 kg    Labs & Radiologic Studies    CBC Recent Labs    12/15/18 1525  WBC 6.0  NEUTROABS 3.3  HGB 15.5  HCT 45.7  MCV 91.4  PLT 989   Basic Metabolic Panel Recent Labs    12/15/18 1525  NA 137  K 4.0  CL 104  CO2 23  GLUCOSE 137*  BUN 11  CREATININE 1.07  CALCIUM 9.0   Liver Function Tests Recent Labs    12/15/18 1525  AST 36  ALT 40  ALKPHOS 71  BILITOT 0.7  PROT 7.0  ALBUMIN 4.1   No results for input(s): LIPASE, AMYLASE in the last 72 hours. Cardiac Enzymes No results for input(s): CKTOTAL,  CKMB, CKMBINDEX, TROPONINI in the last 72 hours. BNP Invalid input(s): POCBNP D-Dimer No results for input(s): DDIMER in the last 72 hours. Hemoglobin A1C Recent Labs    12/15/18 2300  HGBA1C 6.8*   Fasting Lipid Panel No results for input(s): CHOL, HDL, LDLCALC, TRIG, CHOLHDL, LDLDIRECT in the last 72 hours. Thyroid Function Tests No results for input(s): TSH, T4TOTAL, T3FREE, THYROIDAB in the last 72 hours.  Invalid input(s): FREET3 _____________  Dg Chest 2 View  Result Date: 12/15/2018 CLINICAL DATA:  Chest pain today, history coronary artery disease post open heart surgery 27 years ago, hypertension EXAM: CHEST - 2 VIEW COMPARISON:  07/14/2015 FINDINGS: Upper normal size of cardiac silhouette post median sternotomy. Mediastinal contours and pulmonary vascularity normal. Lungs clear. Eventration of posterior LEFT diaphragm noted. No pleural effusion or pneumothorax. Bones unremarkable. IMPRESSION: No acute abnormalities. Electronically Signed   By: Lavonia Dana M.D.   On: 12/15/2018 15:56   Disposition   Pt is being discharged home today in good condition.  Follow-up Plans & Appointments    Follow-up Information    Imogene Burn, PA-C Follow up on 01/13/2019.   Specialty: Cardiology Why: at 11am for your follow up appt.  Contact information: [place] [address] [phone]          Discharge Instructions    Diet - low sodium heart healthy   Complete by: As directed    Discharge instructions   Complete by: As directed    Radial Site Care Refer to this sheet in the next few weeks. These instructions provide you with information on caring for yourself after your procedure. Your caregiver may also give you more specific instructions. Your treatment has been planned according to current medical practices, but problems sometimes occur. Call your caregiver if you have any problems or questions after your procedure. HOME CARE INSTRUCTIONS You may shower  the day after the procedure.Remove the bandage (dressing) and gently wash the site with plain soap and water.Gently pat the site dry.  Do not apply powder or lotion to the site.  Do not submerge the affected site in water for 3 to 5 days.  Inspect the site at least twice daily.  Do not flex or bend the affected arm for 24 hours.  No lifting over 5 pounds (2.3 kg) for 5 days after your procedure.  Do not drive home if you are discharged the same day of the procedure. Have someone else drive you.  You may drive 24 hours after the procedure unless otherwise instructed by your caregiver.  What to expect: Any bruising will usually fade within 1 to 2 weeks.  Blood that collects in the tissue (hematoma) may be painful to the touch. It should usually decrease in size and tenderness within 1 to 2 weeks.  SEEK IMMEDIATE MEDICAL CARE IF: You have unusual pain at the radial site.  You have redness, warmth, swelling, or pain at the radial site.  You have drainage (other than a small amount of blood on the dressing).  You have chills.  You have a fever or persistent symptoms for more than 72 hours.  You have a fever and your symptoms suddenly get worse.  Your arm becomes pale, cool, tingly, or numb.  You have heavy bleeding from the site. Hold pressure on the site.   Increase activity slowly   Complete by: As directed        Discharge Medications     Medication List    TAKE these medications   aspirin 81 MG tablet Take 81 mg by mouth daily.   cyclobenzaprine 10 MG tablet Commonly known as: FLEXERIL Take 10 mg by mouth 3 (three) times daily as needed for muscle spasms.   isosorbide mononitrate 30 MG 24 hr tablet Commonly known as: IMDUR Take 1 tablet (30 mg total) by mouth daily. Start taking on: December 18, 2018   metFORMIN 500 MG tablet Commonly known as: GLUCOPHAGE Take 500 mg by mouth 2 (two) times daily with a meal.   metoprolol succinate 50 MG 24 hr tablet Commonly known as:  TOPROL-XL Take 50 mg by mouth daily.   multivitamin tablet Take 1 tablet by mouth daily.   nitroGLYCERIN 0.4 MG SL tablet Commonly known as: NITROSTAT Place 1 tablet (0.4 mg total) under the tongue every 5 (five) minutes x 3 doses as needed for chest pain.   rosuvastatin 40 MG tablet Commonly known as: CRESTOR Take 1 tablet (40 mg total) by mouth daily. Start taking on: December 18, 2018 What changed:   medication strength  how much to take   Xarelto 2.5 MG Tabs tablet Generic drug: rivaroxaban TAKE 1 TABLET BY MOUTH TWICE DAILY What changed: how much to take        Acute coronary syndrome (MI, NSTEMI, STEMI, etc) this admission?: No.     Outstanding Labs/Studies   FLP/LFTs in 6 weeks with statin increase.   Duration of Discharge Encounter   Greater than 30 minutes including physician time.  Signed, Reino Bellis NP-C 12/17/2018, 10:45 AM

## 2018-12-18 DIAGNOSIS — M79602 Pain in left arm: Secondary | ICD-10-CM | POA: Diagnosis not present

## 2018-12-18 DIAGNOSIS — B029 Zoster without complications: Secondary | ICD-10-CM | POA: Diagnosis not present

## 2019-01-06 NOTE — Progress Notes (Signed)
Cardiology Office Note    Date:  01/13/2019   ID:  Naszir, Cott [birth year], MRN [id number]  PCP:  Curlene Labrum, MD  Cardiologist: Kate Sable, MD EPS: None  No chief complaint on file.   History of Present Illness:  Ryan Vaughn is a 68 y.o. male with a hx of CAD s/p CABGx1 (LIMA to LAD) in 1992, PCI to LCX/OM in 2009, and PCI to mid RCA in 2017.  Also has hypertension, HLD and DM.  Patient underwent cardiac cath for recurrent chest pain 12/16/2018 had a patent LIMA to the LAD and patent stents in the RCA  circumflex and mild to moderate ISR of OM stent.  Echo showed normal LV function.  Continued to have residual chest pain post-cath and Imdur was added.  Crestor was increased to 40 mg daily and if LDL remains over 70 after 3 months Zetia should be added per Dr. Claiborne Billings.  Patient broke out in shingles after hospitalization with lesions all the way down his left arm. He thinks this is what caused his chest pain into his left arm. No chest tightness since shingles have been treated.  He is back to work and feels good.     Past Medical History:  Diagnosis Date  . Arthritis   . CAD (coronary artery disease)    a. DES to OM 1 and DES mLCX (12/05/13) b. CABG x1  ('92) c. 6 stents placed in 2009? (pt hx)   . Essential hypertension, benign   . Kidney stones   . Lumbago   . Mixed hyperlipidemia   . Non Hodgkin's lymphoma ([place]) 2002   a. stage IV  . Personal history of colonic polyps     Past Surgical History:  Procedure Laterality Date  . CARDIAC CATHETERIZATION N/A 07/16/2015   Procedure: Left Heart Cath and Cors/Grafts Angiography;  Surgeon: Jettie Booze, MD;  Location: [place] [facility];  Service: Cardiovascular;  Laterality: N/A;  . CARDIAC CATHETERIZATION N/A 07/16/2015   Procedure: Coronary Stent Intervention;  Surgeon: Jettie Booze, MD;  Location: [place] [facility];  Service: Cardiovascular;  Laterality: N/A;  . CHOLECYSTECTOMY  08/2000  .  CORONARY ANGIOPLASTY WITH STENT PLACEMENT  ~ 1999; ~ 2009 X 2; 12/05/2013   "1 +3 + 2 + 2"  . [place]   "CABG X1"  . CYSTOSCOPY W/ URETEROSCOPY W/ LITHOTRIPSY    . CYSTOSTOMY W/ STENT INSERTION    . EXPLORATORY LAPAROTOMY WITH ABDOMINAL MASS EXCISION  08/2000   "looking for cancer"  . INGUINAL HERNIA REPAIR    . LEFT HEART CATH AND CORS/GRAFTS ANGIOGRAPHY N/A 12/16/2018   Procedure: LEFT HEART CATH AND CORS/GRAFTS ANGIOGRAPHY;  Surgeon: Sherren Mocha, MD;  Location: [place] [facility];  Service: Cardiovascular;  Laterality: N/A;  . LEFT HEART CATHETERIZATION WITH CORONARY ANGIOGRAM N/A 12/05/2013   Procedure: LEFT HEART CATHETERIZATION WITH CORONARY ANGIOGRAM;  Surgeon: Blane Ohara, MD;  Location: [facility];  Service: Cardiovascular;  Laterality: N/A;  . PERCUTANEOUS CORONARY STENT INTERVENTION (PCI-S)  12/05/2013   Procedure: PERCUTANEOUS CORONARY STENT INTERVENTION (PCI-S);  Surgeon: Blane Ohara, MD;  Location: [facility] CATH LAB;  Service: Cardiovascular;;  mid circumflex and prox OM2  . TONSILLECTOMY      Current Medications: Current Meds  Medication Sig  . aspirin 81 MG tablet Take 81 mg by mouth daily.  . cyclobenzaprine (FLEXERIL) 10 MG tablet Take 10 mg by mouth 3 (three) times daily  as needed for muscle spasms.  . isosorbide mononitrate (IMDUR) 30 MG 24 hr tablet TAKE 1 TABLET(30 MG) BY MOUTH DAILY  . metFORMIN (GLUCOPHAGE) 500 MG tablet Take 500 mg by mouth 2 (two) times daily with a meal.  . metoprolol succinate (TOPROL-XL) 50 MG 24 hr tablet Take 50 mg by mouth daily.  . Multiple Vitamin (MULTIVITAMIN) tablet Take 1 tablet by mouth daily.  . nitroGLYCERIN (NITROSTAT) 0.4 MG SL tablet Place 1 tablet (0.4 mg total) under the tongue every 5 (five) minutes x 3 doses as needed for chest pain.  . rosuvastatin (CRESTOR) 40 MG tablet Take 1 tablet (40 mg total) by mouth daily.  Alveda Reasons 2.5 MG TABS tablet TAKE 1 TABLET BY MOUTH TWICE DAILY (Patient  taking differently: Take 2.5 mg by mouth 2 (two) times daily. )     Allergies:   Ivp dye [iodinated diagnostic agents]   Social History   Socioeconomic History  . Marital status: Single    Spouse name: Not on file  . Number of children: Not on file  . Years of education: Not on file  . Highest education level: Not on file  Occupational History  . Not on file  Social Needs  . Financial resource strain: Not on file  . Food insecurity    Worry: Not on file    Inability: Not on file  . Transportation needs    Medical: Not on file    Non-medical: Not on file  Tobacco Use  . Smoking status: Never Smoker  . Smokeless tobacco: Current User    Types: Snuff  . Tobacco comment: 12/05/2013 "use snuff q now and again"  Substance and Sexual Activity  . Alcohol use: Yes    Alcohol/week: 0.0 standard drinks    Comment: 12/05/2013 "maybe 2 cans beer/yr"  . Drug use: No  . Sexual activity: Yes  Lifestyle  . Physical activity    Days per week: Not on file    Minutes per session: Not on file  . Stress: Not on file  Relationships  . Social Herbalist on phone: Not on file    Gets together: Not on file    Attends religious service: Not on file    Active member of club or organization: Not on file    Attends meetings of clubs or organizations: Not on file    Relationship status: Not on file  Other Topics Concern  . Not on file  Social History Narrative  . Not on file     Family History:  The patient's family history includes Breast cancer in his mother; Colon cancer in his father.   ROS:   Please see the history of present illness.    ROS All other systems reviewed and are negative.   PHYSICAL EXAM:   VS:  BP 122/77   Pulse 84   Temp 97.8 F (36.6 C)   Ht 5\' 11"  (1.803 m)   Wt 229 lb 6.4 oz (104.1 kg)   SpO2 98%   BMI 31.99 kg/m   Physical Exam  GEN: Obese, in no acute distress  Neck: no JVD, carotid bruits, or masses Cardiac:RRR; no murmurs, rubs, or gallops   Respiratory:  clear to auscultation bilaterally, normal work of breathing GI: soft, nontender, nondistended, + BS Ext: Left arm at cath site without hematoma or hemorrhage, good radial brachial pulses, lower extremities without cyanosis, clubbing, or edema, Good distal pulses bilaterally Neuro:  Alert and Oriented x 3 Psych:  euthymic mood, full affect  Wt Readings from Last 3 Encounters:  01/13/19 229 lb 6.4 oz (104.1 kg)  12/17/18 225 lb 1.4 oz (102.1 kg)  08/29/18 232 lb (105.2 kg)      Studies/Labs Reviewed:   EKG:  EKG is not ordered today.   Recent Labs: 12/15/2018: ALT 40; BUN 11; Creatinine, Ser 1.07; Hemoglobin 15.5; Platelets 179; Potassium 4.0; Sodium 137   Lipid Panel    Component Value Date/Time   CHOL 148 07/15/2015 0407   TRIG 473 (H) 07/15/2015 0407   HDL 28 (L) 07/15/2015 0407   CHOLHDL 5.3 07/15/2015 0407   VLDL UNABLE TO CALCULATE IF TRIGLYCERIDE OVER 400 mg/dL 07/15/2015 0407   LDLCALC UNABLE TO CALCULATE IF TRIGLYCERIDE OVER 400 mg/dL 07/15/2015 0407    Additional studies/ records that were reviewed today include:   Cath: 12/16/18   1. Severe single vessel CAD with total occlusion of the proximal LAD and continued patency of the LIMA-LAD 2. Wide patency of the RCA and stented segments of the mid-RCA 3. Patency of the stented segments in the LCx and first OM with mild-moderate ISR 4. Normal LV function   No high-grade stenosis identified. Favor ongoing medical therapy.   TTE: 12/16/18   IMPRESSIONS      1. The left ventricle has normal systolic function, with an ejection fraction of 55-60%. The cavity size was mildly dilated. Left ventricular diastolic Doppler parameters are consistent with impaired relaxation.  2. The right ventricle has normal systolic function. The cavity was normal. There is no increase in right ventricular wall thickness.      ASSESSMENT:    1. Coronary artery disease involving native coronary artery of native heart without  angina pectoris   2. Essential hypertension   3. Mixed hyperlipidemia      PLAN:  In order of problems listed above:  CAD status post CABG x1 with a LIMA to the LAD in 1992, PCI to the circumflex and OM in 2009 and PCI to the mid RCA 2017.  Repeat cardiac cath for chest pain 12/16/2018 patent stents with mild 50% in-stent restenosis of the OM stent, patent LIMA to the LAD and patent stents in the RCA and circumflex.normal LVEF on echo.  Medical therapy recommended.  Imdur added and Crestor increased.  Patient broke out in shingles after hospitalization thinks this is the cause of his chest pain.  Has not had any since his shingles was treated.  Recommend 150 minutes of exercise weekly and weight loss.  Follow-up with Dr. Bronson Ing in April.  Essential hypertension blood pressure well controlled  Hyperlipidemia Crestor increased to 40 mg daily.  Needs repeat FLP and LFTs in 3 months-patient says he will have it done at his PCPs and sent to [place].  Obesity-patient has lost 5 pounds since his hospitalization.  Continue weight loss and exercise program recommended  Medication Adjustments/Labs and Tests Ordered: Current medicines are reviewed at length with the patient today.  Concerns regarding medicines are outlined above.  Medication changes, Labs and Tests ordered today are listed in the Patient Instructions below. There are no Patient Instructions on file for this visit.   Sumner Boast, PA-C  01/13/2019 11:23 AM    [facility] [address], [place], [place]  [address] Phone: [phone]; Fax: [phone]

## 2019-01-13 ENCOUNTER — Other Ambulatory Visit: Payer: Self-pay

## 2019-01-13 ENCOUNTER — Encounter: Payer: Self-pay | Admitting: Physician Assistant

## 2019-01-13 ENCOUNTER — Ambulatory Visit (INDEPENDENT_AMBULATORY_CARE_PROVIDER_SITE_OTHER): Payer: Medicare Other | Admitting: Physician Assistant

## 2019-01-13 VITALS — BP 122/77 | HR 84 | Temp 97.8°F | Ht 71.0 in | Wt 229.4 lb

## 2019-01-13 DIAGNOSIS — I10 Essential (primary) hypertension: Secondary | ICD-10-CM

## 2019-01-13 DIAGNOSIS — I200 Unstable angina: Secondary | ICD-10-CM

## 2019-01-13 DIAGNOSIS — E669 Obesity, unspecified: Secondary | ICD-10-CM | POA: Insufficient documentation

## 2019-01-13 DIAGNOSIS — I2510 Atherosclerotic heart disease of native coronary artery without angina pectoris: Secondary | ICD-10-CM

## 2019-01-13 DIAGNOSIS — E782 Mixed hyperlipidemia: Secondary | ICD-10-CM

## 2019-01-13 NOTE — Patient Instructions (Signed)
Medication Instructions:  Your physician recommends that you continue on your current medications as directed. Please refer to the Current Medication list given to you today.  If you need a refill on your cardiac medications before your next appointment, please call your pharmacy.   Lab work: NONE   If you have labs (blood work) drawn today and your tests are completely normal, you will receive your results only by: Marland Kitchen MyChart Message (if you have MyChart) OR . A paper copy in the mail If you have any lab test that is abnormal or we need to change your treatment, we will call you to review the results.  Testing/Procedures: NONE    Follow-Up: At [facility], you and your health needs are our priority.  As part of our continuing mission to provide you with exceptional heart care, we have created designated Provider Care Teams.  These Care Teams include your primary Cardiologist (physician) and Advanced Practice Providers (APPs -  Physician Assistants and Nurse Practitioners) who all work together to provide you with the care you need, when you need it. You will need a follow up appointment in 9 months.  Please call our office 2 months in advance to schedule this appointment.  You may see Kate Sable, MD or one of the following Advanced Practice Providers on your designated Care Team:   Bernerd Pho, PA-C [facility]) . Ermalinda Barrios, PA-C ([place])  Any Other Special Instructions Will Be Listed Below (If Applicable). Thank you for choosing [facility]!

## 2019-02-13 ENCOUNTER — Other Ambulatory Visit: Payer: Self-pay | Admitting: Cardiovascular Disease

## 2019-02-20 DIAGNOSIS — E1169 Type 2 diabetes mellitus with other specified complication: Secondary | ICD-10-CM | POA: Diagnosis not present

## 2019-02-20 DIAGNOSIS — I240 Acute coronary thrombosis not resulting in myocardial infarction: Secondary | ICD-10-CM | POA: Diagnosis not present

## 2019-02-20 DIAGNOSIS — E7800 Pure hypercholesterolemia, unspecified: Secondary | ICD-10-CM | POA: Diagnosis not present

## 2019-02-20 DIAGNOSIS — I10 Essential (primary) hypertension: Secondary | ICD-10-CM | POA: Diagnosis not present

## 2019-02-20 DIAGNOSIS — E782 Mixed hyperlipidemia: Secondary | ICD-10-CM | POA: Diagnosis not present

## 2019-02-20 DIAGNOSIS — R5383 Other fatigue: Secondary | ICD-10-CM | POA: Diagnosis not present

## 2019-02-20 DIAGNOSIS — Z955 Presence of coronary angioplasty implant and graft: Secondary | ICD-10-CM | POA: Diagnosis not present

## 2019-02-24 DIAGNOSIS — Z6832 Body mass index (BMI) 32.0-32.9, adult: Secondary | ICD-10-CM | POA: Diagnosis not present

## 2019-02-24 DIAGNOSIS — I2510 Atherosclerotic heart disease of native coronary artery without angina pectoris: Secondary | ICD-10-CM | POA: Diagnosis not present

## 2019-02-24 DIAGNOSIS — Z23 Encounter for immunization: Secondary | ICD-10-CM | POA: Diagnosis not present

## 2019-02-24 DIAGNOSIS — Z955 Presence of coronary angioplasty implant and graft: Secondary | ICD-10-CM | POA: Diagnosis not present

## 2019-02-24 DIAGNOSIS — Z8572 Personal history of non-Hodgkin lymphomas: Secondary | ICD-10-CM | POA: Diagnosis not present

## 2019-02-24 DIAGNOSIS — I10 Essential (primary) hypertension: Secondary | ICD-10-CM | POA: Diagnosis not present

## 2019-02-24 DIAGNOSIS — E1169 Type 2 diabetes mellitus with other specified complication: Secondary | ICD-10-CM | POA: Diagnosis not present

## 2019-02-24 DIAGNOSIS — Z86010 Personal history of colonic polyps: Secondary | ICD-10-CM | POA: Diagnosis not present

## 2019-05-22 ENCOUNTER — Other Ambulatory Visit: Payer: Self-pay | Admitting: Cardiovascular Disease

## 2019-06-16 ENCOUNTER — Other Ambulatory Visit: Payer: Self-pay | Admitting: Cardiology

## 2019-07-19 ENCOUNTER — Ambulatory Visit: Payer: Medicare Other

## 2019-08-25 DIAGNOSIS — E782 Mixed hyperlipidemia: Secondary | ICD-10-CM | POA: Diagnosis not present

## 2019-08-25 DIAGNOSIS — E1169 Type 2 diabetes mellitus with other specified complication: Secondary | ICD-10-CM | POA: Diagnosis not present

## 2019-08-25 DIAGNOSIS — I10 Essential (primary) hypertension: Secondary | ICD-10-CM | POA: Diagnosis not present

## 2019-09-01 DIAGNOSIS — Z955 Presence of coronary angioplasty implant and graft: Secondary | ICD-10-CM | POA: Diagnosis not present

## 2019-09-01 DIAGNOSIS — I2510 Atherosclerotic heart disease of native coronary artery without angina pectoris: Secondary | ICD-10-CM | POA: Diagnosis not present

## 2019-09-01 DIAGNOSIS — E782 Mixed hyperlipidemia: Secondary | ICD-10-CM | POA: Diagnosis not present

## 2019-09-01 DIAGNOSIS — I10 Essential (primary) hypertension: Secondary | ICD-10-CM | POA: Diagnosis not present

## 2019-09-01 DIAGNOSIS — Z86010 Personal history of colonic polyps: Secondary | ICD-10-CM | POA: Diagnosis not present

## 2019-09-01 DIAGNOSIS — Z6832 Body mass index (BMI) 32.0-32.9, adult: Secondary | ICD-10-CM | POA: Diagnosis not present

## 2019-09-01 DIAGNOSIS — Z0001 Encounter for general adult medical examination with abnormal findings: Secondary | ICD-10-CM | POA: Diagnosis not present

## 2019-09-01 DIAGNOSIS — R300 Dysuria: Secondary | ICD-10-CM | POA: Diagnosis not present

## 2019-09-01 DIAGNOSIS — E1169 Type 2 diabetes mellitus with other specified complication: Secondary | ICD-10-CM | POA: Diagnosis not present

## 2019-09-01 DIAGNOSIS — Z8572 Personal history of non-Hodgkin lymphomas: Secondary | ICD-10-CM | POA: Diagnosis not present

## 2019-09-09 DIAGNOSIS — I700 Atherosclerosis of aorta: Secondary | ICD-10-CM | POA: Diagnosis not present

## 2019-09-09 DIAGNOSIS — K429 Umbilical hernia without obstruction or gangrene: Secondary | ICD-10-CM | POA: Diagnosis not present

## 2019-09-09 DIAGNOSIS — N200 Calculus of kidney: Secondary | ICD-10-CM | POA: Diagnosis not present

## 2019-09-09 DIAGNOSIS — I2510 Atherosclerotic heart disease of native coronary artery without angina pectoris: Secondary | ICD-10-CM | POA: Diagnosis not present

## 2019-09-09 DIAGNOSIS — I10 Essential (primary) hypertension: Secondary | ICD-10-CM | POA: Diagnosis not present

## 2019-09-09 DIAGNOSIS — R109 Unspecified abdominal pain: Secondary | ICD-10-CM | POA: Diagnosis not present

## 2019-09-09 DIAGNOSIS — Z8572 Personal history of non-Hodgkin lymphomas: Secondary | ICD-10-CM | POA: Diagnosis not present

## 2019-09-09 DIAGNOSIS — I252 Old myocardial infarction: Secondary | ICD-10-CM | POA: Diagnosis not present

## 2019-09-09 DIAGNOSIS — Z955 Presence of coronary angioplasty implant and graft: Secondary | ICD-10-CM | POA: Diagnosis not present

## 2019-09-09 DIAGNOSIS — Z79899 Other long term (current) drug therapy: Secondary | ICD-10-CM | POA: Diagnosis not present

## 2019-09-09 DIAGNOSIS — N132 Hydronephrosis with renal and ureteral calculous obstruction: Secondary | ICD-10-CM | POA: Diagnosis not present

## 2019-09-09 DIAGNOSIS — Z7982 Long term (current) use of aspirin: Secondary | ICD-10-CM | POA: Diagnosis not present

## 2019-09-09 DIAGNOSIS — Z87442 Personal history of urinary calculi: Secondary | ICD-10-CM | POA: Diagnosis not present

## 2019-09-09 DIAGNOSIS — Z7902 Long term (current) use of antithrombotics/antiplatelets: Secondary | ICD-10-CM | POA: Diagnosis not present

## 2019-09-09 DIAGNOSIS — K5790 Diverticulosis of intestine, part unspecified, without perforation or abscess without bleeding: Secondary | ICD-10-CM | POA: Diagnosis not present

## 2019-09-10 ENCOUNTER — Other Ambulatory Visit: Payer: Self-pay

## 2019-09-10 ENCOUNTER — Emergency Department (HOSPITAL_COMMUNITY)
Admission: EM | Admit: 2019-09-10 | Discharge: 2019-09-10 | Disposition: A | Discharge status: Home / Self Care | Payer: Medicare Other | Attending: Emergency Medicine | Admitting: Emergency Medicine

## 2019-09-10 ENCOUNTER — Other Ambulatory Visit: Payer: Self-pay | Admitting: Urology

## 2019-09-10 ENCOUNTER — Other Ambulatory Visit (HOSPITAL_COMMUNITY)
Admission: RE | Admit: 2019-09-10 | Discharge: 2019-09-10 | Disposition: A | Discharge status: Home / Self Care | Payer: Medicare Other | Source: Ambulatory Visit | Attending: Urology | Admitting: Urology

## 2019-09-10 ENCOUNTER — Encounter (HOSPITAL_COMMUNITY): Payer: Self-pay

## 2019-09-10 ENCOUNTER — Encounter (HOSPITAL_COMMUNITY): Payer: Self-pay | Admitting: Urology

## 2019-09-10 DIAGNOSIS — Z79899 Other long term (current) drug therapy: Secondary | ICD-10-CM | POA: Diagnosis not present

## 2019-09-10 DIAGNOSIS — R1110 Vomiting, unspecified: Secondary | ICD-10-CM | POA: Diagnosis not present

## 2019-09-10 DIAGNOSIS — R1084 Generalized abdominal pain: Secondary | ICD-10-CM | POA: Diagnosis not present

## 2019-09-10 DIAGNOSIS — I2510 Atherosclerotic heart disease of native coronary artery without angina pectoris: Secondary | ICD-10-CM | POA: Insufficient documentation

## 2019-09-10 DIAGNOSIS — Z8572 Personal history of non-Hodgkin lymphomas: Secondary | ICD-10-CM | POA: Insufficient documentation

## 2019-09-10 DIAGNOSIS — Z20822 Contact with and (suspected) exposure to covid-19: Secondary | ICD-10-CM | POA: Diagnosis not present

## 2019-09-10 DIAGNOSIS — Z7901 Long term (current) use of anticoagulants: Secondary | ICD-10-CM | POA: Insufficient documentation

## 2019-09-10 DIAGNOSIS — Z7982 Long term (current) use of aspirin: Secondary | ICD-10-CM | POA: Diagnosis not present

## 2019-09-10 DIAGNOSIS — Z955 Presence of coronary angioplasty implant and graft: Secondary | ICD-10-CM | POA: Insufficient documentation

## 2019-09-10 DIAGNOSIS — Z951 Presence of aortocoronary bypass graft: Secondary | ICD-10-CM | POA: Diagnosis not present

## 2019-09-10 DIAGNOSIS — N200 Calculus of kidney: Secondary | ICD-10-CM | POA: Diagnosis not present

## 2019-09-10 DIAGNOSIS — R8279 Other abnormal findings on microbiological examination of urine: Secondary | ICD-10-CM | POA: Diagnosis not present

## 2019-09-10 DIAGNOSIS — N201 Calculus of ureter: Secondary | ICD-10-CM | POA: Diagnosis not present

## 2019-09-10 DIAGNOSIS — I10 Essential (primary) hypertension: Secondary | ICD-10-CM | POA: Diagnosis not present

## 2019-09-10 DIAGNOSIS — F17228 Nicotine dependence, chewing tobacco, with other nicotine-induced disorders: Secondary | ICD-10-CM | POA: Insufficient documentation

## 2019-09-10 DIAGNOSIS — R1031 Right lower quadrant pain: Secondary | ICD-10-CM | POA: Diagnosis present

## 2019-09-10 DIAGNOSIS — N132 Hydronephrosis with renal and ureteral calculous obstruction: Secondary | ICD-10-CM | POA: Diagnosis not present

## 2019-09-10 DIAGNOSIS — Z7984 Long term (current) use of oral hypoglycemic drugs: Secondary | ICD-10-CM | POA: Insufficient documentation

## 2019-09-10 LAB — BASIC METABOLIC PANEL
Anion gap: 15 (ref 5–15)
BUN: 23 mg/dL (ref 8–23)
CO2: 25 mmol/L (ref 22–32)
Calcium: 9.6 mg/dL (ref 8.9–10.3)
Chloride: 99 mmol/L (ref 98–111)
Creatinine, Ser: 1.55 mg/dL — ABNORMAL HIGH (ref 0.61–1.24)
GFR calc Af Amer: 53 mL/min — ABNORMAL LOW (ref >60)
GFR calc non Af Amer: 46 mL/min — ABNORMAL LOW (ref >60)
Glucose, Bld: 154 mg/dL — ABNORMAL HIGH (ref 70–99)
Potassium: 4.3 mmol/L (ref 3.5–5.1)
Sodium: 139 mmol/L (ref 135–145)

## 2019-09-10 LAB — CBC WITH DIFFERENTIAL/PLATELET
Abs Immature Granulocytes: 0.08 10*3/uL — ABNORMAL HIGH (ref 0.00–0.07)
Basophils Absolute: 0 10*3/uL (ref 0.0–0.1)
Basophils Relative: 0 %
Eosinophils Absolute: 0 10*3/uL (ref 0.0–0.5)
Eosinophils Relative: 0 %
HCT: 43.6 % (ref 39.0–52.0)
Hemoglobin: 14.5 g/dL (ref 13.0–17.0)
Immature Granulocytes: 1 %
Lymphocytes Relative: 14 %
Lymphs Abs: 1.3 10*3/uL (ref 0.7–4.0)
MCH: 30.2 pg (ref 26.0–34.0)
MCHC: 33.3 g/dL (ref 30.0–36.0)
MCV: 90.8 fL (ref 80.0–100.0)
Monocytes Absolute: 0.8 10*3/uL (ref 0.1–1.0)
Monocytes Relative: 9 %
Neutro Abs: 7.1 10*3/uL (ref 1.7–7.7)
Neutrophils Relative %: 76 %
Platelets: 172 10*3/uL (ref 150–400)
RBC: 4.8 MIL/uL (ref 4.22–5.81)
RDW: 12.9 % (ref 11.5–15.5)
WBC: 9.3 10*3/uL (ref 4.0–10.5)
nRBC: 0 % (ref 0.0–0.2)

## 2019-09-10 LAB — URINALYSIS, ROUTINE W REFLEX MICROSCOPIC
Bilirubin Urine: NEGATIVE
Glucose, UA: NEGATIVE mg/dL
Ketones, ur: NEGATIVE mg/dL
Leukocytes,Ua: NEGATIVE
Nitrite: NEGATIVE
Protein, ur: 100 mg/dL — AB
RBC / HPF: >50 RBC/hpf — ABNORMAL HIGH (ref 0–5)
Specific Gravity, Urine: 1.033 — ABNORMAL HIGH (ref 1.005–1.030)
WBC, UA: >50 WBC/hpf — ABNORMAL HIGH (ref 0–5)
pH: 5 (ref 5.0–8.0)

## 2019-09-10 LAB — SARS CORONAVIRUS 2 (TAT 6-24 HRS): SARS Coronavirus 2: NEGATIVE

## 2019-09-10 MED ORDER — ONDANSETRON HCL 4 MG/2ML IJ SOLN
4.0000 mg | Freq: Once | INTRAMUSCULAR | Status: AC
  Administered 2019-09-10: 4 mg via INTRAVENOUS
  Filled 2019-09-10: qty 2

## 2019-09-10 MED ORDER — KETOROLAC TROMETHAMINE 30 MG/ML IJ SOLN
15.0000 mg | Freq: Once | INTRAMUSCULAR | Status: AC
  Administered 2019-09-10: 15 mg via INTRAVENOUS
  Filled 2019-09-10: qty 1

## 2019-09-10 MED ORDER — SODIUM CHLORIDE 0.9 % IV BOLUS
500.0000 mL | Freq: Once | INTRAVENOUS | Status: AC
  Administered 2019-09-10: 06:00:00 500 mL via INTRAVENOUS

## 2019-09-10 MED ORDER — ONDANSETRON HCL 4 MG/2ML IJ SOLN
4.0000 mg | Freq: Once | INTRAMUSCULAR | Status: AC
  Administered 2019-09-10: 02:00:00 4 mg via INTRAVENOUS
  Filled 2019-09-10: qty 2

## 2019-09-10 MED ORDER — HYDROMORPHONE HCL 1 MG/ML IJ SOLN
0.5000 mg | Freq: Once | INTRAMUSCULAR | Status: AC
  Administered 2019-09-10: 0.5 mg via INTRAVENOUS
  Filled 2019-09-10: qty 1

## 2019-09-10 NOTE — ED Triage Notes (Signed)
Patient arrived stating he has a known kidney stone on his right side. Reports having an appointment tomorrow but told to come to the ED if he has an problems. Reports increased pain and vomiting.

## 2019-09-10 NOTE — ED Notes (Signed)
Pt given water for PO challenge 

## 2019-09-10 NOTE — ED Notes (Signed)
Pt verbalized dc instructions and follow up care. Alert and ambulatory. No iv. 

## 2019-09-10 NOTE — Discharge Instructions (Signed)
It is very important that you follow-up with Dr. Lovena Neighbours as previously scheduled today.  Go to your appointment.  Take your medications that you are prescribed by [facility] as directed.  Make sure you are staying hydrated drinking plenty of fluid.  Return the emergency department for any worsening pain, vomiting, fever or any other worsening or concerning symptoms.

## 2019-09-10 NOTE — ED Provider Notes (Signed)
[place] DEPT Provider Note   CSN: [id number] Arrival date & time: 09/10/19  0110     History No chief complaint on file.   Ryan Vaughn is a 69 y.o. male past medical history of CAD, kidney stones, hyperlipidemia who presents for evaluation of continued right-sided abdominal and flank pain.  Patient reports that this morning while at work, he started having right flank pain consistent with previous episodes of kidney stone.  He reports he went to the ED and needed and states that he was found to have a 9 mm kidney stone noted in the right side.  He states that he was discharged home with pain medication and was told to follow-up with Dr. Lovena Neighbours ([facility]) today at 1 PM.  He states that he went home and his pain was controlled but then throughout the night, his pain got worse.  He did take hydrocodone at 7 as well as 10 PM.  He reports that tonight, he also started vomiting and was unable to get control of his pain, prompting ED visit.  He states that his pain is a 9/10.  He does report he still able to urinate but does report hematuria.  No fevers.  The history is provided by the patient.       Past Medical History:  Diagnosis Date  . Arthritis   . CAD (coronary artery disease)    a. DES to OM 1 and DES mLCX (12/05/13) b. CABG x1  ('92) c. 6 stents placed in 2009? (pt hx)   . Essential hypertension, benign   . Kidney stones   . Lumbago   . Mixed hyperlipidemia   . Non Hodgkin's lymphoma ([place]) 2002   a. stage IV  . Personal history of colonic polyps     Patient Active Problem List   Diagnosis Date Noted  . Obesity (BMI 30-39.9) 01/13/2019  . Chest pain 12/15/2018  . Coronary artery disease involving native coronary artery of native heart with unstable angina pectoris ([place])   . Unstable angina ([place]) 12/06/2013  . CAD (coronary artery disease)   . Non Hodgkin's lymphoma ([place])   . Arthritis   . Kidney stones   . S/P CABG (coronary  artery bypass graft) 02/06/2013  . HTN (hypertension) 02/06/2013  . Hyperlipidemia 02/06/2013  . Colon polyps 02/06/2013    Past Surgical History:  Procedure Laterality Date  . CARDIAC CATHETERIZATION N/A 07/16/2015   Procedure: Left Heart Cath and Cors/Grafts Angiography;  Surgeon: Jettie Booze, MD;  Location: [place] [facility];  Service: Cardiovascular;  Laterality: N/A;  . CARDIAC CATHETERIZATION N/A 07/16/2015   Procedure: Coronary Stent Intervention;  Surgeon: Jettie Booze, MD;  Location: [place] [facility];  Service: Cardiovascular;  Laterality: N/A;  . CHOLECYSTECTOMY  08/2000  . CORONARY ANGIOPLASTY WITH STENT PLACEMENT  ~ 1999; ~ 2009 X 2; 12/05/2013   "1 +3 + 2 + 2"  . [place]   "CABG X1"  . CYSTOSCOPY W/ URETEROSCOPY W/ LITHOTRIPSY    . CYSTOSTOMY W/ STENT INSERTION    . EXPLORATORY LAPAROTOMY WITH ABDOMINAL MASS EXCISION  08/2000   "looking for cancer"  . INGUINAL HERNIA REPAIR    . LEFT HEART CATH AND CORS/GRAFTS ANGIOGRAPHY N/A 12/16/2018   Procedure: LEFT HEART CATH AND CORS/GRAFTS ANGIOGRAPHY;  Surgeon: Sherren Mocha, MD;  Location: [place] [facility];  Service: Cardiovascular;  Laterality: N/A;  . LEFT HEART CATHETERIZATION WITH CORONARY ANGIOGRAM N/A 12/05/2013  Procedure: LEFT HEART CATHETERIZATION WITH CORONARY ANGIOGRAM;  Surgeon: Blane Ohara, MD;  Location: [facility] CATH LAB;  Service: Cardiovascular;  Laterality: N/A;  . PERCUTANEOUS CORONARY STENT INTERVENTION (PCI-S)  12/05/2013   Procedure: PERCUTANEOUS CORONARY STENT INTERVENTION (PCI-S);  Surgeon: Blane Ohara, MD;  Location: [facility] CATH LAB;  Service: Cardiovascular;;  mid circumflex and prox OM2  . TONSILLECTOMY         Family History  Problem Relation Age of Onset  . Breast cancer Mother   . Colon cancer Father     Social History   Tobacco Use  . Smoking status: Never Smoker  . Smokeless tobacco: Current User    Types: Snuff  . Tobacco comment: 12/05/2013  "use snuff q now and again"  Substance Use Topics  . Alcohol use: Yes    Alcohol/week: 0.0 standard drinks    Comment: 12/05/2013 "maybe 2 cans beer/yr"  . Drug use: No    Home Medications Prior to Admission medications   Medication Sig Start Date End Date Taking? Authorizing Provider  aspirin 81 MG tablet Take 81 mg by mouth daily.   Yes [provider]  HYDROcodone-acetaminophen (NORCO/VICODIN) 5-325 MG tablet Take 1 tablet by mouth every 4 (four) hours as needed for moderate pain.   Yes [provider]  ketorolac (TORADOL) 10 MG tablet Take 10 mg by mouth every 6 (six) hours as needed for moderate pain.   Yes [provider]  metFORMIN (GLUCOPHAGE) 500 MG tablet Take 500 mg by mouth 2 (two) times daily with a meal.   Yes [provider]  metoprolol succinate (TOPROL-XL) 50 MG 24 hr tablet Take 50 mg by mouth daily.   Yes [provider]  nitroGLYCERIN (NITROSTAT) 0.4 MG SL tablet Place 1 tablet (0.4 mg total) under the tongue every 5 (five) minutes x 3 doses as needed for chest pain. 08/28/18  Yes Herminio Commons, MD  ondansetron (ZOFRAN) 4 MG tablet Take 4 mg by mouth every 6 (six) hours as needed for nausea or vomiting.   Yes [provider]  rosuvastatin (CRESTOR) 40 MG tablet TAKE 1 TABLET(40 MG) BY MOUTH DAILY Patient taking differently: Take 40 mg by mouth daily.  06/16/19  Yes Herminio Commons, MD  tamsulosin (FLOMAX) 0.4 MG CAPS capsule Take 0.4 mg by mouth daily.   Yes [provider]  XARELTO 2.5 MG TABS tablet TAKE 1 TABLET BY MOUTH TWICE DAILY Patient taking differently: Take 2.5 mg by mouth 2 (two) times daily.  05/22/19  Yes Herminio Commons, MD  isosorbide mononitrate (IMDUR) 30 MG 24 hr tablet TAKE 1 TABLET(30 MG) BY MOUTH DAILY Patient not taking: Reported on 09/10/2019 12/17/18   Herminio Commons, MD    Allergies    Ivp dye [iodinated diagnostic agents]  Review of Systems   Review of Systems    Constitutional: Negative for fever.  Gastrointestinal: Positive for abdominal pain, nausea and vomiting.  Genitourinary: Positive for flank pain.  All other systems reviewed and are negative.   Physical Exam Updated Vital Signs BP 117/72   Pulse 76   Temp 98.3 F (36.8 C) (Oral)   Resp 17   Ht 5\' 11"  (1.803 m)   Wt 102.1 kg   SpO2 99%   BMI 31.38 kg/m   Physical Exam Vitals and nursing note reviewed.  Constitutional:      Appearance: Normal appearance. He is well-developed.  HENT:     Head: Normocephalic and atraumatic.  Eyes:  General: Lids are normal.     Conjunctiva/sclera: Conjunctivae normal.     Pupils: Pupils are equal, round, and reactive to light.  Cardiovascular:     Rate and Rhythm: Normal rate and regular rhythm.     Pulses: Normal pulses.     Heart sounds: Normal heart sounds. No murmur. No friction rub. No gallop.   Pulmonary:     Effort: Pulmonary effort is normal.     Breath sounds: Normal breath sounds.  Abdominal:     Palpations: Abdomen is soft. Abdomen is not rigid.     Tenderness: There is abdominal tenderness in the right lower quadrant. There is right CVA tenderness. There is no guarding.     Comments: Abdomen soft, nondistended.  Tenderness palpation of the right lower quadrant.  No rigidity, guarding.  Right-sided CVA tenderness noted.  Musculoskeletal:        General: Normal range of motion.     Cervical back: Full passive range of motion without pain.  Skin:    General: Skin is warm and dry.     Capillary Refill: Capillary refill takes less than 2 seconds.  Neurological:     Mental Status: He is alert and oriented to person, place, and time.  Psychiatric:        Speech: Speech normal.     ED Results / Procedures / Treatments   Labs (all labs ordered are listed, but only abnormal results are displayed) Labs Reviewed  BASIC METABOLIC PANEL - Abnormal; Notable for the following components:      Result Value   Glucose, Bld 154 (*)     Creatinine, Ser 1.55 (*)    GFR calc non Af Amer 46 (*)    GFR calc Af Amer 53 (*)    All other components within normal limits  CBC WITH DIFFERENTIAL/PLATELET - Abnormal; Notable for the following components:   Abs Immature Granulocytes 0.08 (*)    All other components within normal limits  URINALYSIS, ROUTINE W REFLEX MICROSCOPIC - Abnormal; Notable for the following components:   Color, Urine RED (*)    APPearance CLOUDY (*)    Specific Gravity, Urine 1.033 (*)    Hgb urine dipstick LARGE (*)    Protein, ur 100 (*)    RBC / HPF >50 (*)    WBC, UA >50 (*)    Bacteria, UA RARE (*)    All other components within normal limits    EKG None  Radiology No results found.  Procedures Procedures (including critical care time)  Medications Ordered in ED Medications  ondansetron (ZOFRAN) injection 4 mg (4 mg Intravenous Given 09/10/19 0226)  HYDROmorphone (DILAUDID) injection 0.5 mg (0.5 mg Intravenous Given 09/10/19 0226)  ketorolac (TORADOL) 30 MG/ML injection 15 mg (15 mg Intravenous Given 09/10/19 0513)  ondansetron (ZOFRAN) injection 4 mg (4 mg Intravenous Given 09/10/19 0513)  sodium chloride 0.9 % bolus 500 mL (0 mLs Intravenous Stopped 09/10/19 0639)  ondansetron (ZOFRAN) injection 4 mg (4 mg Intravenous Given 09/10/19 [id number])    ED Course  I have reviewed the triage vital signs and the nursing notes.  Pertinent labs & imaging results that were available during my care of the patient were reviewed by me and considered in my medical decision making (see chart for details).    MDM Rules/Calculators/A&P                      69 year old male past medical history of kidney stones with a known  9 mm right-sided kidney stone presents for evaluation of worsening abdominal pain, flank pain, vomiting.  Seen in ED and ED earlier today and found to have right-sided 9 mm kidney stone.  Discharged home with pain medication and follow-up with urology at 1 PM.  Comes in today for worsening  pain, vomiting.  No fevers.  On initial ED arrival, he is afebrile, nontoxic-appearing.  Vital signs are stable.  He does have CVA tenderness noted on the right side as well as some right lower abdominal pain.  I reviewed his discharge paperwork from [facility] ED.  Unfortunately, he does not have a copy of his results so I do not know the exact location but his discharge paperwork did confirm 9 mm right-sided kidney stone.  No mention of what his kidney function was.  Plan for pain control, recheck labs.   I reviewed results from his previous visit at [facility].  His urine showed no leukocytes but did have some small amount of pyuria.  His creatinine at that time was 1.03.  CBC shows no leukocytosis here.  UA shows no leukocytes.  He does have pyuria noted in hemoglobin.  BMP shows BUN of 23, creatinine of 1.55. Patient given fluids.   Reevaluation.  Patient reports improvement in pain.  Plan to p.o. challenge.  At this time, no indication to repeat CT scan.  He has a follow-up appointment with urology.  Patient able to tolerate p.o. without any difficulty.  Repeat abdominal exam is improved.  Patient is hemodynamically stable.  Patient has follow-up appointment with Dr. Lovena Neighbours today at 1 PM.  At this time, feel that he is stable for discharge.  Instructed patient that he will need to follow-up with Dr. Lovena Neighbours as previously scheduled. At this time, patient exhibits no emergent life-threatening condition that require further evaluation in ED or admission. Discussed patient with Dr. Dina Rich who agrees with plan. Patient had ample opportunity for questions and discussion. All patient's questions were answered with full understanding. Strict return precautions discussed. Patient expresses understanding and agreement to plan.   Portions of this note were generated with [employer]. Dictation errors may occur despite best attempts at proofreading.   Final Clinical Impression(s) / ED  Diagnoses Final diagnoses:  Kidney stone    Rx / DC Orders ED Discharge Orders    None       Desma Mcgregor 09/10/19 [id number]    Merryl Hacker, MD 09/10/19 [phone]

## 2019-09-10 NOTE — Progress Notes (Addendum)
Anesthesia Review:  [id number] Ryan Vaughn  Cardiologist :Bronson Ing  Chest x-ray :12/15/18-epic  EKG :12/20/18-epic  Echo :7/20-epic  Cardiac Cath : 2020 epic  Sleep Study/ CPAP : Fasting Blood Sugar :      / Checks Blood Sugar -- times a day:   Blood Thinner/ Instructions /Last Dose: ASA / Instructions/ Last Dose :   Patient denies shortness of breath, chest pain, fever, and cough at this phone interview.

## 2019-09-11 NOTE — Progress Notes (Signed)
Anesthesia Chart Review   Case: [id number] Date/Time: 09/12/19 1400   Procedure: CYSTOSCOPY WITH RETROGRADE PYELOGRAM/URETERAL STENT PLACEMENT (Right )   Anesthesia type: General   Pre-op diagnosis: RIGHT URETEROPELVIC JUNCTION STONE   Location: [place] [facility] / [facility]   Surgeons: Ceasar Mons, MD      DISCUSSION:69 y.o. never smoker with h/o HTN, Non Hodgkin's Lymphoma, DM II, CAD (CABG 1992, DES 2015), right ureteropelvic junction stone scheduled for above procedure 09/12/2019 with Dr. Harrell Gave Lovena Neighbours.   Pt admitted with unstable angina 12/15/2018-12/17/2018. Underwent cardiac cath with patent LIMA-LAD, patent stents in the mRCA, and Lcx. Mild to moderate ISR of OM stent. Distal Lcx disease of 50% but not felt to be a culprit lesion. Normal LV function. No high grade stenosis identified.  Discharged with outpatient follow up.  Per cardiology follow up 01/13/2019 pt asymptomatic. Denies recurrent sx.    Anticipate pt can proceed with planned procedure barring acute status change.   VS: There were no vitals taken for this visit.  PROVIDERS: Curlene Labrum, MD is PCP   Kate Sable, MD is Cardiologist  LABS: SDW (all labs ordered are listed, but only abnormal results are displayed)  Labs Reviewed - No data to display   IMAGES:   EKG: 12/20/2018 Rate 66 bpm Sinus rhythm  Borderline left axis deviation  RSR' in V1 or V2, right VCD or RVH  CV: Echo 12/16/2018 IMPRESSIONS    1. The left ventricle has normal systolic function, with an ejection  fraction of 55-60%. The cavity size was mildly dilated. Left ventricular  diastolic Doppler parameters are consistent with impaired relaxation.  2. The right ventricle has normal systolic function. The cavity was  normal. There is no increase in right ventricular wall thickness.   Cardiac Cath 12/16/2018 1. Severe single vessel CAD with total occlusion of the proximal LAD and continued patency of the LIMA-LAD 2. Wide  patency of the RCA and stented segments of the mid-RCA 3. Patency of the stented segments in the LCx and first OM with mild-moderate ISR 4. Normal LV function  No high-grade stenosis identified. Favor ongoing medical therapy. Past Medical History:  Diagnosis Date  . Arthritis   . CAD (coronary artery disease)    a. DES to OM 1 and DES mLCX (12/05/13) b. CABG x1  ('92) c. 6 stents placed in 2009? (pt hx)   . Diabetes mellitus without complication ([place])   . Essential hypertension, benign   . Heart murmur    as a child   . History of kidney stones   . Kidney stones   . Lumbago   . Mixed hyperlipidemia   . Myocardial infarction ([place])   . Non Hodgkin's lymphoma ([place]) 2002   a. stage IV  . Personal history of colonic polyps     Past Surgical History:  Procedure Laterality Date  . CARDIAC CATHETERIZATION N/A 07/16/2015   Procedure: Left Heart Cath and Cors/Grafts Angiography;  Surgeon: Jettie Booze, MD;  Location: [place] [facility];  Service: Cardiovascular;  Laterality: N/A;  . CARDIAC CATHETERIZATION N/A 07/16/2015   Procedure: Coronary Stent Intervention;  Surgeon: Jettie Booze, MD;  Location: [facility];  Service: Cardiovascular;  Laterality: N/A;  . CHOLECYSTECTOMY  08/2000  . CORONARY ANGIOPLASTY WITH STENT PLACEMENT  ~ 1999; ~ 2009 X 2; 12/05/2013   "1 +3 + 2 + 2"  . [place]   "CABG X1"  . CYSTOSCOPY W/ URETEROSCOPY W/  LITHOTRIPSY    . CYSTOSTOMY W/ STENT INSERTION    . EXPLORATORY LAPAROTOMY WITH ABDOMINAL MASS EXCISION  08/2000   "looking for cancer"  . INGUINAL HERNIA REPAIR    . LEFT HEART CATH AND CORS/GRAFTS ANGIOGRAPHY N/A 12/16/2018   Procedure: LEFT HEART CATH AND CORS/GRAFTS ANGIOGRAPHY;  Surgeon: Sherren Mocha, MD;  Location: [place] [facility];  Service: Cardiovascular;  Laterality: N/A;  . LEFT HEART CATHETERIZATION WITH CORONARY ANGIOGRAM N/A 12/05/2013   Procedure: LEFT HEART CATHETERIZATION WITH CORONARY  ANGIOGRAM;  Surgeon: Blane Ohara, MD;  Location: [facility] CATH LAB;  Service: Cardiovascular;  Laterality: N/A;  . PERCUTANEOUS CORONARY STENT INTERVENTION (PCI-S)  12/05/2013   Procedure: PERCUTANEOUS CORONARY STENT INTERVENTION (PCI-S);  Surgeon: Blane Ohara, MD;  Location: [facility] CATH LAB;  Service: Cardiovascular;;  mid circumflex and prox OM2  . TONSILLECTOMY      MEDICATIONS: No current facility-administered medications for this encounter.   Marland Kitchen aspirin 81 MG tablet  . HYDROcodone-acetaminophen (NORCO/VICODIN) 5-325 MG tablet  . isosorbide mononitrate (IMDUR) 30 MG 24 hr tablet  . ketorolac (TORADOL) 10 MG tablet  . metFORMIN (GLUCOPHAGE) 500 MG tablet  . metoprolol succinate (TOPROL-XL) 50 MG 24 hr tablet  . nitroGLYCERIN (NITROSTAT) 0.4 MG SL tablet  . ondansetron (ZOFRAN) 4 MG tablet  . rosuvastatin (CRESTOR) 40 MG tablet  . tamsulosin (FLOMAX) 0.4 MG CAPS capsule  . XARELTO 2.5 MG TABS tablet    Maia Plan [facility] Pre-Surgical Testing [phone] 09/11/19  11:48 AM

## 2019-09-12 ENCOUNTER — Encounter (HOSPITAL_COMMUNITY)
Admission: RE | Disposition: A | Discharge status: Home / Self Care | Payer: Self-pay | Source: Home / Self Care | Attending: Urology

## 2019-09-12 ENCOUNTER — Ambulatory Visit (HOSPITAL_COMMUNITY)
Admission: RE | Admit: 2019-09-12 | Discharge: 2019-09-12 | Disposition: A | Discharge status: Home / Self Care | Payer: Medicare Other | Attending: Urology | Admitting: Urology

## 2019-09-12 ENCOUNTER — Other Ambulatory Visit: Payer: Self-pay

## 2019-09-12 ENCOUNTER — Ambulatory Visit (HOSPITAL_COMMUNITY): Payer: Medicare Other | Admitting: Physician Assistant

## 2019-09-12 ENCOUNTER — Encounter (HOSPITAL_COMMUNITY): Payer: Self-pay | Admitting: Urology

## 2019-09-12 ENCOUNTER — Ambulatory Visit (HOSPITAL_COMMUNITY): Payer: Medicare Other

## 2019-09-12 DIAGNOSIS — N179 Acute kidney failure, unspecified: Secondary | ICD-10-CM | POA: Insufficient documentation

## 2019-09-12 DIAGNOSIS — R011 Cardiac murmur, unspecified: Secondary | ICD-10-CM | POA: Diagnosis not present

## 2019-09-12 DIAGNOSIS — Z791 Long term (current) use of non-steroidal anti-inflammatories (NSAID): Secondary | ICD-10-CM | POA: Diagnosis not present

## 2019-09-12 DIAGNOSIS — E119 Type 2 diabetes mellitus without complications: Secondary | ICD-10-CM | POA: Insufficient documentation

## 2019-09-12 DIAGNOSIS — E782 Mixed hyperlipidemia: Secondary | ICD-10-CM | POA: Insufficient documentation

## 2019-09-12 DIAGNOSIS — Z951 Presence of aortocoronary bypass graft: Secondary | ICD-10-CM | POA: Diagnosis not present

## 2019-09-12 DIAGNOSIS — Z8572 Personal history of non-Hodgkin lymphomas: Secondary | ICD-10-CM | POA: Diagnosis not present

## 2019-09-12 DIAGNOSIS — Z87442 Personal history of urinary calculi: Secondary | ICD-10-CM | POA: Insufficient documentation

## 2019-09-12 DIAGNOSIS — Z7901 Long term (current) use of anticoagulants: Secondary | ICD-10-CM | POA: Diagnosis not present

## 2019-09-12 DIAGNOSIS — Z86010 Personal history of colonic polyps: Secondary | ICD-10-CM | POA: Diagnosis not present

## 2019-09-12 DIAGNOSIS — Z79899 Other long term (current) drug therapy: Secondary | ICD-10-CM | POA: Insufficient documentation

## 2019-09-12 DIAGNOSIS — N136 Pyonephrosis: Secondary | ICD-10-CM | POA: Insufficient documentation

## 2019-09-12 DIAGNOSIS — N132 Hydronephrosis with renal and ureteral calculous obstruction: Secondary | ICD-10-CM | POA: Diagnosis not present

## 2019-09-12 DIAGNOSIS — Z809 Family history of malignant neoplasm, unspecified: Secondary | ICD-10-CM | POA: Insufficient documentation

## 2019-09-12 DIAGNOSIS — I25110 Atherosclerotic heart disease of native coronary artery with unstable angina pectoris: Secondary | ICD-10-CM | POA: Diagnosis not present

## 2019-09-12 DIAGNOSIS — I25119 Atherosclerotic heart disease of native coronary artery with unspecified angina pectoris: Secondary | ICD-10-CM | POA: Insufficient documentation

## 2019-09-12 DIAGNOSIS — I2582 Chronic total occlusion of coronary artery: Secondary | ICD-10-CM | POA: Insufficient documentation

## 2019-09-12 DIAGNOSIS — I10 Essential (primary) hypertension: Secondary | ICD-10-CM | POA: Diagnosis not present

## 2019-09-12 DIAGNOSIS — E7800 Pure hypercholesterolemia, unspecified: Secondary | ICD-10-CM | POA: Diagnosis not present

## 2019-09-12 DIAGNOSIS — Z7984 Long term (current) use of oral hypoglycemic drugs: Secondary | ICD-10-CM | POA: Insufficient documentation

## 2019-09-12 DIAGNOSIS — Z7982 Long term (current) use of aspirin: Secondary | ICD-10-CM | POA: Insufficient documentation

## 2019-09-12 DIAGNOSIS — M1990 Unspecified osteoarthritis, unspecified site: Secondary | ICD-10-CM | POA: Diagnosis not present

## 2019-09-12 DIAGNOSIS — Z955 Presence of coronary angioplasty implant and graft: Secondary | ICD-10-CM | POA: Diagnosis not present

## 2019-09-12 DIAGNOSIS — I252 Old myocardial infarction: Secondary | ICD-10-CM | POA: Diagnosis not present

## 2019-09-12 DIAGNOSIS — N202 Calculus of kidney with calculus of ureter: Secondary | ICD-10-CM | POA: Diagnosis not present

## 2019-09-12 HISTORY — DX: Acute myocardial infarction, unspecified: I21.9

## 2019-09-12 HISTORY — PX: CYSTOSCOPY W/ URETERAL STENT PLACEMENT: SHX1429

## 2019-09-12 HISTORY — DX: Type 2 diabetes mellitus without complications: E11.9

## 2019-09-12 HISTORY — DX: Personal history of urinary calculi: Z87.442

## 2019-09-12 LAB — GLUCOSE, CAPILLARY: Glucose-Capillary: 104 mg/dL — ABNORMAL HIGH (ref 70–99)

## 2019-09-12 SURGERY — CYSTOSCOPY, WITH RETROGRADE PYELOGRAM AND URETERAL STENT INSERTION
Anesthesia: General | Laterality: Right

## 2019-09-12 MED ORDER — LIDOCAINE 2% (20 MG/ML) 5 ML SYRINGE
INTRAMUSCULAR | Status: DC | PRN
  Administered 2019-09-12: 80 mg via INTRAVENOUS

## 2019-09-12 MED ORDER — FENTANYL CITRATE (PF) 100 MCG/2ML IJ SOLN
INTRAMUSCULAR | Status: AC
  Filled 2019-09-12: qty 2

## 2019-09-12 MED ORDER — MIDAZOLAM HCL 2 MG/2ML IJ SOLN
INTRAMUSCULAR | Status: AC
  Filled 2019-09-12: qty 2

## 2019-09-12 MED ORDER — IOHEXOL 300 MG/ML  SOLN
INTRAMUSCULAR | Status: DC | PRN
  Administered 2019-09-12: 7 mL

## 2019-09-12 MED ORDER — PROPOFOL 10 MG/ML IV BOLUS
INTRAVENOUS | Status: DC | PRN
  Administered 2019-09-12: 150 mg via INTRAVENOUS

## 2019-09-12 MED ORDER — CEFAZOLIN SODIUM-DEXTROSE 2-4 GM/100ML-% IV SOLN
2.0000 g | Freq: Once | INTRAVENOUS | Status: AC
  Administered 2019-09-12: 2 g via INTRAVENOUS
  Filled 2019-09-12: qty 100

## 2019-09-12 MED ORDER — DEXAMETHASONE SODIUM PHOSPHATE 10 MG/ML IJ SOLN
INTRAMUSCULAR | Status: DC | PRN
  Administered 2019-09-12: 5 mg via INTRAVENOUS

## 2019-09-12 MED ORDER — ONDANSETRON HCL 4 MG/2ML IJ SOLN
INTRAMUSCULAR | Status: DC | PRN
  Administered 2019-09-12: 4 mg via INTRAVENOUS

## 2019-09-12 MED ORDER — EPHEDRINE 5 MG/ML INJ
INTRAVENOUS | Status: AC
  Filled 2019-09-12: qty 20

## 2019-09-12 MED ORDER — FENTANYL CITRATE (PF) 100 MCG/2ML IJ SOLN
INTRAMUSCULAR | Status: DC | PRN
  Administered 2019-09-12: 100 ug via INTRAVENOUS

## 2019-09-12 MED ORDER — LACTATED RINGERS IV SOLN: INTRAVENOUS | Status: DC

## 2019-09-12 MED ORDER — EPHEDRINE SULFATE-NACL 50-0.9 MG/10ML-% IV SOSY
PREFILLED_SYRINGE | INTRAVENOUS | Status: DC | PRN
  Administered 2019-09-12: 10 mg via INTRAVENOUS
  Administered 2019-09-12: 15 mg via INTRAVENOUS

## 2019-09-12 MED ORDER — MIDAZOLAM HCL 5 MG/5ML IJ SOLN
INTRAMUSCULAR | Status: DC | PRN
  Administered 2019-09-12: 2 mg via INTRAVENOUS

## 2019-09-12 MED ORDER — SODIUM CHLORIDE 0.9 % IR SOLN
Status: DC | PRN
  Administered 2019-09-12: 3000 mL via INTRAVESICAL

## 2019-09-12 SURGICAL SUPPLY — 12 items
BAG URO CATCHER STRL LF (MISCELLANEOUS) ×2 IMPLANT
CATH URET 5FR 28IN OPEN ENDED (CATHETERS) ×2 IMPLANT
CLOTH BEACON ORANGE TIMEOUT ST (SAFETY) ×2 IMPLANT
GLOVE BIOGEL M STRL SZ7.5 (GLOVE) ×2 IMPLANT
GOWN STRL REUS W/TWL XL LVL3 (GOWN DISPOSABLE) ×2 IMPLANT
GUIDEWIRE STR DUAL SENSOR (WIRE) ×1 IMPLANT
GUIDEWIRE ZIPWRE .038 STRAIGHT (WIRE) ×2 IMPLANT
KIT TURNOVER KIT A (KITS) ×1 IMPLANT
MANIFOLD NEPTUNE II (INSTRUMENTS) ×2 IMPLANT
STENT URET 6FRX24 CONTOUR (STENTS) ×1 IMPLANT
TRAY CYSTO PACK (CUSTOM PROCEDURE TRAY) ×2 IMPLANT
TUBING CONNECTING 10 (TUBING) ×2 IMPLANT

## 2019-09-12 NOTE — Anesthesia Procedure Notes (Signed)
Procedure Name: LMA Insertion Performed by: Ashdon Gillson J, CRNA Pre-anesthesia Checklist: Patient identified, Emergency Drugs available, Suction available, Patient being monitored and Timeout performed Patient Re-evaluated:Patient Re-evaluated prior to induction Oxygen Delivery Method: Circle system utilized Preoxygenation: Pre-oxygenation with 100% oxygen Induction Type: IV induction Ventilation: Mask ventilation without difficulty LMA: LMA inserted LMA Size: 4.0 Number of attempts: 1 Placement Confirmation: positive ETCO2 and breath sounds checked- equal and bilateral Tube secured with: Tape Dental Injury: Teeth and Oropharynx as per pre-operative assessment        

## 2019-09-12 NOTE — Anesthesia Preprocedure Evaluation (Addendum)
Anesthesia Evaluation  Patient identified by MRN, date of birth, ID band Patient awake    Reviewed: Allergy & Precautions, H&P , NPO status , Patient's Chart, lab work & pertinent test results, reviewed documented beta blocker date and time   Airway Mallampati: I  TM Distance: >3 FB Neck ROM: full    Dental no notable dental hx. (+) Teeth Intact, Dental Advisory Given, Edentulous Upper, Poor Dentition,    Pulmonary neg pulmonary ROS,    Pulmonary exam normal breath sounds clear to auscultation       Cardiovascular Exercise Tolerance: Good hypertension, + angina + CAD, + Past MI and + Cardiac Stents   Rhythm:regular Rate:Normal  Cath 20'  1. Severe single vessel CAD with total occlusion of the proximal LAD and continued patency of the LIMA-LAD 2. Wide patency of the RCA and stented segments of the mid-RCA 3. Patency of the stented segments in the LCx and first OM with mild-moderate ISR 4. Normal LV function No high-grade stenosis identified. Favor ongoing medical therapy.  EKG: 12/20/2018 Rate 66 bpm Sinus rhythm  Borderline left axis deviation  RSR' in V1 or V2, right VCD or RVH  CV: Echo 12/16/2018 IMPRESSIONS    1. The left ventricle has normal systolic function, with an ejection  fraction of 55-60%. The cavity size was mildly dilated. Left ventricular  diastolic Doppler parameters are consistent with impaired relaxation.  2. The right ventricle has normal systolic function. The cavity was  normal. There is no increase in right ventricular wall thickness.    Neuro/Psych negative neurological ROS  negative psych ROS   GI/Hepatic negative GI ROS, Neg liver ROS,   Endo/Other  negative endocrine ROSdiabetes  Renal/GU Renal disease  negative genitourinary   Musculoskeletal  (+) Arthritis , Osteoarthritis,    Abdominal   Peds  Hematology negative hematology ROS (+)   Anesthesia Other Findings    Reproductive/Obstetrics negative OB ROS                           Anesthesia Physical Anesthesia Plan  ASA: III  Anesthesia Plan: General   Post-op Pain Management:    Induction: Intravenous  PONV Risk Score and Plan: 2 and Ondansetron, Dexamethasone and Treatment may vary due to age or medical condition  Airway Management Planned: LMA and Oral ETT  Additional Equipment:   Intra-op Plan:   Post-operative Plan:   Informed Consent: I have reviewed the patients History and Physical, chart, labs and discussed the procedure including the risks, benefits and alternatives for the proposed anesthesia with the patient or authorized representative who has indicated his/her understanding and acceptance.     Dental Advisory Given  Plan Discussed with: CRNA, Surgeon and Anesthesiologist  Anesthesia Plan Comments: ( )       Anesthesia Quick Evaluation

## 2019-09-12 NOTE — Transfer of Care (Signed)
Immediate Anesthesia Transfer of Care Note  Patient: OCIE KUPER  Procedure(s) Performed: CYSTOSCOPY WITH RETROGRADE PYELOGRAM/URETERAL STENT PLACEMENT (Right )  Patient Location: PACU  Anesthesia Type:General  Level of Consciousness: sedated, patient cooperative and responds to stimulation  Airway & Oxygen Therapy: Patient Spontanous Breathing and Patient connected to face mask oxygen  Post-op Assessment: Report given to RN and Post -op Vital signs reviewed and stable  Post vital signs: Reviewed and stable  Last Vitals:  Vitals Value Taken Time  BP 119/76 09/12/19 1522  Temp    Pulse 64 09/12/19 1524  Resp 9 09/12/19 1524  SpO2 98 % 09/12/19 1524  Vitals shown include unvalidated device data.  Last Pain:  Vitals:   09/12/19 1300  TempSrc:   PainSc: 3       Patients Stated Pain Goal: 3 ([id number])  Complications: No apparent anesthesia complications

## 2019-09-12 NOTE — H&P (Signed)
Greg Norena  MRN: [id number]  DOB: [birth year], 69 year old Male  SSN:    PRIMARY CARE:  Judd Lien, MD  REFERRING:    PROVIDER:  Ellison Hughs, M.D.  LOCATION:  [facility] [phone]     --------------------------------------------------------------------------------   CC: I have pain in the flank.  HPI: Ryan Vaughn is a 69 year-old male patient who is here for flank pain.  The problem is on the right side. His pain started about approximately 07/21/2019. The pain is dull. The pain is constant. The pain does radiate.   Pain medications< makes the pain better. Nothing causes the pain to become worse. He was treated with the following pain medication(s): Toradol.   He has had this same pain previously. He has not had kidney stones.   -CT report from [facility] shows a 9 mm right UPJ stone mild hydronephrosis. He first presented with gross hematuria.  -Last kidney stone was 2002 and had ESWL at that time.  -He presented to [facility] last night for worsening flank pain and was given dilaudid, toradol, tamsulosin and zofran.  -Hx of CABG and coronary stents. He has not taken his xarelto in the last 2 days. Cardiologist- Dr. Bronson Ing  -Last serum creatinine- 1.55 (baseline is less than 1.0)  -Pain is well managed this afternoon and he denies vomiting throughout the day  -AFVSS     AUA Symptom Score: He never has the sensation of not emptying his bladder completely after finishing urinating. He never has to urinate again less that two hours after he has finished urinating. He does not have to stop and start again several times when he urinates. He never finds it difficult to postpone urination. He never has a weak urinary stream. He never has to push or strain to begin urination. He has to get up to urinate 2 times from the time he goes to bed until the time he gets up in the morning.   Calculated AUA Symptom Score: 2    ALLERGIES: None    Notes: Patient states  he had reaction to contrast dye with an MRI in the 1980s. He has had contrast dye since    MEDICATIONS: Ketorolac Tromethamine 10 mg tablet  Metoprolol Succinate 50 mg tablet, extended release 24 hr  Tamsulosin Hcl 0.4 mg capsule  Children's Aspirin 81 mg tablet,chewable  Hydrocodone-Acetaminophen 5 mg-325 mg tablet  Isosorbide Dinitrate 30 mg tablet  Metformin Er Osmotic 500 mg tablet, extended release 24 hr  Ondansetron Hcl 4 mg tablet  Rosuvastatin Calcium 40 mg tablet  Xarelto 2.5 mg tablet     GU PSH: None   NON-GU PSH: Cardiac Stent Placement Coronary Artery Bypass Grafting     GU PMH: None   NON-GU PMH: Cardiac murmur, unspecified Diabetes Type 2 Heart disease, unspecified Hypercholesterolemia Hypertension Myocardial Infarction Other specified types of non-Hodgkin lymphoma, unspecified site    FAMILY HISTORY: 3 daughters - Daughter nephrolithiasis - Mother    Notes: Mother deceased; heart issues  Father deceased; cancer   SOCIAL HISTORY: None   REVIEW OF SYSTEMS:    GU Review Male:   Patient denies frequent urination, hard to postpone urination, burning/ pain with urination, get up at night to urinate, leakage of urine, stream starts and stops, trouble starting your stream, have to strain to urinate , erection problems, and penile pain.  Gastrointestinal (Upper):   Patient reports nausea and vomiting. Patient denies indigestion/ heartburn.  Gastrointestinal (Lower):   Patient denies diarrhea and  constipation.  Constitutional:   Patient denies fever, night sweats, weight loss, and fatigue.  Skin:   Patient denies skin rash/ lesion and itching.  Eyes:   Patient denies blurred vision and double vision.  Ears/ Nose/ Throat:   Patient denies sore throat and sinus problems.  Hematologic/Lymphatic:   Patient denies swollen glands and easy bruising.  Cardiovascular:   Patient denies leg swelling and chest pains.  Respiratory:   Patient denies cough and shortness of  breath.  Endocrine:   Patient denies excessive thirst.  Musculoskeletal:   Patient reports back pain. Patient denies joint pain.  Neurological:   Patient denies headaches and dizziness.  Psychologic:   Patient denies depression and anxiety.   Notes: Gross hematuria    VITAL SIGNS:      09/10/2019 01:13 PM  Weight 225 lb / 102.06 kg  BP 107/70 mmHg  Pulse 70 /min  Temperature 98.4 F / 36.8 C   MULTI-SYSTEM PHYSICAL EXAMINATION:    Constitutional: Well-nourished. No physical deformities. Normally developed. Good grooming.  Neurologic / Psychiatric: Oriented to time, oriented to place, oriented to person. No depression, no anxiety, no agitation.  Musculoskeletal: Normal gait and station of head and neck.     PAST DATA REVIEW: None   PROCEDURES:          Urinalysis w/Scope Dipstick Dipstick Cont'd Micro  Color: Yellow Bilirubin: Neg mg/dL WBC/hpf: 6 - 10/hpf  Appearance: Clear Ketones: Neg mg/dL RBC/hpf: 10 - 20/hpf  Specific Gravity: 1.025 Blood: 3+ ery/uL Bacteria: Rare (0-9/hpf)  pH: 5.5 Protein: 1+ mg/dL Cystals: Ca Oxalate  Glucose: Neg mg/dL Urobilinogen: 0.2 mg/dL Casts: NS (Not Seen)    Nitrites: Neg Trichomonas: Not Present    Leukocyte Esterase: Trace leu/uL Mucous: Present      Epithelial Cells: NS (Not Seen)      Yeast: NS (Not Seen)      Sperm: Not Present    ASSESSMENT:      ICD-10 Details  1 GU:   Ureteral calculus - N20.1 Acute, Systemic Symptoms  2   Flank Pain - R10.84 Acute, Systemic Symptoms  4   Ureteral obstruction secondary to calculous - N13.2 Undiagnosed New Problem  3 NON-GU:   Pyuria/other UA findings - R82.79 Undiagnosed New Problem   PLAN:            Medications New Meds: Cephalexin 500 mg capsule 1 capsule PO BID   #14  0 Refill(s)            Orders Labs Urine Culture          Schedule Return Visit/Planned Activity: ASAP - Schedule Surgery          Document Letter(s):  Created for Patient: Clinical Summary         Notes:    -The risks, benefits and alternatives of cystoscopy with RIGHT JJ stent placement was discussed with the patient. Risks include, but are not limited to: bleeding, urinary tract infection, ureteral injury, ureteral stricture disease, chronic pain, urinary symptoms, bladder injury, stent migration, the need for nephrostomy tube placement, MI, CVA, DVT, PE and the inherent risks with general anesthesia. The patient voices understanding and wishes to proceed.  -Urine C&S pending. Will start him on an empiric course of keflex  - We discussed criteria to return to clinic or proceed to the ER which include: Fever/chills, worsening pain, nausea/vomiting and/or persistent gross hematuria.

## 2019-09-12 NOTE — Op Note (Signed)
Operative Note  Preoperative diagnosis:  1.  9 mm right UPJ calculus 2.  Urinary tract infection  Postoperative diagnosis: 1.  9 mm right UPJ calculus 2.  Urinary tract infection  Procedure(s): 1.  Cystoscopy with right JJ stent placement 2.  Right retrograde pyelogram with intraoperative interpretation of fluoroscopic imaging  Surgeon: Ellison Hughs, MD  Assistants:  None  Anesthesia:  General  Complications:  None  EBL: Less than 5 mL  Specimens: 1.  None  Drains/Catheters: 1.  Right 6 French, 24 cm JJ stent without tether  Intraoperative findings:   1. Right retrograde pyelogram revealed a 9 mm filling defect within the proximal aspects of the right ureter, consistent with the stone seen on his recent CT.  There were no other filling defects seen along the distal aspects of the right ureter.  The right renal pelvis was mildly dilated with no other significant filling defects.  Indication:  Ryan Vaughn is a 69 y.o. male with an obstructing 9 mm right UPJ calculus along with right-sided hydronephrosis, UTI and acute kidney injury.  He has been consented for the above procedures, voices understanding and wishes to proceed.  Description of procedure:  After informed consent was obtained, the patient was brought to the operating room and general LMA anesthesia was administered. The patient was then placed in the dorsolithotomy position and prepped and draped in the usual sterile fashion. A timeout was performed. A 23 French rigid cystoscope was then inserted into the urethral meatus and advanced into the bladder under direct vision. A complete bladder survey revealed no intravesical pathology.  A 5 French ureteral catheter was then inserted into the right ureteral orifice and a retrograde pyelogram was obtained, with the findings listed above.  A Glidewire was then used to intubate the lumen of the ureteral catheter and was advanced up to the right renal pelvis, under  fluoroscopic guidance.  The catheter was then removed, leaving the wire in place.  A 6 French, 24 cm JJ stent was then placed over the wire and into good position within the right collecting system, confirming placement via fluoroscopy.  The patient's bladder was drained.  He tolerated the procedure well and was transferred to the postanesthesia in stable condition.  Plan: Plan for definitive ureteroscopy in 2 weeks.

## 2019-09-14 NOTE — Anesthesia Postprocedure Evaluation (Signed)
Anesthesia Post Note  Patient: Ryan Vaughn  Procedure(s) Performed: CYSTOSCOPY WITH RETROGRADE PYELOGRAM/URETERAL STENT PLACEMENT (Right )     Patient location during evaluation: PACU Anesthesia Type: General Level of consciousness: awake and alert Pain management: pain level controlled Vital Signs Assessment: post-procedure vital signs reviewed and stable Respiratory status: spontaneous breathing, nonlabored ventilation, respiratory function stable and patient connected to nasal cannula oxygen Cardiovascular status: blood pressure returned to baseline and stable Postop Assessment: no apparent nausea or vomiting Anesthetic complications: no    Last Vitals:  Vitals:   09/12/19 1600 09/12/19 1605  BP: 112/73 117/80  Pulse: 60 63  Resp: 19 18  Temp:  36.4 C  SpO2: 96% 99%    Last Pain:  Vitals:   09/12/19 1605  TempSrc:   PainSc: 0-No pain                 Tikesha Mort

## 2019-09-16 ENCOUNTER — Encounter: Payer: Self-pay | Admitting: Cardiovascular Disease

## 2019-09-16 ENCOUNTER — Ambulatory Visit (INDEPENDENT_AMBULATORY_CARE_PROVIDER_SITE_OTHER): Payer: Medicare Other | Admitting: Cardiovascular Disease

## 2019-09-16 ENCOUNTER — Other Ambulatory Visit: Payer: Self-pay

## 2019-09-16 VITALS — BP 102/64 | HR 77 | Ht 71.0 in | Wt 217.0 lb

## 2019-09-16 DIAGNOSIS — I25708 Atherosclerosis of coronary artery bypass graft(s), unspecified, with other forms of angina pectoris: Secondary | ICD-10-CM | POA: Diagnosis not present

## 2019-09-16 DIAGNOSIS — E785 Hyperlipidemia, unspecified: Secondary | ICD-10-CM | POA: Diagnosis not present

## 2019-09-16 DIAGNOSIS — I10 Essential (primary) hypertension: Secondary | ICD-10-CM

## 2019-09-16 DIAGNOSIS — Z955 Presence of coronary angioplasty implant and graft: Secondary | ICD-10-CM

## 2019-09-16 MED ORDER — NITROGLYCERIN 0.4 MG SL SUBL: 0.4000 mg | SUBLINGUAL_TABLET | SUBLINGUAL | 3 refills | Status: DC | PRN

## 2019-09-16 NOTE — Patient Instructions (Signed)

## 2019-09-16 NOTE — Progress Notes (Signed)
SUBJECTIVE: Ryan Vaughn presents for follow-up of coronary artery disease.  He underwent cardiac catheterization on 12/16/2018 which is reviewed below in detail.  Medical therapy was recommended.  He has a history of one-vessel CABG with LIMA to the LAD in 1992, PCI to the left circumflex/obtuse marginal in 2009, and PCI to the mid RCA in 2017.  He underwent cystoscopy with right JJ stent placement on 09/12/2019.  The patient denies any symptoms of chest pain, palpitations, shortness of breath, lightheadedness, dizziness, leg swelling, orthopnea, PND, and syncope.  He has been working full-time at [employer].  He stopped taking Imdur about 2 to 3 weeks ago because he had been having recurrent headaches and diarrhea.  After stopping it symptoms resolved.  SocHx: His daughter, [name], is an Therapist, sports and works at [employer]. He has another daughter who is a respiratory therapist [employer]. Another daughter is a stay at home mother. He takes care of his girlfriend who had a stroke some years back.  Review of Systems: As per "subjective", otherwise negative.  Allergies  Allergen Reactions  . Ivp Dye [Iodinated Diagnostic Agents] Other (See Comments)    headache    Current Outpatient Medications  Medication Sig Dispense Refill  . aspirin 81 MG tablet Take 81 mg by mouth daily.    . cephALEXin (KEFLEX) 500 MG capsule Take 500 mg by mouth 2 (two) times daily. 7 day supply - started Wednesday 09/10/19    . HYDROcodone-acetaminophen (NORCO/VICODIN) 5-325 MG tablet Take 1 tablet by mouth every 4 (four) hours as needed for moderate pain.    Marland Kitchen ketorolac (TORADOL) 10 MG tablet Take 10 mg by mouth every 6 (six) hours as needed for moderate pain.    . metFORMIN (GLUCOPHAGE) 500 MG tablet Take 500 mg by mouth 2 (two) times daily with a meal.    . metoprolol succinate (TOPROL-XL) 50 MG 24 hr tablet Take 50 mg by mouth daily.    . nitroGLYCERIN (NITROSTAT) 0.4 MG SL tablet Place 1 tablet (0.4 mg  total) under the tongue every 5 (five) minutes x 3 doses as needed for chest pain. 25 tablet 3  . ondansetron (ZOFRAN) 4 MG tablet Take 4 mg by mouth every 6 (six) hours as needed for nausea or vomiting.    . rosuvastatin (CRESTOR) 40 MG tablet TAKE 1 TABLET(40 MG) BY MOUTH DAILY (Patient taking differently: Take 40 mg by mouth daily. ) 90 tablet 1  . tamsulosin (FLOMAX) 0.4 MG CAPS capsule Take 0.4 mg by mouth daily.    Alveda Reasons 2.5 MG TABS tablet TAKE 1 TABLET BY MOUTH TWICE DAILY (Patient taking differently: Take 2.5 mg by mouth 2 (two) times daily. ) 180 tablet 0   No current facility-administered medications for this visit.    Past Medical History:  Diagnosis Date  . Arthritis   . CAD (coronary artery disease)    a. DES to OM 1 and DES mLCX (12/05/13) b. CABG x1  ('92) c. 6 stents placed in 2009? (pt hx)   . Diabetes mellitus without complication ([place])   . Essential hypertension, benign   . Heart murmur    as a child   . History of kidney stones   . Kidney stones   . Lumbago   . Mixed hyperlipidemia   . Myocardial infarction ([place])   . Non Hodgkin's lymphoma ([place]) 2002   a. stage IV  . Personal history of colonic polyps     Past Surgical History:  Procedure Laterality Date  . CARDIAC CATHETERIZATION N/A 07/16/2015   Procedure: Left Heart Cath and Cors/Grafts Angiography;  Surgeon: Jettie Booze, MD;  Location: [place] [facility];  Service: Cardiovascular;  Laterality: N/A;  . CARDIAC CATHETERIZATION N/A 07/16/2015   Procedure: Coronary Stent Intervention;  Surgeon: Jettie Booze, MD;  Location: [place] [facility];  Service: Cardiovascular;  Laterality: N/A;  . CHOLECYSTECTOMY  08/2000  . CORONARY ANGIOPLASTY WITH STENT PLACEMENT  ~ 1999; ~ 2009 X 2; 12/05/2013   "1 +3 + 2 + 2"  . [place]   "CABG X1"  . CYSTOSCOPY W/ URETERAL STENT PLACEMENT Right 09/12/2019   Procedure: CYSTOSCOPY WITH RETROGRADE PYELOGRAM/URETERAL STENT PLACEMENT;   Surgeon: Ceasar Mons, MD;  Location: [facility];  Service: Urology;  Laterality: Right;  . CYSTOSCOPY W/ URETEROSCOPY W/ LITHOTRIPSY    . CYSTOSTOMY W/ STENT INSERTION    . EXPLORATORY LAPAROTOMY WITH ABDOMINAL MASS EXCISION  08/2000   "looking for cancer"  . INGUINAL HERNIA REPAIR    . LEFT HEART CATH AND CORS/GRAFTS ANGIOGRAPHY N/A 12/16/2018   Procedure: LEFT HEART CATH AND CORS/GRAFTS ANGIOGRAPHY;  Surgeon: Sherren Mocha, MD;  Location: [place] [facility];  Service: Cardiovascular;  Laterality: N/A;  . LEFT HEART CATHETERIZATION WITH CORONARY ANGIOGRAM N/A 12/05/2013   Procedure: LEFT HEART CATHETERIZATION WITH CORONARY ANGIOGRAM;  Surgeon: Blane Ohara, MD;  Location: [facility] CATH LAB;  Service: Cardiovascular;  Laterality: N/A;  . PERCUTANEOUS CORONARY STENT INTERVENTION (PCI-S)  12/05/2013   Procedure: PERCUTANEOUS CORONARY STENT INTERVENTION (PCI-S);  Surgeon: Blane Ohara, MD;  Location: [facility] CATH LAB;  Service: Cardiovascular;;  mid circumflex and prox OM2  . TONSILLECTOMY      Social History   Socioeconomic History  . Marital status: Divorced    Spouse name: Not on file  . Number of children: 3  . Years of education: 49  . Highest education level: Not on file  Occupational History  . Not on file  Tobacco Use  . Smoking status: Never Smoker  . Smokeless tobacco: Current User    Types: Snuff  . Tobacco comment: 12/05/2013 "use snuff q now and again"  Substance and Sexual Activity  . Alcohol use: Not Currently    Alcohol/week: 0.0 standard drinks  . Drug use: No  . Sexual activity: Yes  Other Topics Concern  . Not on file  Social History Narrative  . Not on file   Social Determinants of Health   Financial Resource Strain:   . Difficulty of Paying Living Expenses:   Food Insecurity:   . Worried About Charity fundraiser in the Last Year:   . Arboriculturist in the Last Year:   Transportation Needs:   . Film/video editor (Medical):   Marland Kitchen Lack of  Transportation (Non-Medical):   Physical Activity:   . Days of Exercise per Week:   . Minutes of Exercise per Session:   Stress:   . Feeling of Stress :   Social Connections:   . Frequency of Communication with Friends and Family:   . Frequency of Social Gatherings with Friends and Family:   . Attends Religious Services:   . Active Member of Clubs or Organizations:   . Attends Archivist Meetings:   Marland Kitchen Marital Status:   Intimate Partner Violence:   . Fear of Current or Ex-Partner:   . Emotionally Abused:   Marland Kitchen Physically Abused:   . Sexually Abused:  Vitals:   09/16/19 1446  BP: 102/64  Pulse: 77  SpO2: 97%  Weight: 217 lb (98.4 kg)  Height: 5\' 11"  (1.803 m)    Wt Readings from Last 3 Encounters:  09/16/19 217 lb (98.4 kg)  09/12/19 221 lb 9 oz (100.5 kg)  09/10/19 225 lb (102.1 kg)     PHYSICAL EXAM General: NAD HEENT: Normal. Neck: No JVD, no thyromegaly. Lungs: Clear to auscultation bilaterally with normal respiratory effort. CV: Regular rate and rhythm, normal S1/S2, no S3/S4, no murmur. No pretibial or periankle edema.  No carotid bruit.   Abdomen: Soft, nontender, no distention.  Neurologic: Alert and oriented.  Psych: Normal affect. Skin: Normal. Musculoskeletal: No gross deformities.      Labs: Lab Results  Component Value Date/Time   K 4.3 09/10/2019 02:28 AM   BUN 23 09/10/2019 02:28 AM   CREATININE 1.55 (H) 09/10/2019 02:28 AM   ALT 40 12/15/2018 03:25 PM   HGB 14.5 09/10/2019 02:28 AM     Lipids: Lab Results  Component Value Date/Time   LDLCALC UNABLE TO CALCULATE IF TRIGLYCERIDE OVER 400 mg/dL 07/15/2015 04:07 AM   CHOL 148 07/15/2015 04:07 AM   TRIG 473 (H) 07/15/2015 04:07 AM   HDL 28 (L) 07/15/2015 04:07 AM      Cath: 12/16/18  1. Severe single vessel CAD with total occlusion of the proximal LAD and continued patency of the LIMA-LAD 2. Wide patency of the RCA and stented segments of the mid-RCA 3. Patency of the  stented segments in the LCx and first OM with mild-moderate ISR 4. Normal LV function  No high-grade stenosis identified. Favor ongoing medical therapy.  TTE: 12/16/18  IMPRESSIONS   1. The left ventricle has normal systolic function, with an ejection fraction of 55-60%. The cavity size was mildly dilated. Left ventricular diastolic Doppler parameters are consistent with impaired relaxation. 2. The right ventricle has normal systolic function. The cavity was normal. There is no increase in right ventricular wall thickness.    ASSESSMENT AND PLAN:  1.  Coronary artery disease: S/p one-vessel CABG with LIMA to the LAD in 1992, PCI to the left circumflex/obtuse marginal in 2009, and PCI to the mid RCA in 2017. Symptomatically stable. Continue aspirin, metoprolol succinate, Xarelto 2.5 mg twice daily, and rosuvastatin.He stopped taking Imdur about 2 to 3 weeks ago because he had been having recurrent headaches and diarrhea.  After stopping it symptoms resolved.  2. Hypertension: Controlled on present therapy. No changes.  3. Hyperlipidemia: LDL 58 on 02/20/2019.  Continue rosuvastatin 40 mg.   Disposition: Follow up 1 yr   Kate Sable, M.D., F.A.C.C.

## 2019-09-18 ENCOUNTER — Telehealth: Payer: Self-pay | Admitting: Cardiovascular Disease

## 2019-09-18 ENCOUNTER — Other Ambulatory Visit: Payer: Self-pay | Admitting: Urology

## 2019-09-18 NOTE — Telephone Encounter (Signed)
   Primary Cardiologist: Kate Sable, MD  Chart reviewed as part of pre-operative protocol coverage. Seen by Dr. Bronson Ing 09/16/19 and was without cardiac complaints. Given past medical history and time since last visit, based on ACC/AHA guidelines, Ryan Vaughn would be at acceptable risk for the planned procedure without further cardiovascular testing.   I will route this recommendation to the requesting party via Epic fax function and remove from pre-op pool.  Please call with questions.  Abigail Butts, PA-C 09/18/2019, 9:14 AM

## 2019-09-18 NOTE — Telephone Encounter (Signed)
   [facility] Pre-operative Risk Assessment    Request for surgical clearance:  1. What type of surgery is being performed? Cystoscopy with right stent. Exchange Ureteroscopy, laser Lithotripsy and stent placement  2. When is this surgery scheduled? Clearance needed with 10-14 days  What type of clearance is required (medical clearance vs. Pharmacy clearance to hold med vs. Both) Medical 3. Are there any medications that need to be held prior to surgery and how long? Not stated  4. Practice name and name of physician performing surgery? [facility]   Dr Winter  5. What is your office phone number [phone]   7.   What is your office fax number [phone]  8.   Anesthesia type (None, local, MAC, general) ? Not stated   Ryan Vaughn 09/18/2019, 8:51 AM  _________________________________________________________________   (provider comments below)

## 2019-09-18 NOTE — Telephone Encounter (Signed)
Another fax came asking to hold Xarelto 3-5 days prior/ASA 81 mg

## 2019-09-19 NOTE — Telephone Encounter (Signed)
   Primary Cardiologist: Kate Sable, MD  Chart reviewed as part of pre-operative protocol coverage. Seen by Dr. Bronson Ing 09/16/19 and was without cardiac complaints. Given past medical history and time since last visit, based on ACC/AHA guidelines, Ryan Vaughn would be at acceptable risk for the planned procedure without further cardiovascular testing.   Per Dr. Bronson Ing, patient can hold xarelto 2 days prior to his procedure and aspirin 5 days prior to his procedure with plans to restart both medications when cleared to do so by his urologist.   I will route this recommendation to the requesting party via [place] fax function and remove from pre-op pool.  Please call with questions.  Abigail Butts, PA-C 09/19/2019, 8:18 AM

## 2019-09-19 NOTE — Telephone Encounter (Signed)
He can hold both ASA and Xarelto prior to urology procedure (2 days for Xarelto, 5 days for ASA).

## 2019-09-25 ENCOUNTER — Telehealth: Payer: Self-pay | Admitting: Cardiovascular Disease

## 2019-09-25 DIAGNOSIS — Z86010 Personal history of colonic polyps: Secondary | ICD-10-CM | POA: Diagnosis not present

## 2019-09-25 DIAGNOSIS — Z1211 Encounter for screening for malignant neoplasm of colon: Secondary | ICD-10-CM | POA: Diagnosis not present

## 2019-09-25 DIAGNOSIS — R58 Hemorrhage, not elsewhere classified: Secondary | ICD-10-CM | POA: Diagnosis not present

## 2019-09-25 NOTE — Telephone Encounter (Signed)
   [facility] Pre-operative Risk Assessment    Request for surgical clearance:  1. What type of surgery is being performed? Colonoscopy   2. When is this surgery scheduled? November 28, 2019  3. What type of clearance is required (medical clearance vs. Pharmacy clearance to hold med vs. Both)? Pharmacy  4. Are there any medications that need to be held prior to surgery and how long?asking to hold Xerelto 3 days prior to procedure   5. Practice name and name of physician performing surgery? [facility]  Dr Rocco Serene       Attn:  Gardiner Coins, LPN   6. What is your office phone number [phone]   7.   What is your office fax number [phone]  8.   Anesthesia type (None, local, MAC, general) ? Not listed   Weston Anna 09/25/2019, 2:50 PM  _________________________________________________________________   (provider comments below)

## 2019-09-26 ENCOUNTER — Other Ambulatory Visit: Payer: Self-pay

## 2019-09-26 ENCOUNTER — Encounter (HOSPITAL_BASED_OUTPATIENT_CLINIC_OR_DEPARTMENT_OTHER): Payer: Self-pay | Admitting: Urology

## 2019-09-26 NOTE — Telephone Encounter (Signed)
   Primary Cardiologist: Kate Sable, MD  Chart reviewed as part of pre-operative protocol coverage. We have been asked for guidance to hold xarelto for colonosocopy.  Per Dr. Bronson Ing, may hold xarelto for 2 days prior to his procedure. Please restart xarelto as soon as safe to do so, per surgeon.   I will route this recommendation to the requesting party via Epic fax function and remove from pre-op pool.  Please call with questions.  [place], PA 09/26/2019, 10:23 AM

## 2019-09-26 NOTE — Telephone Encounter (Signed)
Pt has been made aware of recommendations for clearance. Pt verbalized understanding and thanked me for the call.

## 2019-09-26 NOTE — Progress Notes (Addendum)
Addendum: Spoke with jessica zanetto pa ok to proceed.  Spoke w/ via phone for pre-op interview---patient Lab needs dos----  I stat 8            COVID test ------09-29-2019 at 905 ap Arrive at -------1200 pm 10-01-2019 No food after midnight clear liquids (no milk or dairy products) until 800 am then npo Medications to take morning of surgery -----rosuvastatin, metoprolol Diabetic medication -----none day of surgery Patient Special Instructions -----none Pre-Op special Istructions -----none Patient verbalized understanding of instructions that were given at this phone interview. Patient denies shortness of breath, chest pain, fever, cough a this phone interview.  Anesthesia : hx of mi, multiple stents, cabg, blood thinner Chart to jessica zanetto pa for review  PCP:dr steven burdine Cardiologist : dr Natividad Brood 09-16-2019 epic, cardiac clearance note Daleen Snook kroeger pa 09-18-2019 epic Chest x-ray :12-15-2018 epic EKG :12-16-2018 epic Echo : [id number] epic Cardiac Cath : last cath done 12-16-2018 epic Sleep Study/ CPAP :n/a Fasting Blood Sugar : 130     / Checks Blood Sugar - 4 x week Blood Thinner/ Instructions /Last Dose:stop xarelto 2 days before surgery per Daleen Snook kroeger pa note 09-18-2019 epic ASA / Instructions/ Last Dose : stop 81 mg aspirin 5 days prior to surgery per Daleen Snook kroeger pa note 09-18-2019 epic  Patient denies shortness of breath, chest pain, fever, and cough at this phone interview.

## 2019-09-29 ENCOUNTER — Other Ambulatory Visit (HOSPITAL_COMMUNITY)
Admission: RE | Admit: 2019-09-29 | Discharge: 2019-09-29 | Disposition: A | Discharge status: Home / Self Care | Payer: Medicare Other | Source: Ambulatory Visit | Attending: Urology | Admitting: Urology

## 2019-09-29 ENCOUNTER — Other Ambulatory Visit: Payer: Self-pay

## 2019-09-29 DIAGNOSIS — Z01812 Encounter for preprocedural laboratory examination: Secondary | ICD-10-CM | POA: Diagnosis not present

## 2019-09-29 DIAGNOSIS — Z20822 Contact with and (suspected) exposure to covid-19: Secondary | ICD-10-CM | POA: Insufficient documentation

## 2019-09-29 LAB — SARS CORONAVIRUS 2 (TAT 6-24 HRS): SARS Coronavirus 2: NEGATIVE

## 2019-10-01 ENCOUNTER — Ambulatory Visit (HOSPITAL_BASED_OUTPATIENT_CLINIC_OR_DEPARTMENT_OTHER): Payer: Medicare Other | Admitting: Physician Assistant

## 2019-10-01 ENCOUNTER — Encounter (HOSPITAL_BASED_OUTPATIENT_CLINIC_OR_DEPARTMENT_OTHER)
Admission: RE | Disposition: A | Discharge status: Home / Self Care | Payer: Self-pay | Source: Home / Self Care | Attending: Urology

## 2019-10-01 ENCOUNTER — Ambulatory Visit (HOSPITAL_BASED_OUTPATIENT_CLINIC_OR_DEPARTMENT_OTHER)
Admission: RE | Admit: 2019-10-01 | Discharge: 2019-10-01 | Disposition: A | Discharge status: Home / Self Care | Payer: Medicare Other | Attending: Urology | Admitting: Urology

## 2019-10-01 ENCOUNTER — Encounter (HOSPITAL_BASED_OUTPATIENT_CLINIC_OR_DEPARTMENT_OTHER): Payer: Self-pay | Admitting: Urology

## 2019-10-01 DIAGNOSIS — I25119 Atherosclerotic heart disease of native coronary artery with unspecified angina pectoris: Secondary | ICD-10-CM | POA: Diagnosis not present

## 2019-10-01 DIAGNOSIS — Z466 Encounter for fitting and adjustment of urinary device: Secondary | ICD-10-CM | POA: Diagnosis not present

## 2019-10-01 DIAGNOSIS — Z955 Presence of coronary angioplasty implant and graft: Secondary | ICD-10-CM | POA: Diagnosis not present

## 2019-10-01 DIAGNOSIS — E669 Obesity, unspecified: Secondary | ICD-10-CM | POA: Insufficient documentation

## 2019-10-01 DIAGNOSIS — Z8572 Personal history of non-Hodgkin lymphomas: Secondary | ICD-10-CM | POA: Diagnosis not present

## 2019-10-01 DIAGNOSIS — I10 Essential (primary) hypertension: Secondary | ICD-10-CM | POA: Diagnosis not present

## 2019-10-01 DIAGNOSIS — E119 Type 2 diabetes mellitus without complications: Secondary | ICD-10-CM | POA: Insufficient documentation

## 2019-10-01 DIAGNOSIS — Z7901 Long term (current) use of anticoagulants: Secondary | ICD-10-CM | POA: Insufficient documentation

## 2019-10-01 DIAGNOSIS — M1990 Unspecified osteoarthritis, unspecified site: Secondary | ICD-10-CM | POA: Diagnosis not present

## 2019-10-01 DIAGNOSIS — Z9049 Acquired absence of other specified parts of digestive tract: Secondary | ICD-10-CM | POA: Diagnosis not present

## 2019-10-01 DIAGNOSIS — Z87440 Personal history of urinary (tract) infections: Secondary | ICD-10-CM | POA: Diagnosis not present

## 2019-10-01 DIAGNOSIS — Z6831 Body mass index (BMI) 31.0-31.9, adult: Secondary | ICD-10-CM | POA: Diagnosis not present

## 2019-10-01 DIAGNOSIS — I25110 Atherosclerotic heart disease of native coronary artery with unstable angina pectoris: Secondary | ICD-10-CM | POA: Diagnosis not present

## 2019-10-01 DIAGNOSIS — Z888 Allergy status to other drugs, medicaments and biological substances status: Secondary | ICD-10-CM | POA: Diagnosis not present

## 2019-10-01 DIAGNOSIS — Z86010 Personal history of colonic polyps: Secondary | ICD-10-CM | POA: Diagnosis not present

## 2019-10-01 DIAGNOSIS — N201 Calculus of ureter: Secondary | ICD-10-CM | POA: Insufficient documentation

## 2019-10-01 DIAGNOSIS — Z87442 Personal history of urinary calculi: Secondary | ICD-10-CM | POA: Insufficient documentation

## 2019-10-01 DIAGNOSIS — I252 Old myocardial infarction: Secondary | ICD-10-CM | POA: Insufficient documentation

## 2019-10-01 DIAGNOSIS — Z803 Family history of malignant neoplasm of breast: Secondary | ICD-10-CM | POA: Diagnosis not present

## 2019-10-01 DIAGNOSIS — Z91041 Radiographic dye allergy status: Secondary | ICD-10-CM | POA: Insufficient documentation

## 2019-10-01 DIAGNOSIS — Z951 Presence of aortocoronary bypass graft: Secondary | ICD-10-CM | POA: Insufficient documentation

## 2019-10-01 DIAGNOSIS — E782 Mixed hyperlipidemia: Secondary | ICD-10-CM | POA: Diagnosis not present

## 2019-10-01 DIAGNOSIS — Z9221 Personal history of antineoplastic chemotherapy: Secondary | ICD-10-CM | POA: Insufficient documentation

## 2019-10-01 DIAGNOSIS — Z800 Family history of malignant neoplasm of digestive organs: Secondary | ICD-10-CM | POA: Diagnosis not present

## 2019-10-01 HISTORY — PX: CYSTOSCOPY/URETEROSCOPY/HOLMIUM LASER/STENT PLACEMENT: SHX6546

## 2019-10-01 LAB — GLUCOSE, CAPILLARY: Glucose-Capillary: 105 mg/dL — ABNORMAL HIGH (ref 70–99)

## 2019-10-01 LAB — POCT I-STAT, CHEM 8
BUN: 9 mg/dL (ref 8–23)
Calcium, Ion: 1.32 mmol/L (ref 1.15–1.40)
Chloride: 100 mmol/L (ref 98–111)
Creatinine, Ser: 1 mg/dL (ref 0.61–1.24)
Glucose, Bld: 120 mg/dL — ABNORMAL HIGH (ref 70–99)
HCT: 46 % (ref 39.0–52.0)
Hemoglobin: 15.6 g/dL (ref 13.0–17.0)
Potassium: 5.1 mmol/L (ref 3.5–5.1)
Sodium: 139 mmol/L (ref 135–145)
TCO2: 30 mmol/L (ref 22–32)

## 2019-10-01 SURGERY — CYSTOSCOPY/URETEROSCOPY/HOLMIUM LASER/STENT PLACEMENT
Anesthesia: General | Site: Ureter | Laterality: Right

## 2019-10-01 MED ORDER — LIDOCAINE 2% (20 MG/ML) 5 ML SYRINGE
INTRAMUSCULAR | Status: AC
  Filled 2019-10-01: qty 5

## 2019-10-01 MED ORDER — PROPOFOL 10 MG/ML IV BOLUS
INTRAVENOUS | Status: AC
  Filled 2019-10-01: qty 20

## 2019-10-01 MED ORDER — FENTANYL CITRATE (PF) 100 MCG/2ML IJ SOLN
INTRAMUSCULAR | Status: DC | PRN
  Administered 2019-10-01 (×2): 25 ug via INTRAVENOUS
  Administered 2019-10-01: 50 ug via INTRAVENOUS

## 2019-10-01 MED ORDER — PHENAZOPYRIDINE HCL 100 MG PO TABS
ORAL_TABLET | ORAL | Status: AC
  Filled 2019-10-01: qty 2

## 2019-10-01 MED ORDER — DEXAMETHASONE SODIUM PHOSPHATE 10 MG/ML IJ SOLN
INTRAMUSCULAR | Status: AC
  Filled 2019-10-01: qty 1

## 2019-10-01 MED ORDER — DEXAMETHASONE SODIUM PHOSPHATE 10 MG/ML IJ SOLN
INTRAMUSCULAR | Status: DC | PRN
  Administered 2019-10-01: 10 mg via INTRAVENOUS

## 2019-10-01 MED ORDER — FENTANYL CITRATE (PF) 100 MCG/2ML IJ SOLN: 25.0000 ug | INTRAMUSCULAR | Status: DC | PRN

## 2019-10-01 MED ORDER — PHENAZOPYRIDINE HCL 100 MG PO TABS
200.0000 mg | ORAL_TABLET | Freq: Once | ORAL | Status: AC
  Administered 2019-10-01: 200 mg via ORAL

## 2019-10-01 MED ORDER — LACTATED RINGERS IV SOLN: INTRAVENOUS | Status: DC

## 2019-10-01 MED ORDER — PROPOFOL 10 MG/ML IV BOLUS
INTRAVENOUS | Status: DC | PRN
  Administered 2019-10-01: 180 mg via INTRAVENOUS

## 2019-10-01 MED ORDER — SODIUM CHLORIDE 0.9 % IR SOLN
Status: DC | PRN
  Administered 2019-10-01: 6000 mL

## 2019-10-01 MED ORDER — ONDANSETRON HCL 4 MG/2ML IJ SOLN
INTRAMUSCULAR | Status: DC | PRN
  Administered 2019-10-01: 4 mg via INTRAVENOUS

## 2019-10-01 MED ORDER — OXYCODONE HCL 5 MG PO TABS
ORAL_TABLET | ORAL | Status: AC
  Filled 2019-10-01: qty 1

## 2019-10-01 MED ORDER — ONDANSETRON HCL 4 MG/2ML IJ SOLN
INTRAMUSCULAR | Status: AC
  Filled 2019-10-01: qty 2

## 2019-10-01 MED ORDER — HYDROCODONE-ACETAMINOPHEN 5-325 MG PO TABS: 1.0000 | ORAL_TABLET | ORAL | 0 refills | Status: DC | PRN

## 2019-10-01 MED ORDER — OXYCODONE HCL 5 MG/5ML PO SOLN: 5.0000 mg | Freq: Once | ORAL | Status: AC | PRN

## 2019-10-01 MED ORDER — MEPERIDINE HCL 25 MG/ML IJ SOLN: 6.2500 mg | INTRAMUSCULAR | Status: DC | PRN

## 2019-10-01 MED ORDER — IOHEXOL 300 MG/ML  SOLN
INTRAMUSCULAR | Status: DC | PRN
  Administered 2019-10-01: 50 mL

## 2019-10-01 MED ORDER — PHENAZOPYRIDINE HCL 200 MG PO TABS
200.0000 mg | ORAL_TABLET | Freq: Three times a day (TID) | ORAL | 0 refills | Status: DC | PRN
Start: 2019-10-01 — End: 2019-12-20

## 2019-10-01 MED ORDER — CEFAZOLIN SODIUM-DEXTROSE 2-4 GM/100ML-% IV SOLN
INTRAVENOUS | Status: AC
  Filled 2019-10-01: qty 100

## 2019-10-01 MED ORDER — KETOROLAC TROMETHAMINE 30 MG/ML IJ SOLN
INTRAMUSCULAR | Status: AC
  Filled 2019-10-01: qty 1

## 2019-10-01 MED ORDER — CEPHALEXIN 500 MG PO CAPS
500.0000 mg | ORAL_CAPSULE | Freq: Two times a day (BID) | ORAL | 0 refills | Status: AC
Start: 2019-10-01 — End: 2019-10-04

## 2019-10-01 MED ORDER — BELLADONNA ALKALOIDS-OPIUM 16.2-60 MG RE SUPP
RECTAL | Status: AC
  Filled 2019-10-01: qty 1

## 2019-10-01 MED ORDER — LIDOCAINE 2% (20 MG/ML) 5 ML SYRINGE
INTRAMUSCULAR | Status: DC | PRN
  Administered 2019-10-01: 100 mg via INTRAVENOUS

## 2019-10-01 MED ORDER — ONDANSETRON HCL 4 MG/2ML IJ SOLN: 4.0000 mg | Freq: Once | INTRAMUSCULAR | Status: DC | PRN

## 2019-10-01 MED ORDER — CEFAZOLIN SODIUM-DEXTROSE 2-4 GM/100ML-% IV SOLN
2.0000 g | Freq: Once | INTRAVENOUS | Status: AC
  Administered 2019-10-01: 2 g via INTRAVENOUS

## 2019-10-01 MED ORDER — KETOROLAC TROMETHAMINE 30 MG/ML IJ SOLN
INTRAMUSCULAR | Status: DC | PRN
Start: 2019-10-01 — End: 2019-10-01
  Administered 2019-10-01: 15 mg via INTRAVENOUS

## 2019-10-01 MED ORDER — OXYCODONE HCL 5 MG PO TABS
5.0000 mg | ORAL_TABLET | Freq: Once | ORAL | Status: AC | PRN
  Administered 2019-10-01: 5 mg via ORAL

## 2019-10-01 MED ORDER — MIDAZOLAM HCL 2 MG/2ML IJ SOLN
INTRAMUSCULAR | Status: AC
  Filled 2019-10-01: qty 2

## 2019-10-01 MED ORDER — MIDAZOLAM HCL 5 MG/5ML IJ SOLN
INTRAMUSCULAR | Status: DC | PRN
  Administered 2019-10-01: 2 mg via INTRAVENOUS

## 2019-10-01 MED ORDER — BELLADONNA ALKALOIDS-OPIUM 16.2-60 MG RE SUPP
RECTAL | Status: DC | PRN
  Administered 2019-10-01: 1 via RECTAL

## 2019-10-01 MED ORDER — FENTANYL CITRATE (PF) 100 MCG/2ML IJ SOLN
INTRAMUSCULAR | Status: AC
  Filled 2019-10-01: qty 2

## 2019-10-01 MED ORDER — TAMSULOSIN HCL 0.4 MG PO CAPS
0.4000 mg | ORAL_CAPSULE | Freq: Every day | ORAL | 11 refills | Status: DC
Start: 2019-10-01 — End: 2019-12-20

## 2019-10-01 SURGICAL SUPPLY — 25 items
APL SKNCLS STERI-STRIP NONHPOA (GAUZE/BANDAGES/DRESSINGS)
BAG DRAIN URO-CYSTO SKYTR STRL (DRAIN) ×2 IMPLANT
BAG DRN UROCATH (DRAIN) ×1
BASKET STONE 1.7 NGAGE (UROLOGICAL SUPPLIES) IMPLANT
BASKET ZERO TIP NITINOL 2.4FR (BASKET) ×2 IMPLANT
BENZOIN TINCTURE PRP APPL 2/3 (GAUZE/BANDAGES/DRESSINGS) IMPLANT
BSKT STON RTRVL ZERO TP 2.4FR (BASKET) ×1
CATH URET 5FR 28IN OPEN ENDED (CATHETERS) ×1 IMPLANT
CLOTH BEACON ORANGE TIMEOUT ST (SAFETY) ×2 IMPLANT
FIBER LASER FLEXIVA 365 (UROLOGICAL SUPPLIES) IMPLANT
FIBER LASER TRAC TIP (UROLOGICAL SUPPLIES) ×1 IMPLANT
GLOVE BIO SURGEON STRL SZ7.5 (GLOVE) ×2 IMPLANT
GOWN STRL REUS W/TWL XL LVL3 (GOWN DISPOSABLE) ×2 IMPLANT
GUIDEWIRE STR DUAL SENSOR (WIRE) ×1 IMPLANT
GUIDEWIRE ZIPWRE .038 STRAIGHT (WIRE) ×2 IMPLANT
IV NS IRRIG 3000ML ARTHROMATIC (IV SOLUTION) ×4 IMPLANT
KIT TURNOVER CYSTO (KITS) ×2 IMPLANT
MANIFOLD NEPTUNE II (INSTRUMENTS) ×2 IMPLANT
NS IRRIG 500ML POUR BTL (IV SOLUTION) ×2 IMPLANT
SHEATH URET ACCESS 12FR/35CM (UROLOGICAL SUPPLIES) ×1 IMPLANT
STRIP CLOSURE SKIN 1/2X4 (GAUZE/BANDAGES/DRESSINGS) IMPLANT
SYR 10ML LL (SYRINGE) ×2 IMPLANT
TRAY CYSTO PACK (CUSTOM PROCEDURE TRAY) ×2 IMPLANT
TUBE CONNECTING 12X1/4 (SUCTIONS) IMPLANT
TUBING UROLOGY SET (TUBING) ×2 IMPLANT

## 2019-10-01 NOTE — Discharge Instructions (Signed)
  Post Anesthesia Home Care Instructions  Activity: Get plenty of rest for the remainder of the day. A responsible adult should stay with you for 24 hours following the procedure.  For the next 24 hours, DO NOT: -Drive a car -Paediatric nurse -Drink alcoholic beverages -Take any medication unless instructed by your physician -Make any legal decisions or sign important papers.  Meals: Start with liquid foods such as gelatin or soup. Progress to regular foods as tolerated. Avoid greasy, spicy, heavy foods. If nausea and/or vomiting occur, drink only clear liquids until the nausea and/or vomiting subsides. Call your physician if vomiting continues.  Special Instructions/Symptoms: Your throat may feel dry or sore from the anesthesia or the breathing tube placed in your throat during surgery. If this causes discomfort, gargle with warm salt water. The discomfort should disappear within 24 hours.  If you had a scopolamine patch placed behind your ear for the management of post- operative nausea and/or vomiting:  1. The medication in the patch is effective for 72 hours, after which it should be removed.  Wrap patch in a tissue and discard in the trash. Wash hands thoroughly with soap and water. 2. You may remove the patch earlier than 72 hours if you experience unpleasant side effects which may include dry mouth, dizziness or visual disturbances. 3. Avoid touching the patch. Wash your hands with soap and water after contact with the patch.   No ibuprofen, motrin, advil, aleve until 6:30 pm today if needed

## 2019-10-01 NOTE — H&P (Signed)
Urology Preoperative H&P   Chief Complaint:  Kidney stone  History of Present Illness: Ryan Vaughn is a 69 y.o. male with a 9 mm right UPJ stone.  He is s/p right JJ stent placement on 09/12/19 due to his obstructing stone and concomitant UTI.  He is here today for definitive stone treatment.  He denies interval UTIs, dysuria or hematuria.    Past Medical History:  Diagnosis Date  . Arthritis   . CAD (coronary artery disease)    a. DES to OM 1 and DES mLCX (12/05/13) b. CABG x1  ('92) c. 6 stents placed in 2009? (pt hx)   . Diabetes mellitus without complication ([place])    type 2  . Essential hypertension, benign   . Heart murmur    as a child   . History of kidney stones   . Lumbago   . Mixed hyperlipidemia   . Myocardial infarction [facility]) age 13  . Non Hodgkin's lymphoma ([place]) 2002   a. stage IV, chemo done  . Personal history of colonic polyps     Past Surgical History:  Procedure Laterality Date  . CARDIAC CATHETERIZATION N/A 07/16/2015   Procedure: Left Heart Cath and Cors/Grafts Angiography;  Surgeon: Jettie Booze, MD;  Location: [place] [facility];  Service: Cardiovascular;  Laterality: N/A;  . CARDIAC CATHETERIZATION N/A 07/16/2015   Procedure: Coronary Stent Intervention;  Surgeon: Jettie Booze, MD;  Location: [place] [facility];  Service: Cardiovascular;  Laterality: N/A;  . CHOLECYSTECTOMY  08/2000  . CORONARY ANGIOPLASTY WITH STENT PLACEMENT  ~ 1999; ~ 2009 X 2; 12/05/2013   "1 +3 + 2 + 2" ( total of 8 or 9 stents)  . CORONARY ARTERY BYPASS GRAFT  1992   "CABG X1"  . CYSTOSCOPY W/ URETERAL STENT PLACEMENT Right 09/12/2019   Procedure: CYSTOSCOPY WITH RETROGRADE PYELOGRAM/URETERAL STENT PLACEMENT;  Surgeon: Ceasar Mons, MD;  [facility];  Service: Urology;  Laterality: Right;  . EXPLORATORY LAPAROTOMY WITH ABDOMINAL MASS EXCISION  08/2000   "looking for cancer"  . INGUINAL HERNIA REPAIR    . LEFT HEART CATH AND CORS/GRAFTS ANGIOGRAPHY N/A  12/16/2018   Procedure: LEFT HEART CATH AND CORS/GRAFTS ANGIOGRAPHY;  Surgeon: Sherren Mocha, MD;  Location: [place] [facility];  Service: Cardiovascular;  Laterality: N/A;  . LEFT HEART CATHETERIZATION WITH CORONARY ANGIOGRAM N/A 12/05/2013   Procedure: LEFT HEART CATHETERIZATION WITH CORONARY ANGIOGRAM;  Surgeon: Blane Ohara, MD;  Location: [facility] CATH LAB;  Service: Cardiovascular;  Laterality: N/A;  . PERCUTANEOUS CORONARY STENT INTERVENTION (PCI-S)  12/05/2013   Procedure: PERCUTANEOUS CORONARY STENT INTERVENTION (PCI-S);  Surgeon: Blane Ohara, MD;  Location: [facility] CATH LAB;  Service: Cardiovascular;;  mid circumflex and prox OM2  . TONSILLECTOMY      Allergies:  Allergies  Allergen Reactions  . Imdur [Isosorbide Nitrate] Diarrhea and Other (See Comments)    Headache, diarrhea  . Ivp Dye [Iodinated Diagnostic Agents] Other (See Comments)    headache    Family History  Problem Relation Age of Onset  . Breast cancer Mother   . Colon cancer Father     Social History:  reports that he has never smoked. His smokeless tobacco use includes snuff. He reports previous alcohol use. He reports that he does not use drugs.  ROS: A complete review of systems was performed.  All systems are negative except for pertinent findings as noted.  Physical Exam:  Vital signs in last 24 hours:   Constitutional:  Alert and oriented, No acute distress Cardiovascular: Regular rate and rhythm, No JVD Respiratory: Normal respiratory effort, Lungs clear bilaterally GI: Abdomen is soft, nontender, nondistended, no abdominal masses GU: No CVA tenderness Lymphatic: No lymphadenopathy Neurologic: Grossly intact, no focal deficits Psychiatric: Normal mood and affect  Laboratory Data:  No results for input(s): WBC, HGB, HCT, PLT in the last 72 hours.  No results for input(s): NA, K, CL, GLUCOSE, BUN, CALCIUM, CREATININE in the last 72 hours.  Invalid input(s): CO3   No results found for this or  any previous visit (from the past 24 hour(s)). Recent Results (from the past 240 hour(s))  SARS CORONAVIRUS 2 (TAT 6-24 HRS) Nasopharyngeal Nasopharyngeal Swab     Status: None   Collection Time: 09/29/19  8:42 AM   Specimen: Nasopharyngeal Swab  Result Value Ref Range Status   SARS Coronavirus 2 NEGATIVE NEGATIVE Final    Comment: (NOTE) SARS-CoV-2 target nucleic acids are NOT DETECTED. The SARS-CoV-2 RNA is generally detectable in upper and lower respiratory specimens during the acute phase of infection. Negative results do not preclude SARS-CoV-2 infection, do not rule out co-infections with other pathogens, and should not be used as the sole basis for treatment or other patient management decisions. Negative results must be combined with clinical observations, patient history, and epidemiological information. The expected result is Negative. Fact Sheet for Patients: SugarRoll.be Fact Sheet for Healthcare Providers: https://www.woods-mathews.com/ This test is not yet approved or cleared by the [place] FDA and  has been authorized for detection and/or diagnosis of SARS-CoV-2 by FDA under an Emergency Use Authorization (EUA). This EUA will remain  in effect (meaning this test can be used) for the duration of the COVID-19 declaration under Section 56 4(b)(1) of the Act, 21 U.S.C. section 360bbb-3(b)(1), unless the authorization is terminated or revoked sooner. Performed at [facility], [place] [address], [place], [place] [address]     Renal Function: No results for input(s): CREATININE in the last 168 hours. CrCl cannot be calculated (Patient's most recent lab result is older than the maximum 21 days allowed.).  Radiologic Imaging: No results found.  I independently reviewed the above imaging studies.  Assessment and Plan [name] is a 69 y.o. male with a 9 mm RIGHT UPJ stone, s/p stent placement on 09/12/19.   The  risks, benefits and alternatives of cystoscopy with RIGHT ureteroscopy, laser lithotripsy and ureteral stent placement was discussed the patient.  Risks included, but are not limited to: bleeding, urinary tract infection, ureteral injury/avulsion, ureteral stricture formation, retained stone fragments, the possibility that multiple surgeries may be required to treat the stone(s), MI, stroke, PE and the inherent risks of general anesthesia.  The patient voices understanding and wishes to proceed.      Ellison Hughs, MD 10/01/2019, 6:27 AM  [facility] Pager: [phone]

## 2019-10-01 NOTE — Op Note (Addendum)
Operative Note  Preoperative diagnosis:  1.  9 mm right UPJ calculus  Postoperative diagnosis: 1.  Obstructing and impacted 9 mm right UPJ calculus  Procedure(s): 1.  Cystoscopy with right JJ stent exchange, right ureteroscopy, holmium laser lithotripsy and right JJ stent placement  Surgeon: Ellison Hughs, MD  Assistants:  None  Anesthesia:  General  Complications:  None  EBL: 10 mL  Specimens: 1.  Right UPJ stone fragments 2.  Previously placed right JJ stent was removed intact, inspected and discarded  Drains/Catheters: 1.  Right 6 French, 24 cm JJ stent without tether  Intraoperative findings:   1. Obstructing and impacted 9 mm right UPJ calculus  Indication:  Ryan Vaughn is a 69 y.o. male with an obstructing 9 mm right UPJ calculus.  He underwent right JJ stent placement on 09/12/2019 due to his obstructing stone.  He is here today for definitive stone treatment.  He has been consented for the above procedures, voices understanding wishes to proceed.  Description of procedure:  After informed consent was obtained, the patient was brought to the operating room and general LMA anesthesia was administered. The patient was then placed in the dorsolithotomy position and prepped and draped in the usual sterile fashion. A timeout was performed. A 23 French rigid cystoscope was then inserted into the urethral meatus and advanced into the bladder under direct vision. A complete bladder survey revealed no intravesical pathology.  His previously placed right JJ stent was then grasped at its distal curl and retracted to the urethral meatus.  A Glidewire was then advanced up the lumen of the stent and up to the right renal pelvis, under fluoroscopic guidance.  The stent was then removed over the wire intact, inspected and discarded.  An additional sensor wire was then advanced up the right ureter and up to the right renal pelvis, or fluoroscopic guidance.  A 14 French, 35 cm  ureteral access sheath was then advanced over the sensor wire and into position within the proximal aspects of the right ureter.  A flexible ureteroscope was then advanced through the lumen of the access sheath where his stone was quickly identified.  The stone was severely impacted into the wall of the proximal right ureter.  A 200 m holmium laser was then used to fracture the stone into several smaller pieces.  The majority of the pieces were then extracted using a 0 tip basket.  A few smaller pieces migrated into an upper pole calyx and were subsequently dusted using the holmium laser.  The flexible ureteroscope and ureteral access sheath were then removed under direct vision.  There was circumferential ureteral edema surrounding the site that the stone was impacted in, but no other signs of trauma or stricture.    The 23 French rigid cystoscope was then reinserted over the zip wire and into the bladder.  A 6 French, 24 cm JJ stent was then placed over the wire and into good position within the right collecting system, confirming placement via fluoroscopy.  The patient's bladder was drained.  He had a moderate amount of clot in his bladder at the conclusion of the case that was evacuated through the sheath of the cystoscope.  He tolerated the procedure well and was transferred to the postanesthesia in stable condition.   Plan: Follow-up on 10/08/2019 for stent removal in the office

## 2019-10-01 NOTE — Anesthesia Preprocedure Evaluation (Signed)
Anesthesia Evaluation  Patient identified by MRN, date of birth, ID band Patient awake    Reviewed: Allergy & Precautions, H&P , NPO status , Patient's Chart, lab work & pertinent test results, reviewed documented beta blocker date and time   Airway Mallampati: I  TM Distance: >3 FB Neck ROM: full    Dental no notable dental hx. (+) Teeth Intact, Dental Advisory Given, Edentulous Upper, Poor Dentition,    Pulmonary neg pulmonary ROS,    Pulmonary exam normal breath sounds clear to auscultation       Cardiovascular Exercise Tolerance: Good hypertension, Pt. on medications and Pt. on home beta blockers + angina with exertion + CAD, + Past MI and + Cardiac Stents  + Valvular Problems/Murmurs  Rhythm:regular Rate:Normal  Cath 20'  1. Severe single vessel CAD with total occlusion of the proximal LAD and continued patency of the LIMA-LAD 2. Wide patency of the RCA and stented segments of the mid-RCA 3. Patency of the stented segments in the LCx and first OM with mild-moderate ISR 4. Normal LV function No high-grade stenosis identified. Favor ongoing medical therapy.  EKG: 12/20/2018 Rate 66 bpm Sinus rhythm  Borderline left axis deviation  RSR' in V1 or V2, right VCD or RVH  CV: Echo 12/16/2018 IMPRESSIONS    1. The left ventricle has normal systolic function, with an ejection  fraction of 55-60%. The cavity size was mildly dilated. Left ventricular  diastolic Doppler parameters are consistent with impaired relaxation.  2. The right ventricle has normal systolic function. The cavity was  normal. There is no increase in right ventricular wall thickness.    Neuro/Psych negative neurological ROS  negative psych ROS   GI/Hepatic negative GI ROS, Neg liver ROS,   Endo/Other  negative endocrine ROSdiabetes, Well Controlled, Type 2Hyperlipidemia  Renal/GU Renal diseaseRight ureteral calculus with indwelling stent  negative  genitourinary   Musculoskeletal  (+) Arthritis , Osteoarthritis,    Abdominal (+) + obese,   Peds  Hematology Xarelto therapy- last dose 09/27/19   Anesthesia Other Findings   Reproductive/Obstetrics negative OB ROS                             Anesthesia Physical  Anesthesia Plan  ASA: III  Anesthesia Plan: General   Post-op Pain Management:    Induction: Intravenous  PONV Risk Score and Plan: 2 and Ondansetron, Dexamethasone and Treatment may vary due to age or medical condition  Airway Management Planned: LMA  Additional Equipment:   Intra-op Plan:   Post-operative Plan: Extubation in OR  Informed Consent: I have reviewed the patients History and Physical, chart, labs and discussed the procedure including the risks, benefits and alternatives for the proposed anesthesia with the patient or authorized representative who has indicated his/her understanding and acceptance.     Dental Advisory Given and Dental advisory given  Plan Discussed with: CRNA, Surgeon and Anesthesiologist  Anesthesia Plan Comments: ( )        Anesthesia Quick Evaluation

## 2019-10-01 NOTE — Anesthesia Procedure Notes (Signed)
Procedure Name: LMA Insertion Date/Time: 10/01/2019 11:56 AM Performed by: Ante Arredondo D, CRNA Pre-anesthesia Checklist: Patient identified, Emergency Drugs available, Suction available and Patient being monitored Patient Re-evaluated:Patient Re-evaluated prior to induction Oxygen Delivery Method: Circle system utilized Preoxygenation: Pre-oxygenation with 100% oxygen Induction Type: IV induction Ventilation: Mask ventilation without difficulty LMA: LMA inserted LMA Size: 5.0 Tube type: Oral Number of attempts: 1 Placement Confirmation: positive ETCO2 and breath sounds checked- equal and bilateral Tube secured with: Tape Dental Injury: Teeth and Oropharynx as per pre-operative assessment

## 2019-10-01 NOTE — Transfer of Care (Signed)
Immediate Anesthesia Transfer of Care Note  Patient: KASMER SEEPERSAD  Procedure(s) Performed: CYSTOSCOPY/URETEROSCOPY/HOLMIUM LASER/STENT PLACEMENT (Right Ureter)  Patient Location: PACU  Anesthesia Type:General  Level of Consciousness: awake, alert  and oriented  Airway & Oxygen Therapy: Patient Spontanous Breathing and Patient connected to nasal cannula oxygen  Post-op Assessment: Report given to RN and Post -op Vital signs reviewed and stable  Post vital signs: Reviewed and stable  Last Vitals:  Vitals Value Taken Time  BP    Temp    Pulse 65 10/01/19 1259  Resp 13 10/01/19 1259  SpO2 96 % 10/01/19 1259  Vitals shown include unvalidated device data.  Last Pain:  Vitals:   10/01/19 1120  TempSrc: Oral  PainSc: 2       Patients Stated Pain Goal: 5 ([id number])  Complications: No apparent anesthesia complications

## 2019-10-01 NOTE — Anesthesia Postprocedure Evaluation (Signed)
Anesthesia Post Note  Patient: Ryan Vaughn  Procedure(s) Performed: CYSTOSCOPY/URETEROSCOPY/HOLMIUM LASER/STENT PLACEMENT (Right Ureter)     Patient location during evaluation: PACU Anesthesia Type: General Level of consciousness: awake and alert Pain management: pain level controlled Vital Signs Assessment: post-procedure vital signs reviewed and stable Respiratory status: spontaneous breathing, nonlabored ventilation and respiratory function stable Cardiovascular status: blood pressure returned to baseline and stable Postop Assessment: no apparent nausea or vomiting Anesthetic complications: no    Last Vitals:  Vitals:   10/01/19 1315 10/01/19 1330  BP: 124/72   Pulse: 70 75  Resp: 17 14  Temp:    SpO2: 95% 95%    Last Pain:  Vitals:   10/01/19 1259  TempSrc:   PainSc: 0-No pain                 Derian Pfost A.

## 2019-10-03 NOTE — Addendum Note (Signed)
Addendum  created 10/03/19 0819 by Bonney Aid, CRNA   Charge Capture section accepted

## 2019-10-08 DIAGNOSIS — N201 Calculus of ureter: Secondary | ICD-10-CM | POA: Diagnosis not present

## 2019-10-08 DIAGNOSIS — N132 Hydronephrosis with renal and ureteral calculous obstruction: Secondary | ICD-10-CM | POA: Diagnosis not present

## 2019-10-09 DIAGNOSIS — Z20828 Contact with and (suspected) exposure to other viral communicable diseases: Secondary | ICD-10-CM | POA: Diagnosis not present

## 2019-10-09 DIAGNOSIS — N201 Calculus of ureter: Secondary | ICD-10-CM | POA: Diagnosis not present

## 2019-10-09 DIAGNOSIS — R05 Cough: Secondary | ICD-10-CM | POA: Diagnosis not present

## 2019-10-09 DIAGNOSIS — J019 Acute sinusitis, unspecified: Secondary | ICD-10-CM | POA: Diagnosis not present

## 2019-10-14 DIAGNOSIS — N201 Calculus of ureter: Secondary | ICD-10-CM | POA: Diagnosis not present

## 2019-12-09 DIAGNOSIS — D123 Benign neoplasm of transverse colon: Secondary | ICD-10-CM | POA: Diagnosis not present

## 2019-12-12 ENCOUNTER — Other Ambulatory Visit: Payer: Self-pay

## 2019-12-12 MED ORDER — ROSUVASTATIN CALCIUM 40 MG PO TABS: ORAL_TABLET | ORAL | 1 refills | Status: DC

## 2019-12-12 NOTE — Telephone Encounter (Signed)
This is a Nurse, mental health pt, Dr. Bronson Ing.

## 2019-12-18 ENCOUNTER — Other Ambulatory Visit: Payer: Self-pay

## 2019-12-18 ENCOUNTER — Emergency Department (HOSPITAL_COMMUNITY): Payer: Medicare Other

## 2019-12-18 ENCOUNTER — Encounter (HOSPITAL_COMMUNITY): Payer: Self-pay | Admitting: Emergency Medicine

## 2019-12-18 ENCOUNTER — Inpatient Hospital Stay (HOSPITAL_COMMUNITY)
Admission: EM | Admit: 2019-12-18 | Discharge: 2019-12-20 | DRG: 281 | Disposition: A | Discharge status: Home / Self Care | Payer: Medicare Other | Attending: Cardiovascular Disease | Admitting: Cardiovascular Disease

## 2019-12-18 DIAGNOSIS — Z20822 Contact with and (suspected) exposure to covid-19: Secondary | ICD-10-CM | POA: Diagnosis present

## 2019-12-18 DIAGNOSIS — I10 Essential (primary) hypertension: Secondary | ICD-10-CM | POA: Diagnosis not present

## 2019-12-18 DIAGNOSIS — E785 Hyperlipidemia, unspecified: Secondary | ICD-10-CM | POA: Diagnosis present

## 2019-12-18 DIAGNOSIS — M1990 Unspecified osteoarthritis, unspecified site: Secondary | ICD-10-CM | POA: Diagnosis present

## 2019-12-18 DIAGNOSIS — Z888 Allergy status to other drugs, medicaments and biological substances status: Secondary | ICD-10-CM

## 2019-12-18 DIAGNOSIS — E782 Mixed hyperlipidemia: Secondary | ICD-10-CM | POA: Diagnosis present

## 2019-12-18 DIAGNOSIS — I214 Non-ST elevation (NSTEMI) myocardial infarction: Secondary | ICD-10-CM | POA: Diagnosis not present

## 2019-12-18 DIAGNOSIS — R9431 Abnormal electrocardiogram [ECG] [EKG]: Secondary | ICD-10-CM | POA: Diagnosis not present

## 2019-12-18 DIAGNOSIS — Z951 Presence of aortocoronary bypass graft: Secondary | ICD-10-CM

## 2019-12-18 DIAGNOSIS — C8590 Non-Hodgkin lymphoma, unspecified, unspecified site: Secondary | ICD-10-CM | POA: Diagnosis present

## 2019-12-18 DIAGNOSIS — Z955 Presence of coronary angioplasty implant and graft: Secondary | ICD-10-CM

## 2019-12-18 DIAGNOSIS — E119 Type 2 diabetes mellitus without complications: Secondary | ICD-10-CM | POA: Diagnosis not present

## 2019-12-18 DIAGNOSIS — R519 Headache, unspecified: Secondary | ICD-10-CM | POA: Diagnosis not present

## 2019-12-18 DIAGNOSIS — I2582 Chronic total occlusion of coronary artery: Secondary | ICD-10-CM | POA: Diagnosis not present

## 2019-12-18 DIAGNOSIS — Z79899 Other long term (current) drug therapy: Secondary | ICD-10-CM

## 2019-12-18 DIAGNOSIS — I252 Old myocardial infarction: Secondary | ICD-10-CM

## 2019-12-18 DIAGNOSIS — R0789 Other chest pain: Secondary | ICD-10-CM | POA: Diagnosis not present

## 2019-12-18 DIAGNOSIS — I2510 Atherosclerotic heart disease of native coronary artery without angina pectoris: Secondary | ICD-10-CM | POA: Diagnosis present

## 2019-12-18 DIAGNOSIS — Z7984 Long term (current) use of oral hypoglycemic drugs: Secondary | ICD-10-CM

## 2019-12-18 DIAGNOSIS — Z91041 Radiographic dye allergy status: Secondary | ICD-10-CM

## 2019-12-18 DIAGNOSIS — Z87442 Personal history of urinary calculi: Secondary | ICD-10-CM

## 2019-12-18 DIAGNOSIS — R079 Chest pain, unspecified: Secondary | ICD-10-CM | POA: Diagnosis not present

## 2019-12-18 LAB — BASIC METABOLIC PANEL
Anion gap: 9 (ref 5–15)
BUN: 8 mg/dL (ref 8–23)
CO2: 27 mmol/L (ref 22–32)
Calcium: 9.3 mg/dL (ref 8.9–10.3)
Chloride: 102 mmol/L (ref 98–111)
Creatinine, Ser: 1.07 mg/dL (ref 0.61–1.24)
GFR calc Af Amer: >60 mL/min (ref >60)
GFR calc non Af Amer: >60 mL/min (ref >60)
Glucose, Bld: 168 mg/dL — ABNORMAL HIGH (ref 70–99)
Potassium: 3.8 mmol/L (ref 3.5–5.1)
Sodium: 138 mmol/L (ref 135–145)

## 2019-12-18 LAB — HEMOGLOBIN A1C
Hgb A1c MFr Bld: 6.8 % — ABNORMAL HIGH (ref 4.8–5.6)
Mean Plasma Glucose: 148.46 mg/dL

## 2019-12-18 LAB — CBC
HCT: 44.5 % (ref 39.0–52.0)
Hemoglobin: 14.6 g/dL (ref 13.0–17.0)
MCH: 29.8 pg (ref 26.0–34.0)
MCHC: 32.8 g/dL (ref 30.0–36.0)
MCV: 90.8 fL (ref 80.0–100.0)
Platelets: 208 10*3/uL (ref 150–400)
RBC: 4.9 MIL/uL (ref 4.22–5.81)
RDW: 13.1 % (ref 11.5–15.5)
WBC: 8.1 10*3/uL (ref 4.0–10.5)
nRBC: 0 % (ref 0.0–0.2)

## 2019-12-18 LAB — PROTIME-INR
INR: 1.1 (ref 0.8–1.2)
Prothrombin Time: 14 seconds (ref 11.4–15.2)

## 2019-12-18 LAB — CBG MONITORING, ED: Glucose-Capillary: 173 mg/dL — ABNORMAL HIGH (ref 70–99)

## 2019-12-18 LAB — TROPONIN I (HIGH SENSITIVITY)
Troponin I (High Sensitivity): 744 ng/L (ref <18)
Troponin I (High Sensitivity): 1151 ng/L (ref <18)

## 2019-12-18 LAB — TSH: TSH: 1.589 u[IU]/mL (ref 0.350–4.500)

## 2019-12-18 MED ORDER — NITROGLYCERIN 0.4 MG SL SUBL
0.4000 mg | SUBLINGUAL_TABLET | SUBLINGUAL | Status: DC | PRN
  Administered 2019-12-18 (×2): 0.4 mg via SUBLINGUAL

## 2019-12-18 MED ORDER — ASPIRIN 300 MG RE SUPP: 300.0000 mg | RECTAL | Status: DC

## 2019-12-18 MED ORDER — ATORVASTATIN CALCIUM 80 MG PO TABS
80.0000 mg | ORAL_TABLET | Freq: Every day | ORAL | Status: DC
  Administered 2019-12-19: 80 mg via ORAL
  Filled 2019-12-18 (×2): qty 1

## 2019-12-18 MED ORDER — INSULIN ASPART 100 UNIT/ML ~~LOC~~ SOLN
0.0000 [IU] | Freq: Every day | SUBCUTANEOUS | Status: DC
  Administered 2019-12-19: 2 [IU] via SUBCUTANEOUS

## 2019-12-18 MED ORDER — SODIUM CHLORIDE 0.9% FLUSH: 3.0000 mL | Freq: Once | INTRAVENOUS | Status: DC

## 2019-12-18 MED ORDER — INSULIN ASPART 100 UNIT/ML ~~LOC~~ SOLN
0.0000 [IU] | Freq: Three times a day (TID) | SUBCUTANEOUS | Status: DC
  Administered 2019-12-19 – 2019-12-20 (×2): 2 [IU] via SUBCUTANEOUS
  Administered 2019-12-20: 8 [IU] via SUBCUTANEOUS

## 2019-12-18 MED ORDER — ASPIRIN 81 MG PO CHEW
324.0000 mg | CHEWABLE_TABLET | Freq: Once | ORAL | Status: AC
  Administered 2019-12-18: 324 mg via ORAL
  Filled 2019-12-18: qty 4

## 2019-12-18 MED ORDER — NITROGLYCERIN IN D5W 200-5 MCG/ML-% IV SOLN
2.0000 ug/min | INTRAVENOUS | Status: DC
  Administered 2019-12-18: 200 ug/min via INTRAVENOUS
  Filled 2019-12-18: qty 250

## 2019-12-18 MED ORDER — ASPIRIN EC 81 MG PO TBEC
81.0000 mg | DELAYED_RELEASE_TABLET | Freq: Every day | ORAL | Status: DC
  Administered 2019-12-19 – 2019-12-20 (×2): 81 mg via ORAL
  Filled 2019-12-18 (×2): qty 1

## 2019-12-18 MED ORDER — ACETAMINOPHEN 325 MG PO TABS
650.0000 mg | ORAL_TABLET | ORAL | Status: DC | PRN
  Administered 2019-12-19 – 2019-12-20 (×3): 650 mg via ORAL
  Filled 2019-12-18 (×4): qty 2

## 2019-12-18 MED ORDER — ZOLPIDEM TARTRATE 5 MG PO TABS: 5.0000 mg | ORAL_TABLET | Freq: Every evening | ORAL | Status: DC | PRN

## 2019-12-18 MED ORDER — MORPHINE SULFATE (PF) 2 MG/ML IV SOLN
2.0000 mg | INTRAVENOUS | Status: DC | PRN
  Administered 2019-12-19 (×4): 2 mg via INTRAVENOUS
  Filled 2019-12-18 (×4): qty 1

## 2019-12-18 MED ORDER — ASPIRIN 81 MG PO CHEW: 324.0000 mg | CHEWABLE_TABLET | ORAL | Status: DC

## 2019-12-18 MED ORDER — ALPRAZOLAM 0.25 MG PO TABS: 0.2500 mg | ORAL_TABLET | Freq: Two times a day (BID) | ORAL | Status: DC | PRN

## 2019-12-18 MED ORDER — HEPARIN (PORCINE) 25000 UT/250ML-% IV SOLN
1300.0000 [IU]/h | INTRAVENOUS | Status: DC
  Administered 2019-12-18: 1150 [IU]/h via INTRAVENOUS
  Filled 2019-12-18: qty 250

## 2019-12-18 MED ORDER — ONDANSETRON HCL 4 MG/2ML IJ SOLN: 4.0000 mg | Freq: Four times a day (QID) | INTRAMUSCULAR | Status: DC | PRN

## 2019-12-18 MED ORDER — HEPARIN BOLUS VIA INFUSION
4000.0000 [IU] | Freq: Once | INTRAVENOUS | Status: AC
  Administered 2019-12-18: 4000 [IU] via INTRAVENOUS
  Filled 2019-12-18: qty 4000

## 2019-12-18 MED ORDER — NITROGLYCERIN 0.4 MG SL SUBL
0.4000 mg | SUBLINGUAL_TABLET | SUBLINGUAL | Status: DC | PRN
  Administered 2019-12-18: 0.4 mg via SUBLINGUAL
  Filled 2019-12-18: qty 1

## 2019-12-18 NOTE — H&P (Signed)
Cardiology Admission History and Physical:   Patient ID: Ryan Vaughn MRN: [id number]; DOB: 1951-04-23   Admission date: 12/18/2019  Primary Care Provider: Curlene Labrum, MD [facility] Cardiologist: Kate Sable, MD (Inactive)  [facility] Electrophysiologist:  None   Chief Complaint:  CP/NSTEMI  Patient Profile:   Ryan Vaughn is a 69 y.o. male with h/o CAD s/p single-vessel CABG (LIMA-LAD), multiple PCI in the RCA and LCx territories, last LHC in July 2020 showing patent LIMA and prior stents w/ medical mgmt recommended, now presents w/ CP and elevated HS trop.  History of Present Illness:   Mr. Ballentine has the above cardiac hx and presented to the [facility] today c/o CP that started around noon. He describes it as a "pressure" in the middle and L side of his chest that does not radiate. No associated SOB, nausea or diaphoresis. It lasted several minutes and he rates it as a 7/10, but did improve w/ NTG. It did not resolve completely after the NTG. He tried [employer] as well w/o full resolution. Sx are similar to prior angina. Initial HS trop is 744. EKG shows no acute ischemic changes. Pt is currently having low-level pain but has currently only received SL NTG x 1 dose so far in the ED.   Past Medical History:  Diagnosis Date  . Arthritis   . CAD (coronary artery disease)    a. DES to OM 1 and DES mLCX (12/05/13) b. CABG x1  ('92) c. 6 stents placed in 2009? (pt hx)   . Diabetes mellitus without complication ([place])    type 2  . Essential hypertension, benign   . Heart murmur    as a child   . History of kidney stones   . Lumbago   . Mixed hyperlipidemia   . Myocardial infarction [facility]) age 63  . Non Hodgkin's lymphoma ([place]) 2002   a. stage IV, chemo done  . Personal history of colonic polyps     Past Surgical History:  Procedure Laterality Date  . CARDIAC CATHETERIZATION N/A 07/16/2015   Procedure: Left Heart Cath and Cors/Grafts Angiography;  Surgeon: Jettie Booze, MD;  Location: [place] [facility];  Service: Cardiovascular;  Laterality: N/A;  . CARDIAC CATHETERIZATION N/A 07/16/2015   Procedure: Coronary Stent Intervention;  Surgeon: Jettie Booze, MD;  Location: [place] [facility];  Service: Cardiovascular;  Laterality: N/A;  . CHOLECYSTECTOMY  08/2000  . CORONARY ANGIOPLASTY WITH STENT PLACEMENT  ~ 1999; ~ 2009 X 2; 12/05/2013   "1 +3 + 2 + 2" ( total of 8 or 9 stents)  . CORONARY ARTERY BYPASS GRAFT  1992   "CABG X1"  . CYSTOSCOPY W/ URETERAL STENT PLACEMENT Right 09/12/2019   Procedure: CYSTOSCOPY WITH RETROGRADE PYELOGRAM/URETERAL STENT PLACEMENT;  Surgeon: Ceasar Mons, MD;  Location: [facility];  Service: Urology;  Laterality: Right;  . CYSTOSCOPY/URETEROSCOPY/HOLMIUM LASER/STENT PLACEMENT Right 10/01/2019   Procedure: CYSTOSCOPY/URETEROSCOPY/HOLMIUM LASER/STENT PLACEMENT;  Surgeon: Ceasar Mons, MD;  Location[facility];  Service: Urology;  Laterality: Right;  . EXPLORATORY LAPAROTOMY WITH ABDOMINAL MASS EXCISION  08/2000   "looking for cancer"  . INGUINAL HERNIA REPAIR    . LEFT HEART CATH AND CORS/GRAFTS ANGIOGRAPHY N/A 12/16/2018   Procedure: LEFT HEART CATH AND CORS/GRAFTS ANGIOGRAPHY;  Surgeon: Sherren Mocha, MD;  Location: [place] [facility];  Service: Cardiovascular;  Laterality: N/A;  . LEFT HEART CATHETERIZATION WITH CORONARY ANGIOGRAM N/A 12/05/2013   Procedure: LEFT HEART CATHETERIZATION WITH CORONARY  Cyril Loosen;  Surgeon: Blane Ohara, MD;  Location: [facility] CATH LAB;  Service: Cardiovascular;  Laterality: N/A;  . PERCUTANEOUS CORONARY STENT INTERVENTION (PCI-S)  12/05/2013   Procedure: PERCUTANEOUS CORONARY STENT INTERVENTION (PCI-S);  Surgeon: Blane Ohara, MD;  Location: [facility] CATH LAB;  Service: Cardiovascular;;  mid circumflex and prox OM2  . TONSILLECTOMY       Medications Prior to Admission: Prior to Admission medications   Medication Sig Start Date End Date Taking? Authorizing  Provider  cephALEXin (KEFLEX) 500 MG capsule Take 500 mg by mouth 2 (two) times daily. 7 day supply - started Wednesday 09/10/19    [provider]  HYDROcodone-acetaminophen (NORCO) 5-325 MG tablet Take 1 tablet by mouth every 4 (four) hours as needed for moderate pain. 10/01/19   Ceasar Mons, MD  metFORMIN (GLUCOPHAGE) 500 MG tablet Take 500 mg by mouth 2 (two) times daily with a meal.    [provider]  metoprolol succinate (TOPROL-XL) 50 MG 24 hr tablet Take 50 mg by mouth daily.    [provider]  nitroGLYCERIN (NITROSTAT) 0.4 MG SL tablet Place 1 tablet (0.4 mg total) under the tongue every 5 (five) minutes x 3 doses as needed for chest pain. 09/16/19   Herminio Commons, MD  ondansetron (ZOFRAN) 4 MG tablet Take 4 mg by mouth every 6 (six) hours as needed for nausea or vomiting.    [provider]  phenazopyridine (PYRIDIUM) 200 MG tablet Take 1 tablet (200 mg total) by mouth 3 (three) times daily as needed (for pain with urination). 10/01/19 09/30/20  Ceasar Mons, MD  rosuvastatin (CRESTOR) 40 MG tablet TAKE 1 TABLET(40 MG) BY MOUTH DAILY 12/12/19   Imogene Burn, PA-C  tamsulosin (FLOMAX) 0.4 MG CAPS capsule Take 0.4 mg by mouth.    [provider]  tamsulosin (FLOMAX) 0.4 MG CAPS capsule Take 1 capsule (0.4 mg total) by mouth daily. 10/01/19   Ceasar Mons, MD     Allergies:    Allergies  Allergen Reactions  . Imdur [Isosorbide Nitrate] Diarrhea and Other (See Comments)    Headache, diarrhea  . Ivp Dye [Iodinated Diagnostic Agents] Other (See Comments)    headache    Social History:   Social History   Socioeconomic History  . Marital status: Divorced    Spouse name: Not on file  . Number of children: 3  . Years of education: 40  . Highest education level: Not on file  Occupational History  . Not on file  Tobacco Use  . Smoking status: Never Smoker  . Smokeless tobacco: Current User     Types: Snuff  . Tobacco comment: 12/05/2013 "use snuff q now and again"  Vaping Use  . Vaping Use: Never used  Substance and Sexual Activity  . Alcohol use: Not Currently    Alcohol/week: 0.0 standard drinks  . Drug use: No  . Sexual activity: Yes  Other Topics Concern  . Not on file  Social History Narrative  . Not on file   Social Determinants of Health   Financial Resource Strain:   . Difficulty of Paying Living Expenses:   Food Insecurity:   . Worried About Charity fundraiser in the Last Year:   . Arboriculturist in the Last Year:   Transportation Needs:   . Film/video editor (Medical):   Marland Kitchen Lack of Transportation (Non-Medical):   Physical Activity:   . Days of Exercise per Week:   .  Minutes of Exercise per Session:   Stress:   . Feeling of Stress :   Social Connections:   . Frequency of Communication with Friends and Family:   . Frequency of Social Gatherings with Friends and Family:   . Attends Religious Services:   . Active Member of Clubs or Organizations:   . Attends Archivist Meetings:   Marland Kitchen Marital Status:   Intimate Partner Violence:   . Fear of Current or Ex-Partner:   . Emotionally Abused:   Marland Kitchen Physically Abused:   . Sexually Abused:     Family History:   The patient's family history includes Breast cancer in his mother; Colon cancer in his father.    ROS:  Please see the history of present illness.  All other ROS reviewed and negative.     Physical Exam/Data:   Vitals:   12/18/19 1943 12/18/19 1948  BP: (!) 122/97   Pulse: 73   Resp: 20   Temp: 98.7 F (37.1 C)   TempSrc: Oral   SpO2: 99%   Weight:  (!) 110 kg  Height:  5\' 11"  (1.803 m)   No intake or output data in the 24 hours ending 12/18/19 2217 Last 3 Weights 12/18/2019 10/01/2019 09/26/2019  Weight (lbs) 242 lb 8.1 oz 227 lb 12.8 oz 215 lb  Weight (kg) 110 kg 103.329 kg 97.523 kg     Body mass index is 33.82 kg/m.  General:  Well nourished, well developed, in no acute  distress HEENT: normal Lymph: no adenopathy Neck: no JVD Endocrine:  No thryomegaly Vascular: No carotid bruits; DP pulses 2+ bilaterally  Cardiac:  normal S1, S2; RRR; no murmur  Lungs:  clear to auscultation bilaterally, no wheezing, rhonchi or rales  Abd: soft, nontender, no hepatomegaly  Ext: no edema Musculoskeletal:  No deformities Skin: warm and dry  Neuro:  no focal abnormalities noted Psych:  Normal affect    EKG:  The ECG that was done 12-18-19 was personally reviewed and demonstrates NSR without acute ischemic changes  Relevant CV Studies: LHC 12-16-18 1. Severe single vessel CAD with total occlusion of the proximal LAD and continued patency of the LIMA-LAD 2. Wide patency of the RCA and stented segments of the mid-RCA 3. Patency of the stented segments in the LCx and first OM with mild-moderate ISR 4. Normal LV function  No high-grade stenosis identified. Favor ongoing medical therapy.  12-16-18 TTE 1. The left ventricle has normal systolic function, with an ejection  fraction of 55-60%. The cavity size was mildly dilated. Left ventricular  diastolic Doppler parameters are consistent with impaired relaxation.  2. The right ventricle has normal systolic function. The cavity was  normal. There is no increase in right ventricular wall thickness.    Laboratory Data:  High Sensitivity Troponin:   Recent Labs  Lab 12/18/19 1958  TROPONINIHS 744*      Chemistry Recent Labs  Lab 12/18/19 1958  NA 138  K 3.8  CL 102  CO2 27  GLUCOSE 168*  BUN 8  CREATININE 1.07  CALCIUM 9.3  GFRNONAA >60  GFRAA >60  ANIONGAP 9    No results for input(s): PROT, ALBUMIN, AST, ALT, ALKPHOS, BILITOT in the last 168 hours. Hematology Recent Labs  Lab 12/18/19 1958  WBC 8.1  RBC 4.90  HGB 14.6  HCT 44.5  MCV 90.8  MCH 29.8  MCHC 32.8  RDW 13.1  PLT 208   BNPNo results for input(s): BNP, PROBNP in the last 168  hours.  DDimer No results for input(s): DDIMER in the  last 168 hours.   Radiology/Studies:  DG Chest 2 View  Result Date: 12/18/2019 CLINICAL DATA:  Chest pain. EXAM: CHEST - 2 VIEW COMPARISON:  December 15, 2018. FINDINGS: The heart size and mediastinal contours are within normal limits. Both lungs are clear. No pneumothorax or pleural effusion is noted. Sternotomy wires are noted. The visualized skeletal structures are unremarkable. IMPRESSION: No active cardiopulmonary disease. Electronically Signed   By: Marijo Conception M.D.   On: 12/18/2019 20:10       TIMI Risk Score for Unstable Angina or Non-ST Elevation MI:   The patient's TIMI risk score is 5, which indicates a 26% risk of all cause mortality, new or recurrent myocardial infarction or need for urgent revascularization in the next 14 days.      Assessment and Plan:   1. CP/NSTEMI: in setting of h/o single-vessel CABG (LIMA-LAD) that was patent as of July 2020, and multiple prior PCI to RCA and LCx territories. Will cont to cycle HS trop, treat w/ IV heparin and plan for LHC in the AM. Will start NTG gtt to get pt pain-free. Will check A1c, FLP and TSH for risk stratification. Will get TTE to reevaluate LVEF. 2. DM/HTN/dyslipidemia: start pre-hospital regimen and titrate as needed. Currently on crestor 40; LDL goal<70.   For questions or updates, please contact [place] Please consult [email or web address] for contact info under     Signed, Rudean Curt, MD, [facility]  12/18/2019 10:17 PM

## 2019-12-18 NOTE — ED Provider Notes (Signed)
[facility] EMERGENCY DEPARTMENT Provider Note   CSN: [id number] Arrival date & time: 12/18/19  1939     History Chief Complaint  Patient presents with  . Chest Pain    Troponin=744    Ryan Vaughn is a 69 y.o. male.  HPI 69 year old male presents with chest pain.  Started around noon.  Patient states it feels like a pressure in the middle/left side of his chest.  It does not radiate.  He had very brief amount of back pain but that is gone.  No shortness of breath.  Pain is about a 7/10.  He took 1 nitroglycerin with some partial relief.  He also tried Tums that did not help.  His left arm has been tingly.  He has a longstanding CAD history with multiple stents and CABG.  No leg swelling. Cardiologist is Dr. Burt Knack.   Past Medical History:  Diagnosis Date  . Arthritis   . CAD (coronary artery disease)    a. DES to OM 1 and DES mLCX (12/05/13) b. CABG x1  ('92) c. 6 stents placed in 2009? (pt hx)   . Diabetes mellitus without complication ([place])    type 2  . Essential hypertension, benign   . Heart murmur    as a child   . History of kidney stones   . Lumbago   . Mixed hyperlipidemia   . Myocardial infarction [facility]) age 25  . Non Hodgkin's lymphoma ([place]) 2002   a. stage IV, chemo done  . Personal history of colonic polyps     Patient Active Problem List   Diagnosis Date Noted  . NSTEMI (non-ST elevated myocardial infarction) ([place]) 12/18/2019  . Obesity (BMI 30-39.9) 01/13/2019  . Chest pain 12/15/2018  . Coronary artery disease involving native coronary artery of native heart with unstable angina pectoris ([place])   . Unstable angina ([place]) 12/06/2013  . CAD (coronary artery disease)   . Non Hodgkin's lymphoma ([place])   . Arthritis   . Kidney stones   . S/P CABG (coronary artery bypass graft) 02/06/2013  . HTN (hypertension) 02/06/2013  . Hyperlipidemia 02/06/2013  . Colon polyps 02/06/2013    Past Surgical History:  Procedure Laterality Date  .  CARDIAC CATHETERIZATION N/A 07/16/2015   Procedure: Left Heart Cath and Cors/Grafts Angiography;  Surgeon: Jettie Booze, MD;  Location: [facility];  Service: Cardiovascular;  Laterality: N/A;  . CARDIAC CATHETERIZATION N/A 07/16/2015   Procedure: Coronary Stent Intervention;  Surgeon: Jettie Booze, MD;  Location: [facility];  Service: Cardiovascular;  Laterality: N/A;  . CHOLECYSTECTOMY  08/2000  . CORONARY ANGIOPLASTY WITH STENT PLACEMENT  ~ 1999; ~ 2009 X 2; 12/05/2013   "1 +3 + 2 + 2" ( total of 8 or 9 stents)  . CORONARY ARTERY BYPASS GRAFT  1992   "CABG X1"  . CYSTOSCOPY W/ URETERAL STENT PLACEMENT Right 09/12/2019   Procedure: CYSTOSCOPY WITH RETROGRADE PYELOGRAM/URETERAL STENT PLACEMENT;  Surgeon: Ceasar Mons, MD;  Location: [facility];  Service: Urology;  Laterality: Right;  . CYSTOSCOPY/URETEROSCOPY/HOLMIUM LASER/STENT PLACEMENT Right 10/01/2019   Procedure: CYSTOSCOPY/URETEROSCOPY/HOLMIUM LASER/STENT PLACEMENT;  Surgeon: Ceasar Mons, MD;  Location: [facility];  Service: Urology;  Laterality: Right;  . EXPLORATORY LAPAROTOMY WITH ABDOMINAL MASS EXCISION  08/2000   "looking for cancer"  . INGUINAL HERNIA REPAIR    . LEFT HEART CATH AND CORS/GRAFTS ANGIOGRAPHY N/A 12/16/2018   Procedure: LEFT HEART CATH AND CORS/GRAFTS ANGIOGRAPHY;  Surgeon: Burt Knack,  Legrand Como, MD;  Location: [facility];  Service: Cardiovascular;  Laterality: N/A;  . LEFT HEART CATHETERIZATION WITH CORONARY ANGIOGRAM N/A 12/05/2013   Procedure: LEFT HEART CATHETERIZATION WITH CORONARY ANGIOGRAM;  Surgeon: Blane Ohara, MD;  Location: [facility] C[facility] LAB;  Service: Cardiovascular;  Laterality: N/A;  . PERCUTANEOUS CORONARY STENT INTERVENTION (PCI-S)  12/05/2013   Procedure: PERCUTANEOUS CORONARY STENT INTERVENTION (PCI-S);  Surgeon: Blane Ohara, MD;  Location: [facility] C[facility] LAB;  Service: Cardiovascular;;  mid circumflex and prox OM2  . TONSILLECTOMY          Family History  Problem Relation Age of Onset  . Breast cancer Mother   . Colon cancer Father     Social History   Tobacco Use  . Smoking status: Never Smoker  . Smokeless tobacco: Current User    Types: Snuff  . Tobacco comment: 12/05/2013 "use snuff q now and again"  Vaping Use  . Vaping Use: Never used  Substance Use Topics  . Alcohol use: Not Currently    Alcohol/week: 0.0 standard drinks  . Drug use: No    Home Medications Prior to Admission medications   Medication Sig Start Date End Date Taking? Authorizing Provider  cephALEXin (KEFLEX) 500 MG capsule Take 500 mg by mouth 2 (two) times daily. 7 day supply - started Wednesday 09/10/19    [provider]  HYDROcodone-acetaminophen (NORCO) 5-325 MG tablet Take 1 tablet by mouth every 4 (four) hours as needed for moderate pain. 10/01/19   Ceasar Mons, MD  metFORMIN (GLUCOPHAGE) 500 MG tablet Take 500 mg by mouth 2 (two) times daily with a meal.    [provider]  metoprolol succinate (TOPROL-XL) 50 MG 24 hr tablet Take 50 mg by mouth daily.    [provider]  nitroGLYCERIN (NITROSTAT) 0.4 MG SL tablet Place 1 tablet (0.4 mg total) under the tongue every 5 (five) minutes x 3 doses as needed for chest pain. 09/16/19   Herminio Commons, MD  ondansetron (ZOFRAN) 4 MG tablet Take 4 mg by mouth every 6 (six) hours as needed for nausea or vomiting.    [provider]  phenazopyridine (PYRIDIUM) 200 MG tablet Take 1 tablet (200 mg total) by mouth 3 (three) times daily as needed (for pain with urination). 10/01/19 09/30/20  Ceasar Mons, MD  rosuvastatin (CRESTOR) 40 MG tablet TAKE 1 TABLET(40 MG) BY MOUTH DAILY 12/12/19   Imogene Burn, PA-C  tamsulosin (FLOMAX) 0.4 MG CAPS capsule Take 0.4 mg by mouth.    [provider]  tamsulosin (FLOMAX) 0.4 MG CAPS capsule Take 1 capsule (0.4 mg total) by mouth daily. 10/01/19   Ceasar Mons, MD     Allergies    Imdur [isosorbide nitrate] and Ivp dye [iodinated diagnostic agents]  Review of Systems   Review of Systems  Respiratory: Negative for shortness of breath.   Cardiovascular: Positive for chest pain. Negative for leg swelling.  Neurological: Positive for numbness.  All other systems reviewed and are negative.   Physical Exam Updated Vital Signs BP 127/83 (BP Location: Right Arm)   Pulse 73   Temp 98.7 F (37.1 C) (Oral)   Resp 19   Ht 5\' 11"  (1.803 m)   Wt (!) 110 kg   SpO2 98%   BMI 33.82 kg/m   Physical Exam Vitals and nursing note reviewed.  Constitutional:      General: He is not in acute distress.    Appearance: He is well-developed. He  is not ill-appearing or diaphoretic.  HENT:     Head: Normocephalic and atraumatic.     Right Ear: External ear normal.     Left Ear: External ear normal.     Nose: Nose normal.  Eyes:     General:        Right eye: No discharge.        Left eye: No discharge.  Cardiovascular:     Rate and Rhythm: Normal rate and regular rhythm.     Pulses:          Radial pulses are 2+ on the right side and 2+ on the left side.     Heart sounds: Normal heart sounds.  Pulmonary:     Effort: Pulmonary effort is normal.     Breath sounds: Normal breath sounds.  Chest:     Chest wall: No tenderness.  Abdominal:     Palpations: Abdomen is soft.     Tenderness: There is no abdominal tenderness.  Musculoskeletal:     Cervical back: Neck supple.     Right lower leg: No edema.     Left lower leg: No edema.  Skin:    General: Skin is warm and dry.  Neurological:     Mental Status: He is alert.  Psychiatric:        Mood and Affect: Mood is not anxious.     ED Results / Procedures / Treatments   Labs (all labs ordered are listed, but only abnormal results are displayed) Labs Reviewed  BASIC METABOLIC PANEL - Abnormal; Notable for the following components:      Result Value   Glucose, Bld 168 (*)    All other components  within normal limits  TROPONIN I (HIGH SENSITIVITY) - Abnormal; Notable for the following components:   Troponin I (High Sensitivity) 744 (*)    All other components within normal limits  SARS CORONAVIRUS 2 BY RT PCR (HOSPITAL ORDER, [facility])  CBC  PROTIME-INR  HIV ANTIBODY (ROUTINE TESTING W REFLEX)  TSH  HEMOGLOBIN F6O  BASIC METABOLIC PANEL  LIPID PANEL  CBC  PROTIME-INR  HEPARIN LEVEL (UNFRACTIONATED)  TROPONIN I (HIGH SENSITIVITY)  TROPONIN I (HIGH SENSITIVITY)    EKG EKG Interpretation  Date/Time:  Thursday December 18 2019 19:49:54 EDT Ventricular Rate:  73 PR Interval:  166 QRS Duration: 90 QT Interval:  408 QTC Calculation: 449 R Axis:   -33 Text Interpretation: Normal sinus rhythm Left axis deviation Abnormal ECG similar to July 2020 Confirmed by Sherwood Gambler [id number]) on 12/18/2019 9:44:50 PM   Radiology DG Chest 2 View  Result Date: 12/18/2019 CLINICAL DATA:  Chest pain. EXAM: CHEST - 2 VIEW COMPARISON:  December 15, 2018. FINDINGS: The heart size and mediastinal contours are within normal limits. Both lungs are clear. No pneumothorax or pleural effusion is noted. Sternotomy wires are noted. The visualized skeletal structures are unremarkable. IMPRESSION: No active cardiopulmonary disease. Electronically Signed   By: Marijo Conception M.D.   On: 12/18/2019 20:10    Procedures .Critical Care Performed by: Sherwood Gambler, MD Authorized by: Sherwood Gambler, MD   Critical care provider statement:    Critical care time (minutes):  30   Critical care time was exclusive of:  Separately billable procedures and treating other patients   Critical care was necessary to treat or prevent imminent or life-threatening deterioration of the following conditions:  Cardiac failure   Critical care was time spent personally by me on the  following activities:  Discussions with consultants, evaluation of patient's response to treatment, examination of patient,  ordering and performing treatments and interventions, ordering and review of laboratory studies, ordering and review of radiographic studies, pulse oximetry, re-evaluation of patient's condition, obtaining history from patient or surrogate and review of old charts   (including critical care time)  Medications Ordered in ED Medications  sodium chloride flush (NS) 0.9 % injection 3 mL (3 mLs Intravenous Not Given 12/18/19 2217)  nitroGLYCERIN (NITROSTAT) SL tablet 0.4 mg (0.4 mg Sublingual Given 12/18/19 2232)  aspirin EC tablet 81 mg (has no administration in time range)  nitroGLYCERIN (NITROSTAT) SL tablet 0.4 mg (0.4 mg Sublingual Given 12/18/19 2248)  acetaminophen (TYLENOL) tablet 650 mg (has no administration in time range)  ondansetron (ZOFRAN) injection 4 mg (has no administration in time range)  atorvastatin (LIPITOR) tablet 80 mg (has no administration in time range)  zolpidem (AMBIEN) tablet 5 mg (has no administration in time range)  ALPRAZolam (XANAX) tablet 0.25 mg (has no administration in time range)  insulin aspart (novoLOG) injection 0-15 Units (has no administration in time range)  insulin aspart (novoLOG) injection 0-5 Units (0 Units Subcutaneous Not Given 12/18/19 2313)  heparin ADULT infusion 100 units/mL (25000 units/235mL sodium chloride 0.45%) (1,150 Units/hr Intravenous New Bag/Given 12/18/19 2301)  nitroGLYCERIN 50 mg in dextrose 5 % 250 mL (0.2 mg/mL) infusion (200 mcg/min Intravenous New Bag/Given 12/18/19 2309)  morphine 2 MG/ML injection 2 mg (has no administration in time range)  aspirin chewable tablet 324 mg (324 mg Oral Given 12/18/19 2247)  heparin bolus via infusion 4,000 Units (4,000 Units Intravenous Bolus from Bag 12/18/19 2302)    ED Course  I have reviewed the triage vital signs and the nursing notes.  Pertinent labs & imaging results that were available during my care of the patient were reviewed by me and considered in my medical decision making (see chart  for details).    MDM Rules/Calculators/A&P                          Patient presents with chest pain since this afternoon. ECG without acute ischemia but does have significant troponin elevation c/w NSTEMI. Doubt dissection or PE. Given ASA, nitro and heparin. Cardiology consulted and will admit.  Final Clinical Impression(s) / ED Diagnoses Final diagnoses:  NSTEMI (non-ST elevated myocardial infarction) [facility])    Rx / DC Orders ED Discharge Orders    None       Sherwood Gambler, MD 12/18/19 2316

## 2019-12-18 NOTE — ED Triage Notes (Signed)
Patient reports central chest pain radiating to left arm onset noon today with mild SOB , no nausea or diaphoresis , denies cough or fever , history of CAD/Coronary Stent , his cardiologist is Dr. Burt Knack.

## 2019-12-18 NOTE — Progress Notes (Signed)
[facility] for Heparin Indication: chest pain/ACS  Allergies  Allergen Reactions  . Imdur [Isosorbide Nitrate] Diarrhea and Other (See Comments)    Headache, diarrhea  . Ivp Dye [Iodinated Diagnostic Agents] Other (See Comments)    headache    Patient Measurements: Height: 5\' 11"  (180.3 cm) Weight: (!) 110 kg (242 lb 8.1 oz) IBW/kg (Calculated) : 75.3 Heparin Dosing Weight: 98.9 kg  Vital Signs: Temp: 98.7 F (37.1 C) (07/29 1943) Temp Source: Oral (07/29 1943) BP: 127/83 (07/29 2230) Pulse Rate: 73 (07/29 2230)  Labs: Recent Labs    12/18/19 1958  HGB 14.6  HCT 44.5  PLT 208  LABPROT 14.0  INR 1.1  CREATININE 1.07  TROPONINIHS 744*    Estimated Creatinine Clearance: 84.5 mL/min (by C-G formula based on SCr of 1.07 mg/dL).   Medical History: Past Medical History:  Diagnosis Date  . Arthritis   . CAD (coronary artery disease)    a. DES to OM 1 and DES mLCX (12/05/13) b. CABG x1  ('92) c. 6 stents placed in 2009? (pt hx)   . Diabetes mellitus without complication ([place])    type 2  . Essential hypertension, benign   . Heart murmur    as a child   . History of kidney stones   . Lumbago   . Mixed hyperlipidemia   . Myocardial infarction [facility]) age 3  . Non Hodgkin's lymphoma ([place]) 2002   a. stage IV, chemo done  . Personal history of colonic polyps     Medications:  Scheduled:  . aspirin  324 mg Oral Once  . aspirin  324 mg Oral NOW   Or  . aspirin  300 mg Rectal NOW  . [START ON 12/19/2019] aspirin EC  81 mg Oral Daily  . [START ON 12/19/2019] atorvastatin  80 mg Oral Daily  . heparin  4,000 Units Intravenous Once  . [START ON 12/19/2019] insulin aspart  0-15 Units Subcutaneous TID WC  . insulin aspart  0-5 Units Subcutaneous QHS  . sodium chloride flush  3 mL Intravenous Once    Assessment: Patient is a 69 yom that presents to the ED with CP. On evaluation  the patient was found to have an elevated trop of 744.  Pharmacy has been asked to start heparin at this time for ACS.   Goal of Therapy:  Heparin level 0.3-0.7 units/ml Monitor platelets by anticoagulation protocol: Yes   Plan:  - Heparin bolus 4000 units IV x 1 dose - Heparin drip @ 1150 units/hr - Heparin level in ~ 6 hours  - Monitor patient for s/s of bleeding and CBC while on heparin   Duanne Limerick PharmD. BCPS  67/29/2021,10:39 PM

## 2019-12-18 NOTE — ED Notes (Signed)
Patient will be moved from waiting area to the next available bed ASAP.

## 2019-12-19 ENCOUNTER — Observation Stay (HOSPITAL_BASED_OUTPATIENT_CLINIC_OR_DEPARTMENT_OTHER): Payer: Medicare Other

## 2019-12-19 ENCOUNTER — Inpatient Hospital Stay (HOSPITAL_COMMUNITY)
Admission: EM | Disposition: A | Discharge status: Home / Self Care | Payer: Self-pay | Source: Home / Self Care | Attending: Cardiovascular Disease

## 2019-12-19 ENCOUNTER — Encounter (HOSPITAL_COMMUNITY): Payer: Self-pay | Admitting: Cardiology

## 2019-12-19 DIAGNOSIS — Z91041 Radiographic dye allergy status: Secondary | ICD-10-CM | POA: Diagnosis not present

## 2019-12-19 DIAGNOSIS — I10 Essential (primary) hypertension: Secondary | ICD-10-CM | POA: Diagnosis not present

## 2019-12-19 DIAGNOSIS — Z7984 Long term (current) use of oral hypoglycemic drugs: Secondary | ICD-10-CM | POA: Diagnosis not present

## 2019-12-19 DIAGNOSIS — E785 Hyperlipidemia, unspecified: Secondary | ICD-10-CM

## 2019-12-19 DIAGNOSIS — Z955 Presence of coronary angioplasty implant and graft: Secondary | ICD-10-CM | POA: Diagnosis not present

## 2019-12-19 DIAGNOSIS — I2510 Atherosclerotic heart disease of native coronary artery without angina pectoris: Secondary | ICD-10-CM

## 2019-12-19 DIAGNOSIS — I252 Old myocardial infarction: Secondary | ICD-10-CM | POA: Diagnosis not present

## 2019-12-19 DIAGNOSIS — Z20822 Contact with and (suspected) exposure to covid-19: Secondary | ICD-10-CM | POA: Diagnosis present

## 2019-12-19 DIAGNOSIS — M1990 Unspecified osteoarthritis, unspecified site: Secondary | ICD-10-CM | POA: Diagnosis present

## 2019-12-19 DIAGNOSIS — E119 Type 2 diabetes mellitus without complications: Secondary | ICD-10-CM | POA: Diagnosis present

## 2019-12-19 DIAGNOSIS — Z87442 Personal history of urinary calculi: Secondary | ICD-10-CM | POA: Diagnosis not present

## 2019-12-19 DIAGNOSIS — E782 Mixed hyperlipidemia: Secondary | ICD-10-CM | POA: Diagnosis present

## 2019-12-19 DIAGNOSIS — I214 Non-ST elevation (NSTEMI) myocardial infarction: Secondary | ICD-10-CM | POA: Diagnosis not present

## 2019-12-19 DIAGNOSIS — Z951 Presence of aortocoronary bypass graft: Secondary | ICD-10-CM | POA: Diagnosis not present

## 2019-12-19 DIAGNOSIS — Z79899 Other long term (current) drug therapy: Secondary | ICD-10-CM | POA: Diagnosis not present

## 2019-12-19 DIAGNOSIS — I249 Acute ischemic heart disease, unspecified: Secondary | ICD-10-CM | POA: Diagnosis not present

## 2019-12-19 DIAGNOSIS — R519 Headache, unspecified: Secondary | ICD-10-CM | POA: Diagnosis not present

## 2019-12-19 DIAGNOSIS — I2582 Chronic total occlusion of coronary artery: Secondary | ICD-10-CM | POA: Diagnosis present

## 2019-12-19 DIAGNOSIS — C8590 Non-Hodgkin lymphoma, unspecified, unspecified site: Secondary | ICD-10-CM | POA: Diagnosis present

## 2019-12-19 DIAGNOSIS — Z888 Allergy status to other drugs, medicaments and biological substances status: Secondary | ICD-10-CM | POA: Diagnosis not present

## 2019-12-19 HISTORY — PX: LEFT HEART CATH AND CORS/GRAFTS ANGIOGRAPHY: CATH118250

## 2019-12-19 LAB — LIPID PANEL
Cholesterol: 104 mg/dL (ref 0–200)
HDL: 25 mg/dL — ABNORMAL LOW (ref >40)
LDL Cholesterol: 14 mg/dL (ref 0–99)
Total CHOL/HDL Ratio: 4.2 RATIO
Triglycerides: 326 mg/dL — ABNORMAL HIGH (ref <150)
VLDL: 65 mg/dL — ABNORMAL HIGH (ref 0–40)

## 2019-12-19 LAB — GLUCOSE, CAPILLARY
Glucose-Capillary: 113 mg/dL — ABNORMAL HIGH (ref 70–99)
Glucose-Capillary: 109 mg/dL — ABNORMAL HIGH (ref 70–99)
Glucose-Capillary: 215 mg/dL — ABNORMAL HIGH (ref 70–99)

## 2019-12-19 LAB — ECHOCARDIOGRAM COMPLETE
Area-P 1/2: 3.27 cm2
Calc EF: 53.1 %
Height: 71 in
S' Lateral: 4 cm
Single Plane A2C EF: 58.6 %
Single Plane A4C EF: 47.6 %
Weight: 3880.1 oz

## 2019-12-19 LAB — CBC
HCT: 40.1 % (ref 39.0–52.0)
Hemoglobin: 13 g/dL (ref 13.0–17.0)
MCH: 30.1 pg (ref 26.0–34.0)
MCHC: 32.4 g/dL (ref 30.0–36.0)
MCV: 92.8 fL (ref 80.0–100.0)
Platelets: 174 10*3/uL (ref 150–400)
RBC: 4.32 MIL/uL (ref 4.22–5.81)
RDW: 13.2 % (ref 11.5–15.5)
WBC: 6.5 10*3/uL (ref 4.0–10.5)
nRBC: 0 % (ref 0.0–0.2)

## 2019-12-19 LAB — BASIC METABOLIC PANEL
Anion gap: 8 (ref 5–15)
BUN: 7 mg/dL — ABNORMAL LOW (ref 8–23)
CO2: 28 mmol/L (ref 22–32)
Calcium: 9 mg/dL (ref 8.9–10.3)
Chloride: 102 mmol/L (ref 98–111)
Creatinine, Ser: 1.01 mg/dL (ref 0.61–1.24)
GFR calc Af Amer: >60 mL/min (ref >60)
GFR calc non Af Amer: >60 mL/min (ref >60)
Glucose, Bld: 149 mg/dL — ABNORMAL HIGH (ref 70–99)
Potassium: 3.6 mmol/L (ref 3.5–5.1)
Sodium: 138 mmol/L (ref 135–145)

## 2019-12-19 LAB — PROTIME-INR
INR: 1.1 (ref 0.8–1.2)
Prothrombin Time: 13.5 seconds (ref 11.4–15.2)

## 2019-12-19 LAB — SARS CORONAVIRUS 2 BY RT PCR (HOSPITAL ORDER, PERFORMED IN ~~LOC~~ HOSPITAL LAB): SARS Coronavirus 2: NEGATIVE

## 2019-12-19 LAB — HEPARIN LEVEL (UNFRACTIONATED)
Heparin Unfractionated: 0.46 IU/mL (ref 0.30–0.70)
Heparin Unfractionated: 0.41 IU/mL (ref 0.30–0.70)

## 2019-12-19 LAB — APTT: aPTT: 56 seconds — ABNORMAL HIGH (ref 24–36)

## 2019-12-19 LAB — HIV ANTIBODY (ROUTINE TESTING W REFLEX): HIV Screen 4th Generation wRfx: NONREACTIVE

## 2019-12-19 LAB — TROPONIN I (HIGH SENSITIVITY): Troponin I (High Sensitivity): 3433 ng/L (ref <18)

## 2019-12-19 LAB — CBG MONITORING, ED: Glucose-Capillary: 140 mg/dL — ABNORMAL HIGH (ref 70–99)

## 2019-12-19 SURGERY — LEFT HEART CATH AND CORS/GRAFTS ANGIOGRAPHY
Anesthesia: LOCAL

## 2019-12-19 MED ORDER — HEPARIN SODIUM (PORCINE) 1000 UNIT/ML IJ SOLN
INTRAMUSCULAR | Status: DC | PRN
  Administered 2019-12-19: 5000 [IU] via INTRAVENOUS

## 2019-12-19 MED ORDER — ENOXAPARIN SODIUM 40 MG/0.4ML ~~LOC~~ SOLN
40.0000 mg | SUBCUTANEOUS | Status: DC
  Administered 2019-12-20: 40 mg via SUBCUTANEOUS
  Filled 2019-12-19: qty 0.4

## 2019-12-19 MED ORDER — SODIUM CHLORIDE 0.9 % WEIGHT BASED INFUSION: 1.0000 mL/kg/h | INTRAVENOUS | Status: DC

## 2019-12-19 MED ORDER — LIDOCAINE HCL (PF) 1 % IJ SOLN
INTRAMUSCULAR | Status: DC | PRN
  Administered 2019-12-19: 2 mL

## 2019-12-19 MED ORDER — SODIUM CHLORIDE 0.9 % IV SOLN: 250.0000 mL | INTRAVENOUS | Status: DC | PRN

## 2019-12-19 MED ORDER — NITROGLYCERIN 1 MG/10 ML FOR IR/CATH LAB
INTRA_ARTERIAL | Status: AC
  Filled 2019-12-19: qty 10

## 2019-12-19 MED ORDER — LIDOCAINE HCL (PF) 1 % IJ SOLN
INTRAMUSCULAR | Status: AC
  Filled 2019-12-19: qty 30

## 2019-12-19 MED ORDER — CLOPIDOGREL BISULFATE 75 MG PO TABS
75.0000 mg | ORAL_TABLET | Freq: Every day | ORAL | Status: DC
  Administered 2019-12-20: 75 mg via ORAL
  Filled 2019-12-19: qty 1

## 2019-12-19 MED ORDER — VERAPAMIL HCL 2.5 MG/ML IV SOLN
INTRAVENOUS | Status: DC | PRN
  Administered 2019-12-19: 10 mL via INTRA_ARTERIAL

## 2019-12-19 MED ORDER — FENTANYL CITRATE (PF) 100 MCG/2ML IJ SOLN
INTRAMUSCULAR | Status: AC
  Filled 2019-12-19: qty 2

## 2019-12-19 MED ORDER — MIDAZOLAM HCL 2 MG/2ML IJ SOLN
INTRAMUSCULAR | Status: DC | PRN
  Administered 2019-12-19: 1 mg via INTRAVENOUS

## 2019-12-19 MED ORDER — ISOSORBIDE MONONITRATE ER 60 MG PO TB24: 60.0000 mg | ORAL_TABLET | Freq: Every day | ORAL | Status: DC

## 2019-12-19 MED ORDER — SODIUM CHLORIDE 0.9% FLUSH
3.0000 mL | Freq: Two times a day (BID) | INTRAVENOUS | Status: DC
  Administered 2019-12-20: 3 mL via INTRAVENOUS

## 2019-12-19 MED ORDER — SODIUM CHLORIDE 0.9% FLUSH: 3.0000 mL | INTRAVENOUS | Status: DC | PRN

## 2019-12-19 MED ORDER — HEPARIN SODIUM (PORCINE) 1000 UNIT/ML IJ SOLN
INTRAMUSCULAR | Status: AC
  Filled 2019-12-19: qty 1

## 2019-12-19 MED ORDER — FENTANYL CITRATE (PF) 100 MCG/2ML IJ SOLN
INTRAMUSCULAR | Status: DC | PRN
  Administered 2019-12-19: 25 ug via INTRAVENOUS

## 2019-12-19 MED ORDER — MIDAZOLAM HCL 2 MG/2ML IJ SOLN
INTRAMUSCULAR | Status: AC
  Filled 2019-12-19: qty 2

## 2019-12-19 MED ORDER — IOHEXOL 350 MG/ML SOLN
INTRAVENOUS | Status: AC
  Filled 2019-12-19: qty 1

## 2019-12-19 MED ORDER — SODIUM CHLORIDE 0.9 % WEIGHT BASED INFUSION
1.0000 mL/kg/h | INTRAVENOUS | Status: AC
  Administered 2019-12-19: 1 mL/kg/h via INTRAVENOUS

## 2019-12-19 MED ORDER — ISOSORBIDE MONONITRATE ER 30 MG PO TB24
15.0000 mg | ORAL_TABLET | Freq: Every day | ORAL | Status: DC
  Administered 2019-12-19 – 2019-12-20 (×2): 15 mg via ORAL
  Filled 2019-12-19 (×2): qty 1

## 2019-12-19 MED ORDER — SODIUM CHLORIDE 0.9% FLUSH: 3.0000 mL | Freq: Two times a day (BID) | INTRAVENOUS | Status: DC

## 2019-12-19 MED ORDER — PERFLUTREN LIPID MICROSPHERE
1.0000 mL | INTRAVENOUS | Status: AC | PRN
  Administered 2019-12-19: 2 mL via INTRAVENOUS
  Filled 2019-12-19: qty 10

## 2019-12-19 MED ORDER — VERAPAMIL HCL 2.5 MG/ML IV SOLN
INTRAVENOUS | Status: AC
  Filled 2019-12-19: qty 2

## 2019-12-19 MED ORDER — SODIUM CHLORIDE 0.9 % WEIGHT BASED INFUSION
3.0000 mL/kg/h | INTRAVENOUS | Status: DC
  Administered 2019-12-19: 3 mL/kg/h via INTRAVENOUS

## 2019-12-19 MED ORDER — HEPARIN (PORCINE) IN NACL 1000-0.9 UT/500ML-% IV SOLN
INTRAVENOUS | Status: DC | PRN
  Administered 2019-12-19 (×2): 500 mL

## 2019-12-19 MED ORDER — IOHEXOL 350 MG/ML SOLN
INTRAVENOUS | Status: DC | PRN
  Administered 2019-12-19: 70 mL

## 2019-12-19 MED ORDER — HEPARIN (PORCINE) IN NACL 1000-0.9 UT/500ML-% IV SOLN
INTRAVENOUS | Status: AC
  Filled 2019-12-19: qty 1000

## 2019-12-19 SURGICAL SUPPLY — 9 items
CATH INFINITI 5FR MULTPACK ANG (CATHETERS) ×1 IMPLANT
GLIDESHEATH SLEND SS 6F .021 (SHEATH) ×1 IMPLANT
GUIDEWIRE INQWIRE 1.5J.035X260 (WIRE) IMPLANT
INQWIRE 1.5J .035X260CM (WIRE) ×2
KIT HEART LEFT (KITS) ×2 IMPLANT
PACK CARDIAC CATHETERIZATION (CUSTOM PROCEDURE TRAY) ×2 IMPLANT
SYR MEDRAD MARK 7 150ML (SYRINGE) ×2 IMPLANT
TRANSDUCER W/STOPCOCK (MISCELLANEOUS) ×2 IMPLANT
TUBING CIL FLEX 10 FLL-RA (TUBING) ×2 IMPLANT

## 2019-12-19 NOTE — Progress Notes (Signed)
  Echocardiogram 2D Echocardiogram with definity has been performed.  Ryan Vaughn M 12/19/2019, 8:16 AM

## 2019-12-19 NOTE — Progress Notes (Addendum)
Discussed with Dr. Gwenlyn Found, given occluded LCx not amenable to PCI, will attempt to wean him off of IV nitro and start him on Imdur. May consider D/C tomorrow.   Patient says he had severe headache on 30mg  daily of imdur in the past. Will order 15mg  daily while weaning off IV nitro. If still have chest pain in the morning, will add Renaxa. Patient was on aspirin and 2.5mg  Xarelto, will switch Xarelto to plavix.

## 2019-12-19 NOTE — Progress Notes (Addendum)
Progress Note  Patient Name: Ryan Vaughn Date of Encounter: 12/19/2019  [facility] Cardiologist: Kate Sable, MD (Inactive)   Subjective   Admitted last night with chest pain and non-STEMI.  Currently on IV heparin.  Denies chest pain or shortness of breath.  Inpatient Medications    Scheduled Meds: . aspirin EC  81 mg Oral Daily  . atorvastatin  80 mg Oral Daily  . insulin aspart  0-15 Units Subcutaneous TID WC  . insulin aspart  0-5 Units Subcutaneous QHS  . sodium chloride flush  3 mL Intravenous Once   Continuous Infusions: . heparin 1,150 Units/hr (12/19/19 0715)  . nitroGLYCERIN 35 mcg/min (12/19/19 0755)   PRN Meds: acetaminophen, ALPRAZolam, morphine injection, nitroGLYCERIN, nitroGLYCERIN, ondansetron (ZOFRAN) IV, zolpidem   Vital Signs    Vitals:   12/19/19 0715 12/19/19 0730 12/19/19 0735 12/19/19 0740  BP: 98/73 98/75 97/73  101/76  Pulse: 69 66 64 70  Resp:      Temp:      TempSrc:      SpO2: 97% 98% 98% 94%  Weight:      Height:        Intake/Output Summary (Last 24 hours) at 12/19/2019 0756 Last data filed at 12/19/2019 0715 Gross per 24 hour  Intake 133.04 ml  Output 200 ml  Net -66.96 ml   Last 3 Weights 12/18/2019 10/01/2019 09/26/2019  Weight (lbs) 242 lb 8.1 oz 227 lb 12.8 oz 215 lb  Weight (kg) 110 kg 103.329 kg 97.523 kg      Telemetry    Sinus rhythm- Personally Reviewed  ECG    Sinus rhythm at 62 RSR prime in lead V1 consistent with RV conduction- Personally Reviewed  Physical Exam   GEN: No acute distress.   Neck: No JVD Cardiac: RRR, no murmurs, rubs, or gallops.  Respiratory: Clear to auscultation bilaterally. GI: Soft, nontender, non-distended  MS: No edema; No deformity. Neuro:  Nonfocal  Psych: Normal affect   Labs    High Sensitivity Troponin:   Recent Labs  Lab 12/18/19 1958 12/18/19 2231 12/19/19 0542  TROPONINIHS 744* 1,151* 3,433*      Chemistry Recent Labs  Lab 12/18/19 1958  12/19/19 0542  NA 138 138  K 3.8 3.6  CL 102 102  CO2 27 28  GLUCOSE 168* 149*  BUN 8 7*  CREATININE 1.07 1.01  CALCIUM 9.3 9.0  GFRNONAA >60 >60  GFRAA >60 >60  ANIONGAP 9 8     Hematology Recent Labs  Lab 12/18/19 1958 12/19/19 0542  WBC 8.1 6.5  RBC 4.90 4.32  HGB 14.6 13.0  HCT 44.5 40.1  MCV 90.8 92.8  MCH 29.8 30.1  MCHC 32.8 32.4  RDW 13.1 13.2  PLT 208 174    BNPNo results for input(s): BNP, PROBNP in the last 168 hours.   DDimer No results for input(s): DDIMER in the last 168 hours.   Radiology    DG Chest 2 View  Result Date: 12/18/2019 CLINICAL DATA:  Chest pain. EXAM: CHEST - 2 VIEW COMPARISON:  December 15, 2018. FINDINGS: The heart size and mediastinal contours are within normal limits. Both lungs are clear. No pneumothorax or pleural effusion is noted. Sternotomy wires are noted. The visualized skeletal structures are unremarkable. IMPRESSION: No active cardiopulmonary disease. Electronically Signed   By: Marijo Conception M.D.   On: 12/18/2019 20:10    Cardiac Studies   None performed  Review of bedside echo suggested preserved LV function although official reading  is not yet been rendered  Patient Profile     Ryan Vaughn is a 69 y.o. male with h/o CAD s/p single-vessel CABG (LIMA-LAD), multiple PCI in the RCA and LCx territories, last LHC in July 2020 showing patent LIMA and prior stents w/ medical mgmt recommended, now presents w/ CP and elevated HS trop.  Assessment & Plan    1: Coronary artery disease-history of CAD status post bypass grafting x1 in 1992 with a LIMA to the LAD.  History of stenting of the circumflex and RCA by Dr. Irish Lack back in 2017.  Left cardiac catheterization performed by Dr. Burt Knack 12/16/2018 revealed patent LIMA to the LAD and patent stents in the RCA and circumflex.  He was treated medically.  He developed chest pain yesterday.  Troponins elevated to 3000.  Currently pain-free on IV heparin.  Plan for diagnostic for  angiography today.  2: Essential hypertension-blood pressure somewhat soft today.  He is on metoprolol for hypertension  3: Hyperlipidemia-on Crestor with lipid profile performed today revealing total cholesterol 104, LDL 14 HDL 25.  For questions or updates, please contact [facility] Please consult [email or web address] for contact info under        Signed, Quay Burow, MD  12/19/2019, 7:56 AM     Addendum: Results of cath noted.  RCA stent widely patent.  LIMA to LAD widely patent.  AV groove stent appears to have been occluded and not ideal for percutaneous revascularization .  The ostium of the first OM branch is unchanged from prior cath.  Recommend medical therapy.  Will wean IV nitro off and begin Imdur p.o.  Anticipate discharge in the next 24 to 48 hours.  Lorretta Harp, M.D., [place], [facility], Laverta [place]man [address] [place], [place]  [address]  [phone] 12/19/2019 4:56 PM

## 2019-12-19 NOTE — Progress Notes (Signed)
[facility] for Heparin Indication: chest pain/ACS  Allergies  Allergen Reactions  . Imdur [Isosorbide Nitrate] Diarrhea and Other (See Comments)    Headache, diarrhea  . Ivp Dye [Iodinated Diagnostic Agents] Other (See Comments)    headache    Patient Measurements: Height: 5\' 11"  (180.3 cm) Weight: (!) 110 kg (242 lb 8.1 oz) IBW/kg (Calculated) : 75.3 Heparin Dosing Weight: 98.9 kg  Vital Signs: Temp: 98.7 F (37.1 C) (07/29 1943) Temp Source: Oral (07/29 1943) BP: 97/70 (07/30 0644) Pulse Rate: 61 (07/30 0644)  Labs: Recent Labs    12/18/19 1958 12/18/19 2231 12/19/19 0542  HGB 14.6  --  13.0  HCT 44.5  --  40.1  PLT 208  --  174  LABPROT 14.0  --  13.5  INR 1.1  --  1.1  HEPARINUNFRC  --   --  0.46  CREATININE 1.07  --   --   TROPONINIHS 744* 1,151*  --     Estimated Creatinine Clearance: 84.5 mL/min (by C-G formula based on SCr of 1.07 mg/dL).   Medical History: Past Medical History:  Diagnosis Date  . Arthritis   . CAD (coronary artery disease)    a. DES to OM 1 and DES mLCX (12/05/13) b. CABG x1  ('92) c. 6 stents placed in 2009? (pt hx)   . Diabetes mellitus without complication ([place])    type 2  . Essential hypertension, benign   . Heart murmur    as a child   . History of kidney stones   . Lumbago   . Mixed hyperlipidemia   . Myocardial infarction [facility]) age 10  . Non Hodgkin's lymphoma ([place]) 2002   a. stage IV, chemo done  . Personal history of colonic polyps     Medications:  Scheduled:  . aspirin EC  81 mg Oral Daily  . atorvastatin  80 mg Oral Daily  . insulin aspart  0-15 Units Subcutaneous TID WC  . insulin aspart  0-5 Units Subcutaneous QHS  . sodium chloride flush  3 mL Intravenous Once    Assessment: Patient is a 69 yom that presents to the ED with CP. On evaluation  the patient was found to have an elevated trop of 744. Pharmacy has been asked to start heparin at this time for ACS.   7/30  AM update:  Heparin level therapeutic   Goal of Therapy:  Heparin level 0.3-0.7 units/ml Monitor platelets by anticoagulation protocol: Yes   Plan:  Cont heparin 1150 units/hr 1200 heparin level  Narda Bonds, PharmD, BCPS Clinical Pharmacist Phone: [phone]

## 2019-12-19 NOTE — Progress Notes (Addendum)
[place] for Heparin Indication: chest pain/ACS  Allergies  Allergen Reactions  . Imdur [Isosorbide Nitrate] Diarrhea and Other (See Comments)    Headache, diarrhea  . Ivp Dye [Iodinated Diagnostic Agents] Other (See Comments)    headache    Patient Measurements: Height: 5\' 11"  (180.3 cm) Weight: (!) 110 kg (242 lb 8.1 oz) IBW/kg (Calculated) : 75.3 Heparin Dosing Weight: 98.9 kg  Vital Signs: Temp: 97.4 F (36.3 C) (07/30 1156) Temp Source: Oral (07/30 1156) BP: 101/75 (07/30 1156) Pulse Rate: 64 (07/30 1156)  Labs: Recent Labs    12/18/19 1958 12/18/19 2231 12/19/19 0542  HGB 14.6  --  13.0  HCT 44.5  --  40.1  PLT 208  --  174  LABPROT 14.0  --  13.5  INR 1.1  --  1.1  HEPARINUNFRC  --   --  0.46  CREATININE 1.07  --  1.01  TROPONINIHS 744* 1,151* 3,433*    Estimated Creatinine Clearance: 89.5 mL/min (by C-G formula based on SCr of 1.01 mg/dL).   Medical History: Past Medical History:  Diagnosis Date  . Arthritis   . CAD (coronary artery disease)    a. DES to OM 1 and DES mLCX (12/05/13) b. CABG x1  ('92) c. 6 stents placed in 2009? (pt hx)   . Diabetes mellitus without complication ([place])    type 2  . Essential hypertension, benign   . Heart murmur    as a child   . History of kidney stones   . Lumbago   . Mixed hyperlipidemia   . Myocardial infarction [facility]) age 69 [facility]) age 75  . Non Hodgkin's lymphoma ([place]) 2002   a. stage IV, chemo done  . Personal history of colonic polyps     Medications:  Scheduled:  . aspirin EC  81 mg Oral Daily  . atorvastatin  80 mg Oral Daily  . insulin aspart  0-15 Units Subcutaneous TID WC  . insulin aspart  0-5 Units Subcutaneous QHS  . sodium chloride flush  3 mL Intravenous Once    Assessment: Patient is a 69 yom that presents to the ED with CP. On evaluation  the patient was found to have an elevated trop of 744. Pharmacy has been asked to start heparin at this time for ACS.    Heparin level this afternoon remains therapeutic (HL 0.41 << 0.46, goal of 0.3-0.7). CBC wnl - no bleeding noted at this time.    Goal of Therapy:  Heparin level 0.3-0.7 units/ml Monitor platelets by anticoagulation protocol: Yes   Plan:  - Continue Heparin at 1150 units/hr (11.5 ml/hr) - Will continue to monitor for any signs/symptoms of bleeding and will follow up with heparin level in the AM   Addendum: Given history of PTA Xarelto, checked an aPTT as unclear if the patient's heparin level is falsely elevated, aPTT was low at 56 - will increase heparin and f/u again this evening vs s/p cath.  Plan - Increase Heparin to 1300 units/hr - Will continue to monitor for any signs/symptoms of bleeding and will follow up with heparin level in 6 hours vs s/p cath   Thank you for allowing pharmacy to be a part of this patient's care.  Alycia Rossetti, PharmD, BCPS Clinical Pharmacist Clinical phone for 12/19/2019: [phone] 12/19/2019 1:12 PM   **Pharmacist phone directory can now be found on [email or web address] ([email or web address]).  Listed under [place].

## 2019-12-19 NOTE — Progress Notes (Signed)
TR band removed. No bleeding. Tegaderm and gauze applied.

## 2019-12-19 NOTE — Progress Notes (Signed)
Received pt from ED alert and oriented, per pt CP has been ongoing 5/10. On Nitro drip @ 12 ml/hr and Heparin drip @ 11.50ml.  Prep for CaCath this afternoon.

## 2019-12-20 ENCOUNTER — Encounter (HOSPITAL_COMMUNITY): Payer: Self-pay | Admitting: Cardiology

## 2019-12-20 DIAGNOSIS — E119 Type 2 diabetes mellitus without complications: Secondary | ICD-10-CM

## 2019-12-20 LAB — CBC
HCT: 37.5 % — ABNORMAL LOW (ref 39.0–52.0)
Hemoglobin: 12.3 g/dL — ABNORMAL LOW (ref 13.0–17.0)
MCH: 29.9 pg (ref 26.0–34.0)
MCHC: 32.8 g/dL (ref 30.0–36.0)
MCV: 91 fL (ref 80.0–100.0)
Platelets: 158 10*3/uL (ref 150–400)
RBC: 4.12 MIL/uL — ABNORMAL LOW (ref 4.22–5.81)
RDW: 13.2 % (ref 11.5–15.5)
WBC: 7 10*3/uL (ref 4.0–10.5)
nRBC: 0 % (ref 0.0–0.2)

## 2019-12-20 LAB — GLUCOSE, CAPILLARY
Glucose-Capillary: 192 mg/dL — ABNORMAL HIGH (ref 70–99)
Glucose-Capillary: 281 mg/dL — ABNORMAL HIGH (ref 70–99)
Glucose-Capillary: 126 mg/dL — ABNORMAL HIGH (ref 70–99)

## 2019-12-20 MED ORDER — ROSUVASTATIN CALCIUM 20 MG PO TABS: 40.0000 mg | ORAL_TABLET | Freq: Every day | ORAL | Status: DC

## 2019-12-20 MED ORDER — CLOPIDOGREL BISULFATE 75 MG PO TABS: 75.0000 mg | ORAL_TABLET | Freq: Every day | ORAL | 6 refills | Status: DC

## 2019-12-20 MED ORDER — RANOLAZINE ER 500 MG PO TB12: 500.0000 mg | ORAL_TABLET | Freq: Two times a day (BID) | ORAL | 6 refills | Status: DC

## 2019-12-20 MED ORDER — ASPIRIN 81 MG PO TBEC: 81.0000 mg | DELAYED_RELEASE_TABLET | Freq: Every day | ORAL | 11 refills | Status: AC

## 2019-12-20 MED ORDER — RANOLAZINE ER 500 MG PO TB12
500.0000 mg | ORAL_TABLET | Freq: Two times a day (BID) | ORAL | Status: DC
  Administered 2019-12-20: 500 mg via ORAL
  Filled 2019-12-20: qty 1

## 2019-12-20 MED ORDER — METOPROLOL SUCCINATE ER 50 MG PO TB24
50.0000 mg | ORAL_TABLET | Freq: Every day | ORAL | Status: DC
  Administered 2019-12-20: 50 mg via ORAL
  Filled 2019-12-20: qty 1

## 2019-12-20 NOTE — Progress Notes (Signed)
Progress Note  Patient Name: Ryan Vaughn Date of Encounter: 12/20/2019  [facility] Cardiologist: Kate Sable, MD (Inactive)  Subjective   Headache, mild 3/10 chest pain  Inpatient Medications    Scheduled Meds:  aspirin EC  81 mg Oral Daily   atorvastatin  80 mg Oral Daily   clopidogrel  75 mg Oral Daily   enoxaparin (LOVENOX) injection  40 mg Subcutaneous Q24H   insulin aspart  0-15 Units Subcutaneous TID WC   insulin aspart  0-5 Units Subcutaneous QHS   isosorbide mononitrate  15 mg Oral Daily   sodium chloride flush  3 mL Intravenous Once   sodium chloride flush  3 mL Intravenous Q12H   Continuous Infusions:  sodium chloride     nitroGLYCERIN Stopped (12/20/19 0107)   PRN Meds: sodium chloride, acetaminophen, ALPRAZolam, morphine injection, nitroGLYCERIN, nitroGLYCERIN, ondansetron (ZOFRAN) IV, sodium chloride flush, zolpidem   Vital Signs    Vitals:   12/19/19 1954 12/19/19 2100 12/20/19 0000 12/20/19 0400  BP: 103/70  102/68 108/67  Pulse: 76  78 76  Resp: 22  20 19   Temp: 98.4 F (36.9 C) 98.4 F (36.9 C) 99.1 F (37.3 C) 98.4 F (36.9 C)  TempSrc: Oral  Oral Oral  SpO2: 96%  95% 96%  Weight:   (!) 98.6 kg   Height:        Intake/Output Summary (Last 24 hours) at 12/20/2019 0918 Last data filed at 12/20/2019 0838 Gross per 24 hour  Intake 1089.36 ml  Output 1475 ml  Net -385.64 ml   Last 3 Weights 12/20/2019 12/18/2019 10/01/2019  Weight (lbs) 217 lb 4.8 oz 242 lb 8.1 oz 227 lb 12.8 oz  Weight (kg) 98.567 kg 110 kg 103.329 kg      Telemetry    NSR - Personally Reviewed  ECG    n/a- Personally Reviewed  Physical Exam   GEN: No acute distress.   Neck: No JVD Cardiac: RRR, no murmurs, rubs, or gallops.  Respiratory: Clear to auscultation bilaterally. GI: Soft, nontender, non-distended  MS: No edema; No deformity. Neuro:  Nonfocal  Psych: Normal affect   Labs    High Sensitivity Troponin:   Recent Labs  Lab  12/18/19 1958 12/18/19 2231 12/19/19 0542  TROPONINIHS 744* 1,151* 3,433*      Chemistry Recent Labs  Lab 12/18/19 1958 12/19/19 0542  NA 138 138  K 3.8 3.6  CL 102 102  CO2 27 28  GLUCOSE 168* 149*  BUN 8 7*  CREATININE 1.07 1.01  CALCIUM 9.3 9.0  GFRNONAA >60 >60  GFRAA >60 >60  ANIONGAP 9 8     Hematology Recent Labs  Lab 12/18/19 1958 12/19/19 0542 12/20/19 0342  WBC 8.1 6.5 7.0  RBC 4.90 4.32 4.12*  HGB 14.6 13.0 12.3*  HCT 44.5 40.1 37.5*  MCV 90.8 92.8 91.0  MCH 29.8 30.1 29.9  MCHC 32.8 32.4 32.8  RDW 13.1 13.2 13.2  PLT 208 174 158    BNPNo results for input(s): BNP, PROBNP in the last 168 hours.   DDimer No results for input(s): DDIMER in the last 168 hours.   Radiology    DG Chest 2 View  Result Date: 12/18/2019 CLINICAL DATA:  Chest pain. EXAM: CHEST - 2 VIEW COMPARISON:  December 15, 2018. FINDINGS: The heart size and mediastinal contours are within normal limits. Both lungs are clear. No pneumothorax or pleural effusion is noted. Sternotomy wires are noted. The visualized skeletal structures are unremarkable. IMPRESSION: No active  cardiopulmonary disease. Electronically Signed   By: Marijo Conception M.D.   On: 12/18/2019 20:10   CARDIAC CATHETERIZATION  Result Date: 12/19/2019  Prox LAD lesion is 100% stenosed.  Previously placed 1st Mrg stent (unknown type) is widely patent.  Prox Cx to Dist Cx lesion is 100% stenosed.  Previously placed Prox RCA to Mid RCA stent (unknown type) is widely patent.  LIMA graft was visualized by angiography and is normal in caliber.  The graft exhibits no disease.  The left ventricular systolic function is normal.  LV end diastolic pressure is mildly elevated.  The left ventricular ejection fraction is 55-65% by visual estimate.  1. 2 vessel occlusive CAD    - 100% proximal LAD    - 100% mid LCx. This vessel had multiple prior stents 2. Continued patency of stents in OM1 and the RCA 3. Patent LIMA to the LAD 4.  Normal LV function 5. Mildly elevated LVEDP Plan: NSTEMI is due to occlusion of the mid LCx. This is not salvageable. Continue medical therapy.   ECHOCARDIOGRAM COMPLETE  Result Date: 12/19/2019    ECHOCARDIOGRAM REPORT   Patient Name:   Ryan Vaughn Date of Exam: 12/19/2019 Medical Rec #:  [id number]      Height:       71.0 in Accession #:    [id number]     Weight:       242.5 lb Date of Birth:  Feb 09, 1951      BSA:          2.289 m Patient Age:    69 years       BP:           96/64 mmHg Patient Gender: M              HR:           64 bpm. Exam Location:  Inpatient Procedure: 2D Echo and Intracardiac Opacification Agent Indications:    Acute Cornonary Syndrome I24.9  History:        Patient has prior history of Echocardiogram examinations, most                 recent 12/16/2018. CAD and Previous Myocardial Infarction, Prior                 CABG; Risk Factors:Hypertension, Diabetes and Dyslipidemia. Past                 history of Non Hodgkin's lymphoma.  Sonographer:    Darlina Sicilian RDCS Referring Phys: [id number] [place]  1. Left ventricular ejection fraction, by estimation, is 50 to 55%. The left ventricle has low normal function. The left ventricle has no regional wall motion abnormalities. Left ventricular diastolic parameters are consistent with Grade II diastolic dysfunction (pseudonormalization).  2. Right ventricular systolic function is normal. The right ventricular size is normal. There is normal pulmonary artery systolic pressure. The estimated right ventricular systolic pressure is 23.5 mmHg.  3. Left atrial size was mildly dilated.  4. The mitral valve is normal in structure. No evidence of mitral valve regurgitation. No evidence of mitral stenosis.  5. The aortic valve is normal in structure. Aortic valve regurgitation is not visualized. No aortic stenosis is present.  6. The inferior vena cava is normal in size with greater than 50% respiratory variability, suggesting  right atrial pressure of 3 mmHg. FINDINGS  Left Ventricle: Left ventricular ejection fraction, by estimation, is 50 to 55%. The left ventricle has  low normal function. The left ventricle has no regional wall motion abnormalities. Definity contrast agent was given IV to delineate the left ventricular endocardial borders. The left ventricular internal cavity size was normal in size. There is no left ventricular hypertrophy. Left ventricular diastolic parameters are consistent with Grade II diastolic dysfunction (pseudonormalization). Normal left ventricular filling pressure. Right Ventricle: The right ventricular size is normal. No increase in right ventricular wall thickness. Right ventricular systolic function is normal. There is normal pulmonary artery systolic pressure. The tricuspid regurgitant velocity is 2.12 m/s, and  with an assumed right atrial pressure of 3 mmHg, the estimated right ventricular systolic pressure is 71.0 mmHg. Left Atrium: Left atrial size was mildly dilated. Right Atrium: Right atrial size was normal in size. Pericardium: Trivial pericardial effusion is present. The pericardial effusion is localized near the right atrium. Mitral Valve: The mitral valve is normal in structure. Normal mobility of the mitral valve leaflets. No evidence of mitral valve regurgitation. No evidence of mitral valve stenosis. Tricuspid Valve: The tricuspid valve is normal in structure. Tricuspid valve regurgitation is mild . No evidence of tricuspid stenosis. Aortic Valve: The aortic valve is normal in structure. Aortic valve regurgitation is not visualized. No aortic stenosis is present. Pulmonic Valve: The pulmonic valve was normal in structure. Pulmonic valve regurgitation is trivial. No evidence of pulmonic stenosis. Aorta: The aortic root is normal in size and structure. Venous: The inferior vena cava is normal in size with greater than 50% respiratory variability, suggesting right atrial pressure of 3 mmHg.  IAS/Shunts: No atrial level shunt detected by color flow Doppler.  LEFT VENTRICLE PLAX 2D LVIDd:         5.30 cm      Diastology LVIDs:         4.00 cm      LV e' lateral:   8.70 cm/s LV PW:         1.00 cm      LV E/e' lateral: 8.9 LV IVS:        0.80 cm      LV e' medial:    6.42 cm/s LVOT diam:     1.90 cm      LV E/e' medial:  12.0 LV SV:         55 LV SV Index:   24 LVOT Area:     2.84 cm  LV Volumes (MOD) LV vol d, MOD A2C: 139.0 ml LV vol d, MOD A4C: 119.0 ml LV vol s, MOD A2C: 57.6 ml LV vol s, MOD A4C: 62.4 ml LV SV MOD A2C:     81.4 ml LV SV MOD A4C:     119.0 ml LV SV MOD BP:      69.6 ml RIGHT VENTRICLE RV S prime:     7.29 cm/s TAPSE (M-mode): 1.7 cm LEFT ATRIUM             Index LA Vol (A2C):   76.8 ml 33.56 ml/m LA Vol (A4C):   89.8 ml 39.24 ml/m LA Biplane Vol: 88.2 ml 38.54 ml/m  AORTIC VALVE LVOT Vmax:   91.40 cm/s LVOT Vmean:  61.500 cm/s LVOT VTI:    0.193 m  AORTA Ao Root diam: 3.30 cm MITRAL VALVE               TRICUSPID VALVE MV Area (PHT): 3.27 cm    TR Peak grad:   18.0 mmHg MV Decel Time: 232 msec    TR Vmax:  212.00 cm/s MV E velocity: 77.10 cm/s MV A velocity: 64.30 cm/s  SHUNTS MV E/A ratio:  1.20        Systemic VTI:  0.19 m                            Systemic Diam: 1.90 cm Fransico Him MD Electronically signed by Fransico Him MD Signature Date/Time: 12/19/2019/8:31:06 AM    Final     Cardiac Studies    Patient Profile     Christerpher Clos Bishopis a 69 y.o.malewith h/o CAD s/p single-vessel CABG (LIMA-LAD), multiple PCI in the RCA and LCx territories, last LHC in July 2020 showing patent LIMA and prior stents w/ medical mgmt recommended, now presents w/ CP and elevated HS trop.  Assessment & Plan    1. CAD/NSTEMI - h/o CAD s/p single-vessel CABG (LIMA-LAD), multiple PCI in the RCA and LCx territories,  -  Left cardiac catheterization performed by Dr. Burt Knack 12/16/2018 revealed patent LIMA to the LAD and patent stents in the RCA and circumflex.  He was treated  medically  - presented with NSTEMI - cath this admit LM normal, prox LAD occluded, LCX occluded, RCA patent. Patent LIMA-LAD graft. NSTEMI due to occlusion of LCX, not salvageable and recs for medical management - echo LVEF 50-55%, grade II DDx  - medical therapy with ASA 81, atorva 80, plavix 75, imdur 15 (from notes HA on higher imdur dosing in the past). Resume his home toprol, consider ACE/ARB as outpatient  - recurrent headaches on even low dose imdur, will d/c. Soft bp's at times, start ranexa as alternative antianginal - mild chest pains this AM, follow with restarting toprol and initiating ranexa, ambulate patient. If does well possible discharge later today        For questions or updates, please contact [place] Please consult [email or web address] for contact info under        Signed, Carlyle Dolly, MD  12/20/2019, 9:18 AM

## 2019-12-20 NOTE — Discharge Summary (Signed)
Discharge Summary    Patient ID: Ryan Vaughn MRN: [id number]; DOB: September 17, 1950  Admit date: 12/18/2019 Discharge date: 12/20/2019  Primary Care Provider: Curlene Labrum, MD  Primary Cardiologist: Previously Kate Sable, now Dr. Carlyle Dolly Primary Electrophysiologist:  None   Discharge Diagnoses    Principal Problem:   NSTEMI (non-ST elevated myocardial infarction) [facility]) Active Problems:   HTN (hypertension)   Hyperlipidemia   CAD (coronary artery disease)   Diabetes mellitus type II, non insulin dependent [facility])  Diagnostic Studies/Procedures    LHC 12/19/19  Prox LAD lesion is 100% stenosed.  Previously placed 1st Mrg stent (unknown type) is widely patent.  Prox Cx to Dist Cx lesion is 100% stenosed.  Previously placed Prox RCA to Mid RCA stent (unknown type) is widely patent.  LIMA graft was visualized by angiography and is normal in caliber.  The graft exhibits no disease.  The left ventricular systolic function is normal.  LV end diastolic pressure is mildly elevated.  The left ventricular ejection fraction is 55-65% by visual estimate.   1. 2 vessel occlusive CAD    - 100% proximal LAD    - 100% mid LCx. This vessel had multiple prior stents 2. Continued patency of stents in OM1 and the RCA 3. Patent LIMA to the LAD 4. Normal LV function 5. Mildly elevated LVEDP  Plan: NSTEMI is due to occlusion of the mid LCx. This is not salvageable. Continue medical therapy.   2D Echo 12/19/19 1. Left ventricular ejection fraction, by estimation, is 50 to 55%. The left ventricle has low normal function. The left ventricle has no regional wall motion abnormalities. Left ventricular diastolic parameters are consistent with Grade II diastolic dysfunction (pseudonormalization).  2. Right ventricular systolic function is normal. The right ventricular size is normal. There is normal pulmonary artery systolic pressure. The estimated right ventricular systolic  pressure is 54.0 mmHg.  3. Left atrial size was mildly dilated.  4. The mitral valve is normal in structure. No evidence of mitral valve regurgitation. No evidence of mitral stenosis.  5. The aortic valve is normal in structure. Aortic valve regurgitation is not visualized. No aortic stenosis is present.  6. The inferior vena cava is normal in size with greater than 50% respiratory variability, suggesting right atrial pressure of 3 mmHg.    _____________   History of Present Illness     Ryan Vaughn is a 69 y.o. male with CAD s/p single-vessel CABG (LIMA-LAD), multiple PCIs in the RCA and LCx territories, DM, HTN, HLD, non-Hodgkin's lymphoma who presented to [facility] with chest pain and elevated troponin. The discomfort started around noon the morning of admission, described as a pressure in the middle and left side of chest without associated symptoms. It lasted several minutes and he rates it as a 7/10, but did improve w/ NTG. It did not resolve completely after the NTG. He tried [employer] as well w/o full resolution. Sx were similar to prior angina, prompting him to seek care in the ED where initial troponin was found to be 744.  Hospital Course     1. CAD with history of CABG/PCIs, here with NSTEMI  - was treated with IV heparin with plan for LHC given troponin trend to 3433 - cath this admit LM normal, prox LAD occluded, LCX occluded, RCA patent. Patent LIMA-LAD graft. NSTEMI due to occlusion of LCX, not salvageable and recommended for medical management - echo LVEF 50-55%, grade II DDx - medical therapy now includes with ASA 81mg ,  Crestor 40mg , Plavix 75mg  (changed from low dose Xarelto), Toprol 50mg  (home dose), and addition of Ranexa - unfortunately he had recurrent headaches on even low dose Imdur so this was discontinued, also with soft bp's at times so started on Ranexa as alternative antianginal - he ambulated today without recurrent angina  2. Essential hypertension - managed in  context above  3. Hyperlipidemia - on Crestor with lipid profile performed revealing total cholesterol 104, LDL 14, HDL 25, trigs 326 - further management of lipids as OP  4. Diabetes mellitus - A1C 6.8, on metformin at home - advised to hold metformin at least 48 hours post-cath, recommended to restart 12/22/19  The patient feels well and feels ready to go home. Dr. Harl Bowie has seen and examined the patient today and feels he is stable for discharge. I have sent a message to our office's scheduling team requesting a TOC follow-up appointment, and our office Ryan call the patient with this information. Regarding OP cardiac rehab, I spoke with patient regarding this and he wishes to defer and think about it, and he Ryan plan to discuss at follow-up if he elects to do so.   Did the patient have an acute coronary syndrome (MI, NSTEMI, STEMI, etc) this admission?:  Yes                               AHA/ACC Clinical Performance & Quality Measures: 1. Aspirin prescribed? - Yes 2. ADP Receptor Inhibitor (Plavix/Clopidogrel, Brilinta/Ticagrelor or Effient/Prasugrel) prescribed (includes medically managed patients)? - Yes 3. Beta Blocker prescribed? - Yes 4. High Intensity Statin (Lipitor 40-80mg  or Crestor 20-40mg ) prescribed? - Yes 5. EF assessed during THIS hospitalization? - Yes 6. For EF <40%, was ACEI/ARB prescribed? - Not Applicable (EF >/= 78%) 7. For EF <40%, Aldosterone Antagonist (Spironolactone or Eplerenone) prescribed? - Not Applicable (EF >/= 67%) 8. Cardiac Rehab Phase II ordered (including medically managed patients)? - No - spoke with patient regarding this and he wishes to defer and think about it, and Ryan plan to discuss at follow-up if he elects to do so   _____________  Discharge Vitals Blood pressure (!) 133/69, pulse 77, temperature 97.7 F (36.5 C), temperature source Oral, resp. rate 19, height 5\' 11"  (1.803 m), weight (!) 98.6 kg, SpO2 96 %.  Filed Weights   12/18/19  1948 12/20/19 0000  Weight: (!) 110 kg (!) 98.6 kg    Labs & Radiologic Studies    CBC Recent Labs    12/19/19 0542 12/20/19 0342  WBC 6.5 7.0  HGB 13.0 12.3*  HCT 40.1 37.5*  MCV 92.8 91.0  PLT 174 672   Basic Metabolic Panel Recent Labs    12/18/19 1958 12/19/19 0542  NA 138 138  K 3.8 3.6  CL 102 102  CO2 27 28  GLUCOSE 168* 149*  BUN 8 7*  CREATININE 1.07 1.01  CALCIUM 9.3 9.0   High Sensitivity Troponin:   Recent Labs  Lab 12/18/19 1958 12/18/19 2231 12/19/19 0542  TROPONINIHS 744* 1,151* 3,433*    Hemoglobin A1C Recent Labs    12/18/19 2233  HGBA1C 6.8*   Fasting Lipid Panel Recent Labs    12/19/19 0542  CHOL 104  HDL 25*  LDLCALC 14  TRIG 326*  CHOLHDL 4.2   Thyroid Function Tests Recent Labs    12/18/19 2233  TSH 1.589   _____________  DG Chest 2 View  Result Date: 12/18/2019 CLINICAL DATA:  Chest pain. EXAM: CHEST - 2 VIEW COMPARISON:  December 15, 2018. FINDINGS: The heart size and mediastinal contours are within normal limits. Both lungs are clear. No pneumothorax or pleural effusion is noted. Sternotomy wires are noted. The visualized skeletal structures are unremarkable. IMPRESSION: No active cardiopulmonary disease. Electronically Signed   By: Marijo Conception M.D.   On: 12/18/2019 20:10   CARDIAC CATHETERIZATION  Result Date: 12/19/2019  Prox LAD lesion is 100% stenosed.  Previously placed 1st Mrg stent (unknown type) is widely patent.  Prox Cx to Dist Cx lesion is 100% stenosed.  Previously placed Prox RCA to Mid RCA stent (unknown type) is widely patent.  LIMA graft was visualized by angiography and is normal in caliber.  The graft exhibits no disease.  The left ventricular systolic function is normal.  LV end diastolic pressure is mildly elevated.  The left ventricular ejection fraction is 55-65% by visual estimate.  1. 2 vessel occlusive CAD    - 100% proximal LAD    - 100% mid LCx. This vessel had multiple prior stents 2.  Continued patency of stents in OM1 and the RCA 3. Patent LIMA to the LAD 4. Normal LV function 5. Mildly elevated LVEDP Plan: NSTEMI is due to occlusion of the mid LCx. This is not salvageable. Continue medical therapy.   ECHOCARDIOGRAM COMPLETE  Result Date: 12/19/2019    ECHOCARDIOGRAM REPORT   Patient Name:   Ryan Vaughn Date of Exam: 12/19/2019 Medical Rec #:  [id number]      Height:       71.0 in Accession #:    [id number]     Weight:       242.5 lb Date of Birth:  1950-10-29      BSA:          2.289 m Patient Age:    69 years       BP:           96/64 mmHg Patient Gender: M              HR:           64 bpm. Exam Location:  Inpatient Procedure: 2D Echo and Intracardiac Opacification Agent Indications:    Acute Cornonary Syndrome I24.9  History:        Patient has prior history of Echocardiogram examinations, most                 recent 12/16/2018. CAD and Previous Myocardial Infarction, Prior                 CABG; Risk Factors:Hypertension, Diabetes and Dyslipidemia. Past                 history of Non Hodgkin's lymphoma.  Sonographer:    Darlina Sicilian RDCS Referring Phys: [id number] [place]  1. Left ventricular ejection fraction, by estimation, is 50 to 55%. The left ventricle has low normal function. The left ventricle has no regional wall motion abnormalities. Left ventricular diastolic parameters are consistent with Grade II diastolic dysfunction (pseudonormalization).  2. Right ventricular systolic function is normal. The right ventricular size is normal. There is normal pulmonary artery systolic pressure. The estimated right ventricular systolic pressure is 32.3 mmHg.  3. Left atrial size was mildly dilated.  4. The mitral valve is normal in structure. No evidence of mitral valve regurgitation. No evidence of mitral stenosis.  5. The aortic valve is normal in structure. Aortic valve regurgitation is not visualized.  No aortic stenosis is present.  6. The inferior vena cava is  normal in size with greater than 50% respiratory variability, suggesting right atrial pressure of 3 mmHg. FINDINGS  Left Ventricle: Left ventricular ejection fraction, by estimation, is 50 to 55%. The left ventricle has low normal function. The left ventricle has no regional wall motion abnormalities. Definity contrast agent was given IV to delineate the left ventricular endocardial borders. The left ventricular internal cavity size was normal in size. There is no left ventricular hypertrophy. Left ventricular diastolic parameters are consistent with Grade II diastolic dysfunction (pseudonormalization). Normal left ventricular filling pressure. Right Ventricle: The right ventricular size is normal. No increase in right ventricular wall thickness. Right ventricular systolic function is normal. There is normal pulmonary artery systolic pressure. The tricuspid regurgitant velocity is 2.12 m/s, and  with an assumed right atrial pressure of 3 mmHg, the estimated right ventricular systolic pressure is 82.4 mmHg. Left Atrium: Left atrial size was mildly dilated. Right Atrium: Right atrial size was normal in size. Pericardium: Trivial pericardial effusion is present. The pericardial effusion is localized near the right atrium. Mitral Valve: The mitral valve is normal in structure. Normal mobility of the mitral valve leaflets. No evidence of mitral valve regurgitation. No evidence of mitral valve stenosis. Tricuspid Valve: The tricuspid valve is normal in structure. Tricuspid valve regurgitation is mild . No evidence of tricuspid stenosis. Aortic Valve: The aortic valve is normal in structure. Aortic valve regurgitation is not visualized. No aortic stenosis is present. Pulmonic Valve: The pulmonic valve was normal in structure. Pulmonic valve regurgitation is trivial. No evidence of pulmonic stenosis. Aorta: The aortic root is normal in size and structure. Venous: The inferior vena cava is normal in size with greater than  50% respiratory variability, suggesting right atrial pressure of 3 mmHg. IAS/Shunts: No atrial level shunt detected by color flow Doppler.  LEFT VENTRICLE PLAX 2D LVIDd:         5.30 cm      Diastology LVIDs:         4.00 cm      LV e' lateral:   8.70 cm/s LV PW:         1.00 cm      LV E/e' lateral: 8.9 LV IVS:        0.80 cm      LV e' medial:    6.42 cm/s LVOT diam:     1.90 cm      LV E/e' medial:  12.0 LV SV:         55 LV SV Index:   24 LVOT Area:     2.84 cm  LV Volumes (MOD) LV vol d, MOD A2C: 139.0 ml LV vol d, MOD A4C: 119.0 ml LV vol s, MOD A2C: 57.6 ml LV vol s, MOD A4C: 62.4 ml LV SV MOD A2C:     81.4 ml LV SV MOD A4C:     119.0 ml LV SV MOD BP:      69.6 ml RIGHT VENTRICLE RV S prime:     7.29 cm/s TAPSE (M-mode): 1.7 cm LEFT ATRIUM             Index LA Vol (A2C):   76.8 ml 33.56 ml/m LA Vol (A4C):   89.8 ml 39.24 ml/m LA Biplane Vol: 88.2 ml 38.54 ml/m  AORTIC VALVE LVOT Vmax:   91.40 cm/s LVOT Vmean:  61.500 cm/s LVOT VTI:    0.193 m  AORTA Ao Root diam: 3.30  cm MITRAL VALVE               TRICUSPID VALVE MV Area (PHT): 3.27 cm    TR Peak grad:   18.0 mmHg MV Decel Time: 232 msec    TR Vmax:        212.00 cm/s MV E velocity: 77.10 cm/s MV A velocity: 64.30 cm/s  SHUNTS MV E/A ratio:  1.20        Systemic VTI:  0.19 m                            Systemic Diam: 1.90 cm Fransico Him MD Electronically signed by Fransico Him MD Signature Date/Time: 12/19/2019/8:31:06 AM    Final    Disposition   Pt is being discharged home today in good condition.  Follow-up Plans & Appointments     Follow-up Information    Branch, Alphonse Guild, MD Follow up.   Specialty: Cardiology Why: Our office Ryan call you for a follow-up appointment. Please call the office if you have not heard from [place] within 3 days. Contact information: [place] [address] [phone]              Discharge Instructions    Diet - low sodium heart healthy   Complete by: As directed    Discharge  instructions   Complete by: As directed    IMPORTANT!!! You need to avoid Metformin for at least 48 hours after your heart cath. You can restart this the morning of 12/22/19.  You Ryan go home on two blood thinners, aspirin and clopidogrel. Your Xarelto (rivaroxaban) was stopped. If you notice any bleeding such as blood in stool, black tarry stools, blood in urine, nosebleeds or any other unusual bleeding, call your doctor immediately. It is not normal to have this kind of bleeding while on a blood thinner and usually indicates there is an underlying problem with one of your body systems that needs to be checked out.  You were also started on a new medicine called Ranexa/ranolazine for symptoms of chest pain.   Increase activity slowly   Complete by: As directed    No driving for 1 week. No lifting over 10 lbs for 2 weeks. No sexual activity for 2 weeks. Keep procedure site clean & dry. If you notice increased pain, swelling, bleeding or pus, call/return!  You may shower, but no soaking baths/hot tubs/pools for 1 week.      Discharge Medications   Allergies as of 12/20/2019      Reactions   Imdur [isosorbide Nitrate] Diarrhea, Other (See Comments)   Headache, diarrhea   Ivp Dye [iodinated Diagnostic Agents] Other (See Comments)   headache      Medication List    STOP taking these medications   Xarelto 2.5 MG Tabs tablet Generic drug: rivaroxaban     TAKE these medications   aspirin 81 MG EC tablet Take 1 tablet (81 mg total) by mouth daily. Swallow whole. Start taking on: December 21, 2019   clopidogrel 75 MG tablet Commonly known as: PLAVIX Take 1 tablet (75 mg total) by mouth daily. Start taking on: December 21, 2019   metFORMIN 500 MG tablet Commonly known as: GLUCOPHAGE Take 500 mg by mouth 2 (two) times daily with a meal. Notes to patient: IMPORTANT! You need to avoid Metformin for at least 48 hours after your heart cath. You can restart this the morning of  12/22/19.      metoprolol succinate 50 MG 24 hr tablet Commonly known as: TOPROL-XL Take 50 mg by mouth daily.   nitroGLYCERIN 0.4 MG SL tablet Commonly known as: NITROSTAT Place 1 tablet (0.4 mg total) under the tongue every 5 (five) minutes x 3 doses as needed for chest pain.   ranolazine 500 MG 12 hr tablet Commonly known as: RANEXA Take 1 tablet (500 mg total) by mouth 2 (two) times daily.   rosuvastatin 40 MG tablet Commonly known as: CRESTOR TAKE 1 TABLET(40 MG) BY MOUTH DAILY What changed:   how much to take  how to take this  when to take this  additional instructions       Confirmed by phone that pt's pharmacy is open until 6pm.    Outstanding Labs/Studies   N/A  Duration of Discharge Encounter   Greater than 30 minutes including physician time.  Signed, Charlie Pitter, PA-C 12/20/2019, 1:51 PM

## 2019-12-20 NOTE — Plan of Care (Signed)

## 2019-12-20 NOTE — Plan of Care (Signed)
  Problem: Cardiovascular: Goal: Vascular access site(s) Level 0-1 will be maintained Outcome: Progressing   

## 2019-12-22 ENCOUNTER — Encounter (HOSPITAL_COMMUNITY): Payer: Self-pay | Admitting: Cardiology

## 2019-12-22 MED FILL — Nitroglycerin IV Soln 100 MCG/ML in D5W: INTRA_ARTERIAL | Qty: 10 | Status: AC

## 2019-12-23 ENCOUNTER — Telehealth: Payer: Self-pay

## 2019-12-23 ENCOUNTER — Telehealth: Payer: Self-pay | Admitting: Cardiology

## 2019-12-23 NOTE — Telephone Encounter (Signed)
Pt called back and clarified that he is only taking the Rosuvastatin 40mg  daily. The pharmacy keeps trying to fill an old Rx of 20mg  but he hasn't got it filled.

## 2019-12-23 NOTE — Telephone Encounter (Signed)
Patient contacted regarding discharge from [facility] on 12/20/2019.    Patient understands to follow up with Dr. Harl Bowie on Monday, 12/29/2019 at 11:40 in [place] office.   Patient understands discharge instructions?  Yes  Patient understands medications and regiment?  Yes  Patient understands to bring all medications to this visit?  Yes

## 2019-12-23 NOTE — Telephone Encounter (Signed)
TOC- discharged from [facility] 12/20/2019. Apppointment made for 12/29/2019 with Dr. Harl Bowie [facility]. Patient needs 7-10 day fu with EKG per notes from [facility].

## 2019-12-23 NOTE — Telephone Encounter (Signed)
Follow Up  Patient returning call. Transferred call to April.

## 2019-12-23 NOTE — Telephone Encounter (Signed)
Received documentation from [employer] that this pt is receiving Rosuvastatin from multiple providers. I LM for him to call me back to clarify which dose he is taking and to make sure he isn't taking more than necessary.

## 2019-12-29 ENCOUNTER — Ambulatory Visit (INDEPENDENT_AMBULATORY_CARE_PROVIDER_SITE_OTHER): Payer: Medicare Other | Admitting: Cardiology

## 2019-12-29 ENCOUNTER — Encounter: Payer: Self-pay | Admitting: Cardiology

## 2019-12-29 VITALS — BP 128/60 | HR 67 | Ht 71.0 in | Wt 218.0 lb

## 2019-12-29 DIAGNOSIS — I25810 Atherosclerosis of coronary artery bypass graft(s) without angina pectoris: Secondary | ICD-10-CM | POA: Diagnosis not present

## 2019-12-29 DIAGNOSIS — E782 Mixed hyperlipidemia: Secondary | ICD-10-CM

## 2019-12-29 DIAGNOSIS — H40013 Open angle with borderline findings, low risk, bilateral: Secondary | ICD-10-CM | POA: Diagnosis not present

## 2019-12-29 NOTE — Progress Notes (Signed)
Clinical Summary [name] is a 69 y.o.male seen today for follow up of the following medical problems.   1. CAD - h/o CAD s/p single-vessel CABG (LIMA-LAD), multiple PCI in the RCA and LCx territories,  - 11/2018 cath revealed patent LIMA to the LAD and patent stents in the RCA and circumflex.  - admission 11/2019 with NSTEMI.  - 11/2019 cath LM normal, prox LAD occluded, LCX occluded, RCA patent. Patent LIMA-LAD graft. NSTEMI due to occlusion of LCX, not salvageable and recs for medical management - echo LVEF 50-55%, grade II DDx   - headaches on imdur - started on ranexa as inpatient - compliant with meds - slight chest pains at times though not bothersome.   2. Hyperilpidemia - 11/2019 TC 104 TG 326 HDL 25 LDL 14    Past Medical History:  Diagnosis Date  . Arthritis   . CAD (coronary artery disease)    a. DES to OM 1 and DES mLCX (12/05/13) b. CABG x1  ('92) c. 6 stents placed in 2009? (pt hx)   . Diabetes mellitus without complication (Ryan Vaughn)    type 2  . Essential hypertension, benign   . Heart murmur    as a child   . History of kidney stones   . Lumbago   . Mixed hyperlipidemia   . Myocardial infarction [facility]) age 58  . Non Hodgkin's lymphoma ([place]) 2002   a. stage IV, chemo done  . Personal history of colonic polyps      Allergies  Allergen Reactions  . Imdur [Isosorbide Nitrate] Diarrhea and Other (See Comments)    Headache, diarrhea  . Ivp Dye [Iodinated Diagnostic Agents] Other (See Comments)    headache     Current Outpatient Medications  Medication Sig Dispense Refill  . aspirin EC 81 MG EC tablet Take 1 tablet (81 mg total) by mouth daily. Swallow whole. 30 tablet 11  . clopidogrel (PLAVIX) 75 MG tablet Take 1 tablet (75 mg total) by mouth daily. 30 tablet 6  . metFORMIN (GLUCOPHAGE) 500 MG tablet Take 500 mg by mouth 2 (two) times daily with a meal.    . metoprolol succinate (TOPROL-XL) 50 MG 24 hr tablet Take 50 mg by mouth daily.    .  nitroGLYCERIN (NITROSTAT) 0.4 MG SL tablet Place 1 tablet (0.4 mg total) under the tongue every 5 (five) minutes x 3 doses as needed for chest pain. 25 tablet 3  . ranolazine (RANEXA) 500 MG 12 hr tablet Take 1 tablet (500 mg total) by mouth 2 (two) times daily. 60 tablet 6  . rosuvastatin (CRESTOR) 40 MG tablet TAKE 1 TABLET(40 MG) BY MOUTH DAILY (Patient taking differently: Take 40 mg by mouth daily. ) 90 tablet 1   No current facility-administered medications for this visit.     Past Surgical History:  Procedure Laterality Date  . CARDIAC CATHETERIZATION N/A 07/16/2015   Procedure: Left Heart Cath and Cors/Grafts Angiography;  Surgeon: Jettie Booze, MD;  Location: [place] [facility];  Service: Cardiovascular;  Laterality: N/A;  . CARDIAC CATHETERIZATION N/A 07/16/2015   Procedure: Coronary Stent Intervention;  Surgeon: Jettie Booze, MD;  Location: [place] [facility];  Service: Cardiovascular;  Laterality: N/A;  . CHOLECYSTECTOMY  08/2000  . CORONARY ANGIOPLASTY WITH STENT PLACEMENT  ~ 1999; ~ 2009 X 2; 12/05/2013   "1 +3 + 2 + 2" ( total of 8 or 9 stents)  . CORONARY ARTERY BYPASS GRAFT  1992   "CABG  X1"  . CYSTOSCOPY W/ URETERAL STENT PLACEMENT Right 09/12/2019   Procedure: CYSTOSCOPY WITH RETROGRADE PYELOGRAM/URETERAL STENT PLACEMENT;  Surgeon: Ceasar Mons, MD;  Location: [facility];  Service: Urology;  Laterality: Right;  . CYSTOSCOPY/URETEROSCOPY/HOLMIUM LASER/STENT PLACEMENT Right 10/01/2019   Procedure: CYSTOSCOPY/URETEROSCOPY/HOLMIUM LASER/STENT PLACEMENT;  Surgeon: Ceasar Mons, MD;  [facility];  Service: Urology;  Laterality: Right;  . EXPLORATORY LAPAROTOMY WITH ABDOMINAL MASS EXCISION  08/2000   "looking for cancer"  . INGUINAL HERNIA REPAIR    . LEFT HEART CATH AND CORS/GRAFTS ANGIOGRAPHY N/A 12/16/2018   Procedure: LEFT HEART CATH AND CORS/GRAFTS ANGIOGRAPHY;  Surgeon: Sherren Mocha, MD;  Location: [place] [facility];   Service: Cardiovascular;  Laterality: N/A;  . LEFT HEART CATH AND CORS/GRAFTS ANGIOGRAPHY N/A 12/19/2019   Procedure: LEFT HEART CATH AND CORS/GRAFTS ANGIOGRAPHY;  Surgeon: Martinique, Peter M, MD;  Location: [place] [facility];  Service: Cardiovascular;  Laterality: N/A;  . LEFT HEART CATHETERIZATION WITH CORONARY ANGIOGRAM N/A 12/05/2013   Procedure: LEFT HEART CATHETERIZATION WITH CORONARY ANGIOGRAM;  Surgeon: Blane Ohara, MD;  Location: [facility] C[facility];  Service: Cardiovascular;  Laterality: N/A;  . PERCUTANEOUS CORONARY STENT INTERVENTION (PCI-S)  12/05/2013   Procedure: PERCUTANEOUS CORONARY STENT INTERVENTION (PCI-S);  Surgeon: Blane Ohara, MD;  Location: [facility] CATH LAB;  Service: Cardiovascular;;  mid circumflex and prox OM2  . TONSILLECTOMY       Allergies  Allergen Reactions  . Imdur [Isosorbide Nitrate] Diarrhea and Other (See Comments)    Headache, diarrhea  . Ivp Dye [Iodinated Diagnostic Agents] Other (See Comments)    headache      Family History  Problem Relation Age of Onset  . Breast cancer Mother   . Colon cancer Father      Social History [name] reports that he has never smoked. His smokeless tobacco use includes snuff. [name] reports previous alcohol use.   Review of Systems CONSTITUTIONAL: No weight loss, fever, chills, weakness or fatigue.  HEENT: Eyes: No visual loss, blurred vision, double vision or yellow sclerae.No hearing loss, sneezing, congestion, runny nose or sore throat.  SKIN: No rash or itching.  CARDIOVASCULAR: per hpi RESPIRATORY: No shortness of breath, cough or sputum.  GASTROINTESTINAL: No anorexia, nausea, vomiting or diarrhea. No abdominal pain or blood.  GENITOURINARY: No burning on urination, no polyuria NEUROLOGICAL: No headache, dizziness, syncope, paralysis, ataxia, numbness or tingling in the extremities. No change in bowel or bladder control.  MUSCULOSKELETAL: No muscle, back pain, joint pain or stiffness.  LYMPHATICS:  No enlarged nodes. No history of splenectomy.  PSYCHIATRIC: No history of depression or anxiety.  ENDOCRINOLOGIC: No reports of sweating, cold or heat intolerance. No polyuria or polydipsia.  Marland Kitchen   Physical Examination Today's Vitals   12/29/19 1113  BP: 128/60  Pulse: 67  SpO2: 97%  Weight: 218 lb (98.9 kg)  Height: 5\' 11"  (1.803 m)   Body mass index is 30.4 kg/m.  Gen: resting comfortably, no acute distress HEENT: no scleral icterus, pupils equal round and reactive, no palptable cervical adenopathy,  CV: RRR, no m/r/g, no jvd Resp: Clear to auscultation bilaterally GI: abdomen is soft, non-tender, non-distended, normal bowel sounds, no hepatosplenomegaly MSK: extremities are warm, no edema.  Skin: warm, no rash Neuro:  no focal deficits Psych: appropriate affect    Assessment and Plan  1. CAD - recent NSTEMI as reported above, medically managed as LCX was not amenable to PCI - no signicicant symptoms, continue current meds. If  recurrent angina would increase ranexa. Did not tolerate imdur due to headaches - soft bp's in hospital, has not been on ACE/ARB. May consider at f/u - on DAPT for medically managed NSTEMI - refer to cardiac rehab  2. Hyperlipidemia -at goal, continue statin    Arnoldo Lenis, M.D.

## 2019-12-29 NOTE — Patient Instructions (Signed)
Your physician recommends that you schedule a follow-up appointment in: [place]  Your physician recommends that you continue on your current medications as directed. Please refer to the Current Medication list given to you today.  You have been referred to CARDIAC REHAB  Thank you for choosing [facility]!!

## 2020-03-02 DIAGNOSIS — I10 Essential (primary) hypertension: Secondary | ICD-10-CM | POA: Diagnosis not present

## 2020-03-02 DIAGNOSIS — R5383 Other fatigue: Secondary | ICD-10-CM | POA: Diagnosis not present

## 2020-03-02 DIAGNOSIS — Z8572 Personal history of non-Hodgkin lymphomas: Secondary | ICD-10-CM | POA: Diagnosis not present

## 2020-03-02 DIAGNOSIS — E782 Mixed hyperlipidemia: Secondary | ICD-10-CM | POA: Diagnosis not present

## 2020-03-02 DIAGNOSIS — E7800 Pure hypercholesterolemia, unspecified: Secondary | ICD-10-CM | POA: Diagnosis not present

## 2020-03-02 DIAGNOSIS — E1169 Type 2 diabetes mellitus with other specified complication: Secondary | ICD-10-CM | POA: Diagnosis not present

## 2020-03-04 DIAGNOSIS — I10 Essential (primary) hypertension: Secondary | ICD-10-CM | POA: Diagnosis not present

## 2020-03-04 DIAGNOSIS — D126 Benign neoplasm of colon, unspecified: Secondary | ICD-10-CM | POA: Diagnosis not present

## 2020-03-04 DIAGNOSIS — Z23 Encounter for immunization: Secondary | ICD-10-CM | POA: Diagnosis not present

## 2020-03-04 DIAGNOSIS — E1169 Type 2 diabetes mellitus with other specified complication: Secondary | ICD-10-CM | POA: Diagnosis not present

## 2020-03-04 DIAGNOSIS — I214 Non-ST elevation (NSTEMI) myocardial infarction: Secondary | ICD-10-CM | POA: Diagnosis not present

## 2020-03-04 DIAGNOSIS — Z6831 Body mass index (BMI) 31.0-31.9, adult: Secondary | ICD-10-CM | POA: Diagnosis not present

## 2020-03-04 DIAGNOSIS — Z955 Presence of coronary angioplasty implant and graft: Secondary | ICD-10-CM | POA: Diagnosis not present

## 2020-03-04 DIAGNOSIS — E782 Mixed hyperlipidemia: Secondary | ICD-10-CM | POA: Diagnosis not present

## 2020-03-17 DIAGNOSIS — Z23 Encounter for immunization: Secondary | ICD-10-CM | POA: Diagnosis not present

## 2020-04-01 ENCOUNTER — Ambulatory Visit (INDEPENDENT_AMBULATORY_CARE_PROVIDER_SITE_OTHER): Payer: Medicare Other | Admitting: Cardiology

## 2020-04-01 ENCOUNTER — Encounter: Payer: Self-pay | Admitting: Cardiology

## 2020-04-01 VITALS — BP 100/68 | HR 65 | Ht 70.0 in | Wt 220.0 lb

## 2020-04-01 DIAGNOSIS — E782 Mixed hyperlipidemia: Secondary | ICD-10-CM | POA: Diagnosis not present

## 2020-04-01 DIAGNOSIS — I2510 Atherosclerotic heart disease of native coronary artery without angina pectoris: Secondary | ICD-10-CM | POA: Diagnosis not present

## 2020-04-01 DIAGNOSIS — I25810 Atherosclerosis of coronary artery bypass graft(s) without angina pectoris: Secondary | ICD-10-CM | POA: Diagnosis not present

## 2020-04-01 NOTE — Patient Instructions (Signed)

## 2020-04-01 NOTE — Progress Notes (Signed)
Clinical Summary Ryan Vaughn is a 68 y.o.male seen today for follow up of the following medical problems.   1. CAD -h/o CAD s/p single-vessel CABG (LIMA-LAD), multiple PCI in the RCA and LCx territories, - 11/2018 cath revealed patent LIMA to the LAD and patent stents in the RCA and circumflex.  - admission 11/2019 with NSTEMI.  - 11/2019 cath LM normal, prox LAD occluded, LCX occluded, RCA patent. Patent LIMA-LAD graft. NSTEMI due to occlusion of LCX, not salvageable and recs for medical management - echo LVEF 50-55%, grade II DDx   - headaches on imdur - started on ranexa as inpatient - slight chest pains at times, overall not bothersome.  - compliant with meds - starting cardiac rehab later this month   2. Hyperilpidemia  02/2020 TC 114 TG 172 HDL 30 LDL 55 - compliant with statin  Has had covid pfizer vaccine x 3 Past Medical History:  Diagnosis Date  . Arthritis   . CAD (coronary artery disease)    a. DES to OM 1 and DES mLCX (12/05/13) b. CABG x1  ('92) c. 6 stents placed in 2009? (pt hx)   . Diabetes mellitus without complication ([place])    type 2  . Essential hypertension, benign   . Heart murmur    as a child   . History of kidney stones   . Lumbago   . Mixed hyperlipidemia   . Myocardial infarction [facility]) age 19  . Non Hodgkin's lymphoma ([place]) 2002   a. stage IV, chemo done  . Personal history of colonic polyps      Allergies  Allergen Reactions  . Imdur [Isosorbide Nitrate] Diarrhea and Other (See Comments)    Headache, diarrhea  . Ivp Dye [Iodinated Diagnostic Agents] Other (See Comments)    headache     Current Outpatient Medications  Medication Sig Dispense Refill  . aspirin EC 81 MG EC tablet Take 1 tablet (81 mg total) by mouth daily. Swallow whole. 30 tablet 11  . clopidogrel (PLAVIX) 75 MG tablet Take 1 tablet (75 mg total) by mouth daily. 30 tablet 6  . metFORMIN (GLUCOPHAGE) 500 MG tablet Take 500 mg by mouth 2 (two) times daily with  a meal.    . metoprolol succinate (TOPROL-XL) 50 MG 24 hr tablet Take 50 mg by mouth daily.    . nitroGLYCERIN (NITROSTAT) 0.4 MG SL tablet Place 1 tablet (0.4 mg total) under the tongue every 5 (five) minutes x 3 doses as needed for chest pain. 25 tablet 3  . ranolazine (RANEXA) 500 MG 12 hr tablet Take 1 tablet (500 mg total) by mouth 2 (two) times daily. 60 tablet 6  . rosuvastatin (CRESTOR) 40 MG tablet TAKE 1 TABLET(40 MG) BY MOUTH DAILY 90 tablet 1   No current facility-administered medications for this visit.     Past Surgical History:  Procedure Laterality Date  . CARDIAC CATHETERIZATION N/A 07/16/2015   Procedure: Left Heart Cath and Cors/Grafts Angiography;  Surgeon: Jettie Booze, MD;  Location: [place] [facility];  Service: Cardiovascular;  Laterality: N/A;  . CARDIAC CATHETERIZATION N/A 07/16/2015   Procedure: Coronary Stent Intervention;  Surgeon: Jettie Booze, MD;  Location: [place] [facility];  Service: Cardiovascular;  Laterality: N/A;  . CHOLECYSTECTOMY  08/2000  . CORONARY ANGIOPLASTY WITH STENT PLACEMENT  ~ 1999; ~ 2009 X 2; 12/05/2013   "1 +3 + 2 + 2" ( total of 8 or 9 stents)  . CORONARY ARTERY BYPASS GRAFT  1992   "CABG X1"  . CYSTOSCOPY W/ URETERAL STENT PLACEMENT Right 09/12/2019   Procedure: CYSTOSCOPY WITH RETROGRADE PYELOGRAM/URETERAL STENT PLACEMENT;  Surgeon: Ceasar Mons, MD;  Location: [facility];  Service: Urology;  Laterality: Right;  . CYSTOSCOPY/URETEROSCOPY/HOLMIUM LASER/STENT PLACEMENT Right 10/01/2019   Procedure: CYSTOSCOPY/URETEROSCOPY/HOLMIUM LASER/STENT PLACEMENT;  Surgeon: Ceasar Mons, MD;  Location: [facility];  Service: Urology;  Laterality: Right;  . EXPLORATORY LAPAROTOMY WITH ABDOMINAL MASS EXCISION  08/2000   "looking for cancer"  . INGUINAL HERNIA REPAIR    . LEFT HEART CATH AND CORS/GRAFTS ANGIOGRAPHY N/A 12/16/2018   Procedure: LEFT HEART CATH AND CORS/GRAFTS ANGIOGRAPHY;  Surgeon: Sherren Mocha, MD;  Location: [place] [facility];  Service: Cardiovascular;  Laterality: N/A;  . LEFT HEART CATH AND CORS/GRAFTS ANGIOGRAPHY N/A 12/19/2019   Procedure: LEFT HEART CATH AND CORS/GRAFTS ANGIOGRAPHY;  Surgeon: Martinique, Peter M, MD;  Location: [place] [facility];  Service: Cardiovascular;  Laterality: N/A;  . LEFT HEART CATHETERIZATION WITH CORONARY ANGIOGRAM N/A 12/05/2013   Procedure: LEFT HEART CATHETERIZATION WITH CORONARY ANGIOGRAM;  Surgeon: Blane Ohara, MD;  Location: [facility] CATH LAB;  Service: Cardiovascular;  Laterality: N/A;  . PERCUTANEOUS CORONARY STENT INTERVENTION (PCI-S)  12/05/2013   Procedure: PERCUTANEOUS CORONARY STENT INTERVENTION (PCI-S);  Surgeon: Blane Ohara, MD;  Location: [facility] CATH LAB;  Service: Cardiovascular;;  mid circumflex and prox OM2  . TONSILLECTOMY       Allergies  Allergen Reactions  . Imdur [Isosorbide Nitrate] Diarrhea and Other (See Comments)    Headache, diarrhea  . Ivp Dye [Iodinated Diagnostic Agents] Other (See Comments)    headache      Family History  Problem Relation Age of Onset  . Breast cancer Mother   . Colon cancer Father      Social History Ryan Vaughn reports that he has never smoked. His smokeless tobacco use includes snuff. Ryan Vaughn reports previous alcohol use.   Review of Systems CONSTITUTIONAL: No weight loss, fever, chills, weakness or fatigue.  HEENT: Eyes: No visual loss, blurred vision, double vision or yellow sclerae.No hearing loss, sneezing, congestion, runny nose or sore throat.  SKIN: No rash or itching.  CARDIOVASCULAR: per hpi RESPIRATORY: No shortness of breath, cough or sputum.  GASTROINTESTINAL: No anorexia, nausea, vomiting or diarrhea. No abdominal pain or blood.  GENITOURINARY: No burning on urination, no polyuria NEUROLOGICAL: No headache, dizziness, syncope, paralysis, ataxia, numbness or tingling in the extremities. No change in bowel or bladder control.  MUSCULOSKELETAL: No muscle, back  pain, joint pain or stiffness.  LYMPHATICS: No enlarged nodes. No history of splenectomy.  PSYCHIATRIC: No history of depression or anxiety.  ENDOCRINOLOGIC: No reports of sweating, cold or heat intolerance. No polyuria or polydipsia.  Marland Kitchen   Physical Examination Today's Vitals   04/01/20 1050  BP: 100/68  Pulse: 65  SpO2: 95%  Weight: 220 lb (99.8 kg)  Height: 5\' 10"  (1.778 m)   Body mass index is 31.57 kg/m.  Gen: resting comfortably, no acute distress HEENT: no scleral icterus, pupils equal round and reactive, no palptable cervical adenopathy,  CV: RRR, no m/r/g, no jvd Resp: Clear to auscultation bilaterally GI: abdomen is soft, non-tender, non-distended, normal bowel sounds, no hepatosplenomegaly MSK: extremities are warm, no edema.  Skin: warm, no rash Neuro:  no focal deficits Psych: appropriate affect    Assessment and Plan   1. CAD - no significant symptoms, continue current meds - medical therapy limited by soft bp's, not on ACE/ARB -  if recurrent chest pains would try increasing ranexa  2. Hyperlipidemia - LDL at goal, discussed dietary changes to improve TGs     Arnoldo Lenis, M.D.

## 2020-04-14 ENCOUNTER — Other Ambulatory Visit: Payer: Self-pay

## 2020-04-14 ENCOUNTER — Encounter (HOSPITAL_COMMUNITY): Payer: Self-pay

## 2020-04-14 ENCOUNTER — Encounter (HOSPITAL_COMMUNITY)
Admission: RE | Admit: 2020-04-14 | Discharge: 2020-04-14 | Disposition: A | Discharge status: Home / Self Care | Payer: Medicare Other | Source: Ambulatory Visit | Attending: Cardiology | Admitting: Cardiology

## 2020-04-14 VITALS — BP 118/72 | HR 69 | Ht 71.0 in | Wt 222.4 lb

## 2020-04-14 DIAGNOSIS — I214 Non-ST elevation (NSTEMI) myocardial infarction: Secondary | ICD-10-CM

## 2020-04-14 LAB — GLUCOSE, CAPILLARY
Glucose-Capillary: 79 mg/dL (ref 70–99)
Glucose-Capillary: 96 mg/dL (ref 70–99)

## 2020-04-14 NOTE — Progress Notes (Signed)
Cardiac/Pulmonary Rehab Medication Review by a Pharmacist  Does the patient  feel that his/her medications are working for him/her?  yes  Has the patient been experiencing any side effects to the medications prescribed?  no  Does the patient measure his/her own blood pressure or blood glucose at home?  yes   Does the patient have any problems obtaining medications due to transportation or finances?   no  Understanding of regimen: excellent Understanding of indications: good Potential of compliance: excellent  Questions asked to Determine Patient Understanding of Medication Regimen:  1. What is the name of the medication?  2. What is the medication used for?  3. When should it be taken?  4. How much should be taken?  5. How will you take it?  6. What side effects should you report?  Understanding Defined as: Excellent: All questions above are correct Good: Questions 1-4 are correct Fair: Questions 1-2 are correct  Poor: 1 or none of the above questions are correct   Pharmacist comments: Patient presents for cardiac rehab today. Reviewed his medications and he is tolerating current regimen. He is monitoring BP and BS periodically. No issues identified, continue current regimen. Thanks for the opportunity to participate in the care of this patient,  Isac Sarna, BS [place], [place] Pharmacist Pager [phone] 04/14/2020 1:04 PM

## 2020-04-14 NOTE — Progress Notes (Addendum)
Cardiac Individual Treatment Plan  Patient Details  Name: Ryan Vaughn MRN: [id number] Date of Birth: 04/11/1951 Referring Provider:    Initial Encounter Date:    CARDIAC REHAB PHASE II ORIENTATION from 04/14/2020 in [place]  Date 04/14/20      Visit Diagnosis: NSTEMI (non-ST elevated myocardial infarction) [facility])  Patient's Home Medications on Admission:  Current Outpatient Medications:  .  aspirin EC 81 MG EC tablet, Take 1 tablet (81 mg total) by mouth daily. Swallow whole., Disp: 30 tablet, Rfl: 11 .  clopidogrel (PLAVIX) 75 MG tablet, Take 1 tablet (75 mg total) by mouth daily., Disp: 30 tablet, Rfl: 6 .  metFORMIN (GLUCOPHAGE) 500 MG tablet, Take 500 mg by mouth 2 (two) times daily with a meal., Disp: , Rfl:  .  metoprolol succinate (TOPROL-XL) 50 MG 24 hr tablet, Take 50 mg by mouth daily., Disp: , Rfl:  .  nitroGLYCERIN (NITROSTAT) 0.4 MG SL tablet, Place 1 tablet (0.4 mg total) under the tongue every 5 (five) minutes x 3 doses as needed for chest pain., Disp: 25 tablet, Rfl: 3 .  ranolazine (RANEXA) 500 MG 12 hr tablet, Take 1 tablet (500 mg total) by mouth 2 (two) times daily., Disp: 60 tablet, Rfl: 6 .  rosuvastatin (CRESTOR) 40 MG tablet, TAKE 1 TABLET(40 MG) BY MOUTH DAILY, Disp: 90 tablet, Rfl: 1  Past Medical History: Past Medical History:  Diagnosis Date  . Arthritis   . CAD (coronary artery disease)    a. DES to OM 1 and DES mLCX (12/05/13) b. CABG x1  ('92) c. 6 stents placed in 2009? (pt hx)   . Diabetes mellitus without complication ([place])    type 2  . Essential hypertension, benign   . Heart murmur    as a child   . History of kidney stones   . Lumbago   . Mixed hyperlipidemia   . Myocardial infarction [facility]) age 30  . Non Hodgkin's lymphoma ([place]) 2002   a. stage IV, chemo done  . Personal history of colonic polyps     Tobacco Use: Social History   Tobacco Use  Smoking Status Never Smoker  Smokeless Tobacco Current User    . Types: Snuff  Tobacco Comment   12/05/2013 "use snuff q now and again"    Labs: Recent Review Flowsheet Data    Labs for ITP Cardiac and Pulmonary Rehab Latest Ref Rng & Units 07/15/2015 12/15/2018 10/01/2019 12/18/2019 12/19/2019   Cholestrol 0 - 200 mg/dL 148 - - - 104   LDLCALC 0 - 99 mg/dL UNABLE TO CALCULATE IF TRIGLYCERIDE OVER 400 mg/dL - - - 14   HDL >40 mg/dL 28(L) - - - 25(L)   Trlycerides <150 mg/dL 473(H) - - - 326(H)   Hemoglobin A1c 4.8 - 5.6 % - 6.8(H) - 6.8(H) -   TCO2 22 - 32 mmol/L - - 30 - -      Capillary Blood Glucose: Lab Results  Component Value Date   GLUCAP 96 04/14/2020   GLUCAP 79 04/14/2020   GLUCAP 126 (H) 12/20/2019   GLUCAP 281 (H) 12/20/2019   GLUCAP 192 (H) 12/20/2019     Exercise Target Goals: Exercise Program Goal: Individual exercise prescription set using results from initial 6 min walk test and THRR while considering  patient's activity barriers and safety.   Exercise Prescription Goal: Starting with aerobic activity 30 plus minutes a day, 3 days per week for initial exercise prescription. Provide home exercise prescription and guidelines  that participant acknowledges understanding prior to discharge.  Activity Barriers & Risk Stratification:   6 Minute Walk:  6 Minute Walk    Row Name 04/14/20 1416         6 Minute Walk   Phase Initial     Distance 1400 feet     Walk Time 6 minutes     # of Rest Breaks 0     MPH 2.96     METS 2.7     RPE 9     VO2 Peak 10.37     Symptoms Yes (comment)     Comments Pt was experiencing chest tightness     Resting HR 69 bpm     Resting BP 118/72     Resting Oxygen Saturation  97 %     Max Ex. HR 84 bpm     Max Ex. BP 138/74     2 Minute Post BP 118/72            Oxygen Initial Assessment:   Oxygen Re-Evaluation:   Oxygen Discharge (Final Oxygen Re-Evaluation):   Initial Exercise Prescription:  Initial Exercise Prescription - 04/14/20 1400      Date of Initial Exercise RX  and Referring Provider   Date 04/14/20    Expected Discharge Date 07/07/20      Treadmill   MPH 2    Grade 0    Minutes 17      Recumbant Elliptical   Level 1    RPM 60    Minutes 22      Prescription Details   Duration Progress to 30 minutes of continuous aerobic without signs/symptoms of physical distress      Intensity   THRR 40-80% of Max Heartrate 61-122    Ratings of Perceived Exertion 11-13      Progression   Progression Continue progressive overload as per policy without signs/symptoms or physical distress.      Resistance Training   Training Prescription Yes    Weight 3    Reps 10-15           Perform Capillary Blood Glucose checks as needed.  Exercise Prescription Changes:   Exercise Comments:   Exercise Goals and Review:   Exercise Goals    Row Name 04/14/20 1433             Exercise Goals   Increase Physical Activity Yes       Intervention Provide advice, education, support and counseling about physical activity/exercise needs.;Develop an individualized exercise prescription for aerobic and resistive training based on initial evaluation findings, risk stratification, comorbidities and participant's personal goals.       Expected Outcomes Short Term: Attend rehab on a regular basis to increase amount of physical activity.;Long Term: Add in home exercise to make exercise part of routine and to increase amount of physical activity.;Long Term: Exercising regularly at least 3-5 days a week.       Increase Strength and Stamina Yes       Intervention Provide advice, education, support and counseling about physical activity/exercise needs.;Develop an individualized exercise prescription for aerobic and resistive training based on initial evaluation findings, risk stratification, comorbidities and participant's personal goals.       Expected Outcomes Short Term: Increase workloads from initial exercise prescription for resistance, speed, and METs.;Short  Term: Perform resistance training exercises routinely during rehab and add in resistance training at home;Long Term: Improve cardiorespiratory fitness, muscular endurance and strength as measured by increased METs and functional  capacity (6MWT)       Able to understand and use rate of perceived exertion (RPE) scale Yes       Intervention Provide education and explanation on how to use RPE scale       Expected Outcomes Short Term: Able to use RPE daily in rehab to express subjective intensity level;Long Term:  Able to use RPE to guide intensity level when exercising independently       Knowledge and understanding of Target Heart Rate Range (THRR) Yes       Intervention Provide education and explanation of THRR including how the numbers were predicted and where they are located for reference       Expected Outcomes Short Term: Able to state/look up THRR;Long Term: Able to use THRR to govern intensity when exercising independently;Short Term: Able to use daily as guideline for intensity in rehab       Able to check pulse independently Yes       Intervention Provide education and demonstration on how to check pulse in carotid and radial arteries.;Review the importance of being able to check your own pulse for safety during independent exercise       Expected Outcomes Short Term: Able to explain why pulse checking is important during independent exercise;Long Term: Able to check pulse independently and accurately       Understanding of Exercise Prescription Yes       Intervention Provide education, explanation, and written materials on patient's individual exercise prescription       Expected Outcomes Short Term: Able to explain program exercise prescription;Long Term: Able to explain home exercise prescription to exercise independently              Exercise Goals Re-Evaluation :    Discharge Exercise Prescription (Final Exercise Prescription Changes):   Nutrition:  Target Goals: Understanding  of nutrition guidelines, daily intake of sodium 1500mg , cholesterol 200mg , calories 30% from fat and 7% or less from saturated fats, daily to have 5 or more servings of fruits and vegetables.  Biometrics:  Pre Biometrics - 04/14/20 1434      Pre Biometrics   Height 5\' 11"  (1.803 m)    Weight 100.9 kg    Waist Circumference 43.5 inches    Hip Circumference 44 inches    Waist to Hip Ratio 0.99 %    BMI (Calculated) 31.03    Triceps Skinfold 11 mm    % Body Fat 29.1 %    Grip Strength 42 kg    Flexibility 13 in    Single Leg Stand 60 seconds   60 seconds           Nutrition Therapy Plan and Nutrition Goals:  Nutrition Therapy & Goals - 04/14/20 1414      Personal Nutrition Goals   Comments Patient's medficts diet assessment score was 59. He says he is trying to eat healthy. Reviewed the areas he scored high and the handout provided regarding healthier choices. Will continue to monitor.      Intervention Plan   Intervention Nutrition handout(s) given to patient.           Nutrition Assessments:  Nutrition Assessments - 04/14/20 1415      MEDFICTS Scores   Pre Score 59          MEDIFICTS Score Key:  ?70 Need to make dietary changes   40-70 Heart Healthy Diet  ? 40 Therapeutic Level Cholesterol Diet   Picture Your Plate Scores:  <97 Unhealthy  dietary pattern with much room for improvement.  41-50 Dietary pattern unlikely to meet recommendations for good health and room for improvement.  51-60 More healthful dietary pattern, with some room for improvement.   >60 Healthy dietary pattern, although there may be some specific behaviors that could be improved.    Nutrition Goals Re-Evaluation:   Nutrition Goals Discharge (Final Nutrition Goals Re-Evaluation):   Psychosocial: Target Goals: Acknowledge presence or absence of significant depression and/or stress, maximize coping skills, provide positive support system. Participant is able to verbalize types  and ability to use techniques and skills needed for reducing stress and depression.  Initial Review & Psychosocial Screening:  Initial Psych Review & Screening - 04/14/20 1411      Initial Review   Current issues with None Identified      Family Dynamics   Good Support System? Yes    Comments Patient lives with his girlfriend. He says she and other friends are his support system. He has a positive outlook about his future. He denies any depression or anxiety. Will continue to monitor.      Barriers   Psychosocial barriers to participate in program There are no identifiable barriers or psychosocial needs.      Screening Interventions   Interventions Encouraged to exercise    Expected Outcomes Short Term goal: Utilizing psychosocial counselor, staff and physician to assist with identification of specific Stressors or current issues interfering with healing process. Setting desired goal for each stressor or current issue identified.;Short Term goal: Identification and review with participant of any Quality of Life or Depression concerns found by scoring the questionnaire.           Quality of Life Scores:  Quality of Life - 04/14/20 1414      Quality of Life   Select Quality of Life      Quality of Life Scores   Health/Function Pre 23.6 %    Socioeconomic Pre 28.07 %    Psych/Spiritual Pre 30 %    Family Pre 26.4 %    GLOBAL Pre 26.25 %          Scores of 19 and below usually indicate a poorer quality of life in these areas.  A difference of  2-3 points is a clinically meaningful difference.  A difference of 2-3 points in the total score of the Quality of Life Index has been associated with significant improvement in overall quality of life, self-image, physical symptoms, and general health in studies assessing change in quality of life.  PHQ-9: Recent Review Flowsheet Data    Depression screen [facility] 2/9 04/14/2020 01/20/2014   Decreased Interest 0 0   Down, Depressed, Hopeless  0 0   PHQ - 2 Score 0 0   Altered sleeping 0 -   Tired, decreased energy 1 -   Change in appetite 0 -   Feeling bad or failure about yourself  0 -   Trouble concentrating 0 -   Moving slowly or fidgety/restless 0 -   Suicidal thoughts 0 -   PHQ-9 Score 1 -   Difficult doing work/chores Not difficult at all -     Interpretation of Total Score  Total Score Depression Severity:  1-4 = Minimal depression, 5-9 = Mild depression, 10-14 = Moderate depression, 15-19 = Moderately severe depression, 20-27 = Severe depression   Psychosocial Evaluation and Intervention:  Psychosocial Evaluation - 04/14/20 1413      Psychosocial Evaluation & Interventions   Interventions Stress management education;Relaxation education;Encouraged  to exercise with the program and follow exercise prescription    Comments Patient has no psychosocial issues identified at his orientation visit. His inital QOL was 26.25 overall and his PHQ-9 socre was 1. Will continue to monitor.    Expected Outcomes Patient will have no psychosocial issues identified at discharge.    Continue Psychosocial Services  No Follow up required           Psychosocial Re-Evaluation:   Psychosocial Discharge (Final Psychosocial Re-Evaluation):   Vocational Rehabilitation: Provide vocational rehab assistance to qualifying candidates.   Vocational Rehab Evaluation & Intervention:  Vocational Rehab - 04/14/20 1415      Initial Vocational Rehab Evaluation & Intervention   Assessment shows need for Vocational Rehabilitation No      Vocational Rehab Re-Evaulation   Comments Patient says he would like to get a part time job after he finishes rehab but is not interested in vocational rehab.           Education: Education Goals: Education classes will be provided on a weekly basis, covering required topics. Participant will state understanding/return demonstration of topics presented.  Learning Barriers/Preferences:  Learning  Barriers/Preferences - 04/14/20 1415      Learning Barriers/Preferences   Learning Barriers None    Learning Preferences Audio           Education Topics: Hypertension, Hypertension Reduction -Define heart disease and high blood pressure. Discus how high blood pressure affects the body and ways to reduce high blood pressure.   Exercise and Your Heart -Discuss why it is important to exercise, the FITT principles of exercise, normal and abnormal responses to exercise, and how to exercise safely.   Angina -Discuss definition of angina, causes of angina, treatment of angina, and how to decrease risk of having angina.   Cardiac Medications -Review what the following cardiac medications are used for, how they affect the body, and side effects that may occur when taking the medications.  Medications include Aspirin, Beta blockers, calcium channel blockers, ACE Inhibitors, angiotensin receptor blockers, diuretics, digoxin, and antihyperlipidemics.   Congestive Heart Failure -Discuss the definition of CHF, how to live with CHF, the signs and symptoms of CHF, and how keep track of weight and sodium intake.   Heart Disease and Intimacy -Discus the effect sexual activity has on the heart, how changes occur during intimacy as we age, and safety during sexual activity.   Smoking Cessation / COPD -Discuss different methods to quit smoking, the health benefits of quitting smoking, and the definition of COPD.   Nutrition I: Fats -Discuss the types of cholesterol, what cholesterol does to the heart, and how cholesterol levels can be controlled.   Nutrition II: Labels -Discuss the different components of food labels and how to read food label   Heart Parts/Heart Disease and PAD -Discuss the anatomy of the heart, the pathway of blood circulation through the heart, and these are affected by heart disease.   Stress I: Signs and Symptoms -Discuss the causes of stress, how stress may lead  to anxiety and depression, and ways to limit stress.   Stress II: Relaxation -Discuss different types of relaxation techniques to limit stress.   Warning Signs of Stroke / TIA -Discuss definition of a stroke, what the signs and symptoms are of a stroke, and how to identify when someone is having stroke.   Knowledge Questionnaire Score:  Knowledge Questionnaire Score - 04/14/20 1415      Knowledge Questionnaire Score   Pre Score 23/28  Core Components/Risk Factors/Patient Goals at Admission:  Personal Goals and Risk Factors at Admission - 04/14/20 1416      Core Components/Risk Factors/Patient Goals on Admission    Weight Management Yes    Intervention Weight Management: Provide education and appropriate resources to help participant work on and attain dietary goals.;Weight Management/Obesity: Establish reasonable short term and long term weight goals.    Admit Weight 223 lb 14.4 oz (101.6 kg)    Goal Weight: Short Term 218 lb 14.4 oz (99.3 kg)    Goal Weight: Long Term 213 lb 9.6 oz (96.9 kg)    Expected Outcomes Long Term: Adherence to nutrition and physical activity/exercise program aimed toward attainment of established weight goal    Diabetes Yes    Intervention Provide education about signs/symptoms and action to take for hypo/hyperglycemia.;Provide education about proper nutrition, including hydration, and aerobic/resistive exercise prescription along with prescribed medications to achieve blood glucose in normal ranges: Fasting glucose 65-99 mg/dL    Expected Outcomes Short Term: Participant verbalizes understanding of the signs/symptoms and immediate care of hyper/hypoglycemia, proper foot care and importance of medication, aerobic/resistive exercise and nutrition plan for blood glucose control.;Long Term: Attainment of HbA1C < 7%.    Personal Goal Other Yes    Personal Goal Lose 15 to 20 lbs. Increase energy. Complete the program.    Intervention Patient will  attend CR 3 days/week and supplement with exercise at home 2 days/week.    Expected Outcomes Patient will meet both program and personal goals.           Core Components/Risk Factors/Patient Goals Review:    Core Components/Risk Factors/Patient Goals at Discharge (Final Review):    ITP Comments:   Comments: Patient arrived for 1st visit/orientation/education at 1230. Patient was referred to CR by Dr. Harl Bowie due to NSTEMI (I21.4). During orientation advised patient on arrival and appointment times what to wear, what to do before, during and after exercise. Reviewed attendance and class policy.  Pt is scheduled to return Cardiac Rehab on 04/19/20 at 815. Pt was advised to come to class 15 minutes before class starts.  Discussed RPE/Dpysnea scales. Patient participated in warm up stretches. Patient was able to complete 6 minute walk test.  Telemetry:NSR. Patient was measured for the equipment. Discussed equipment safety with patient. Took patient pre-anthropometric measurements. Patient finished visit at 1400.

## 2020-04-19 ENCOUNTER — Encounter (HOSPITAL_COMMUNITY): Payer: Self-pay

## 2020-04-19 ENCOUNTER — Encounter (HOSPITAL_COMMUNITY): Payer: Medicare Other

## 2020-04-21 ENCOUNTER — Encounter (HOSPITAL_COMMUNITY): Payer: Medicare Other

## 2020-04-23 ENCOUNTER — Encounter (HOSPITAL_COMMUNITY): Payer: Medicare Other

## 2020-04-26 ENCOUNTER — Encounter (HOSPITAL_COMMUNITY): Payer: Medicare Other

## 2020-04-28 ENCOUNTER — Encounter (HOSPITAL_COMMUNITY)
Admission: RE | Admit: 2020-04-28 | Discharge: 2020-04-28 | Disposition: A | Discharge status: Home / Self Care | Payer: Medicare Other | Source: Ambulatory Visit | Attending: Cardiology | Admitting: Cardiology

## 2020-04-28 ENCOUNTER — Other Ambulatory Visit: Payer: Self-pay

## 2020-04-28 DIAGNOSIS — I214 Non-ST elevation (NSTEMI) myocardial infarction: Secondary | ICD-10-CM | POA: Insufficient documentation

## 2020-04-28 NOTE — Progress Notes (Signed)
Daily Session Note  Patient Details  Name: Ryan Vaughn MRN: [id number] Date of Birth: [birth year] Referring Provider:    Encounter Date: 04/28/2020  Check In:  Session Check In - 04/28/20 0815      Check-In   Supervising physician immediately available to respond to emergencies [facility] MD immediately available    Physician(s) Dr. Harl Bowie    Location [facility]    Staff Present [name], MS, Exercise Physiologist;[name], MS, ACSM-CEP, Exercise Physiologist    Virtual Visit No    Medication changes reported     No    Fall or balance concerns reported    No    Tobacco Cessation No Change    Warm-up and Cool-down Performed as group-led instruction    Resistance Training Performed Yes    VAD Patient? No    PAD/SET Patient? No      Pain Assessment   Currently in Pain? No/denies    Multiple Pain Sites No           Capillary Blood Glucose: No results found for this or any previous visit (from the past 24 hour(s)).    Social History   Tobacco Use  Smoking Status Never Smoker  Smokeless Tobacco Current User  . Types: Snuff  Tobacco Comment   12/05/2013 "use snuff q now and again"    Goals Met:  Independence with exercise equipment Exercise tolerated well No report of cardiac concerns or symptoms Strength training completed today  Goals Unmet:  Not Applicable  Comments: check out 0915   Dr. Kathie Dike is Medical Director for [facility] Pulmonary Rehab.

## 2020-04-30 ENCOUNTER — Encounter (HOSPITAL_COMMUNITY)
Admission: RE | Admit: 2020-04-30 | Discharge: 2020-04-30 | Disposition: A | Discharge status: Home / Self Care | Payer: Medicare Other | Source: Ambulatory Visit | Attending: Cardiology | Admitting: Cardiology

## 2020-04-30 ENCOUNTER — Other Ambulatory Visit: Payer: Self-pay

## 2020-04-30 DIAGNOSIS — I214 Non-ST elevation (NSTEMI) myocardial infarction: Secondary | ICD-10-CM

## 2020-04-30 NOTE — Progress Notes (Signed)
Daily Session Note  Patient Details  Name: Ryan Vaughn MRN: [id number] Date of Birth: January 07, 1951 Referring Provider:    Encounter Date: 04/30/2020  Check In:  Session Check In - 04/30/20 0815      Check-In   Supervising physician immediately available to respond to emergencies [facility] MD immediately available    Physician(s) Dr. Harl Bowie    Staff Present Geanie Cooley, RN;[place] Audria Nine, MS, Exercise Physiologist;[name], MS, ACSM-CEP, Exercise Physiologist    Virtual Visit No    Medication changes reported     No    Fall or balance concerns reported    No    Tobacco Cessation No Change    Resistance Training Performed Yes    VAD Patient? No    PAD/SET Patient? No      Pain Assessment   Currently in Pain? No/denies    Pain Score 0-No pain    Multiple Pain Sites No           Capillary Blood Glucose: No results found for this or any previous visit (from the past 24 hour(s)).    Social History   Tobacco Use  Smoking Status Never Smoker  Smokeless Tobacco Current User  . Types: Snuff  Tobacco Comment   12/05/2013 "use snuff q now and again"    Goals Met:  Independence with exercise equipment Exercise tolerated well No report of cardiac concerns or symptoms Strength training completed today  Goals Unmet:  Not Applicable  Comments: check out @ 9:15   Dr. Kathie Dike is Medical Director for [facility] Pulmonary Rehab.

## 2020-05-03 ENCOUNTER — Encounter (HOSPITAL_COMMUNITY)
Admission: RE | Admit: 2020-05-03 | Discharge: 2020-05-03 | Disposition: A | Discharge status: Home / Self Care | Payer: Medicare Other | Source: Ambulatory Visit | Attending: Cardiology | Admitting: Cardiology

## 2020-05-03 ENCOUNTER — Other Ambulatory Visit: Payer: Self-pay

## 2020-05-03 VITALS — Wt 221.6 lb

## 2020-05-03 DIAGNOSIS — I214 Non-ST elevation (NSTEMI) myocardial infarction: Secondary | ICD-10-CM | POA: Diagnosis not present

## 2020-05-03 NOTE — Progress Notes (Signed)
Daily Session Note  Patient Details  Name: KEYWON MESTRE MRN: [id number] Date of Birth: Feb 01, 1951 Referring Provider:    Encounter Date: 05/03/2020  Check In:  Session Check In - 05/03/20 0815      Check-In   Supervising physician immediately available to respond to emergencies [facility] MD immediately available    Physician(s) Dr. Harl Bowie    Location [facility]    Staff Present Geanie Cooley, RN;Charnele Semple Audria Nine, MS, Exercise Physiologist;[name], MS, ACSM-CEP, Exercise Physiologist    Virtual Visit No    Medication changes reported     No    Fall or balance concerns reported    No    Tobacco Cessation No Change    Warm-up and Cool-down Performed as group-led instruction    Resistance Training Performed Yes    VAD Patient? No    PAD/SET Patient? No      Pain Assessment   Currently in Pain? No/denies    Multiple Pain Sites No           Capillary Blood Glucose: No results found for this or any previous visit (from the past 24 hour(s)).    Social History   Tobacco Use  Smoking Status Never Smoker  Smokeless Tobacco Current User  . Types: Snuff  Tobacco Comment   12/05/2013 "use snuff q now and again"    Goals Met:  Independence with exercise equipment Exercise tolerated well No report of cardiac concerns or symptoms Strength training completed today  Goals Unmet:  Not Applicable  Comments: check out 0915   Dr. Kathie Dike is Medical Director for [facility] Pulmonary Rehab.

## 2020-05-05 ENCOUNTER — Encounter (HOSPITAL_COMMUNITY)
Admission: RE | Admit: 2020-05-05 | Discharge: 2020-05-05 | Disposition: A | Discharge status: Home / Self Care | Payer: Medicare Other | Source: Ambulatory Visit | Attending: Cardiology | Admitting: Cardiology

## 2020-05-05 ENCOUNTER — Other Ambulatory Visit: Payer: Self-pay

## 2020-05-05 DIAGNOSIS — I214 Non-ST elevation (NSTEMI) myocardial infarction: Secondary | ICD-10-CM

## 2020-05-05 NOTE — Progress Notes (Signed)
Cardiac Individual Treatment Plan  Patient Details  Name: Ryan Vaughn MRN: [id number] Date of Birth: 1950/09/08 Referring Provider:    Initial Encounter Date:  [place] from 04/14/2020 in [place]  Date 04/14/20      Visit Diagnosis: NSTEMI (non-ST elevated myocardial infarction) [facility])  Patient's Home Medications on Admission:  Current Outpatient Medications:  .  aspirin EC 81 MG EC tablet, Take 1 tablet (81 mg total) by mouth daily. Swallow whole., Disp: 30 tablet, Rfl: 11 .  clopidogrel (PLAVIX) 75 MG tablet, Take 1 tablet (75 mg total) by mouth daily., Disp: 30 tablet, Rfl: 6 .  metFORMIN (GLUCOPHAGE) 500 MG tablet, Take 500 mg by mouth 2 (two) times daily with a meal., Disp: , Rfl:  .  metoprolol succinate (TOPROL-XL) 50 MG 24 hr tablet, Take 50 mg by mouth daily., Disp: , Rfl:  .  nitroGLYCERIN (NITROSTAT) 0.4 MG SL tablet, Place 1 tablet (0.4 mg total) under the tongue every 5 (five) minutes x 3 doses as needed for chest pain., Disp: 25 tablet, Rfl: 3 .  ranolazine (RANEXA) 500 MG 12 hr tablet, Take 1 tablet (500 mg total) by mouth 2 (two) times daily., Disp: 60 tablet, Rfl: 6 .  rosuvastatin (CRESTOR) 40 MG tablet, TAKE 1 TABLET(40 MG) BY MOUTH DAILY, Disp: 90 tablet, Rfl: 1  Past Medical History: Past Medical History:  Diagnosis Date  . Arthritis   . CAD (coronary artery disease)    a. DES to OM 1 and DES mLCX (12/05/13) b. CABG x1  ('92) c. 6 stents placed in 2009? (pt hx)   . Diabetes mellitus without complication ([place])    type 2  . Essential hypertension, benign   . Heart murmur    as a child   . History of kidney stones   . Lumbago   . Mixed hyperlipidemia   . Myocardial infarction [facility]) age 69  . Non Hodgkin's lymphoma ([place]) 2002   a. stage IV, chemo done  . Personal history of colonic polyps     Tobacco Use: Social History   Tobacco Use  Smoking Status Never Smoker  Smokeless Tobacco  Current User  . Types: Snuff  Tobacco Comment   12/05/2013 "use snuff q now and again"    Labs: Recent Review Flowsheet Data    Labs for ITP Cardiac and Pulmonary Rehab Latest Ref Rng & Units 07/15/2015 12/15/2018 10/01/2019 12/18/2019 12/19/2019   Cholestrol 0 - 200 mg/dL 148 - - - 104   LDLCALC 0 - 99 mg/dL UNABLE TO CALCULATE IF TRIGLYCERIDE OVER 400 mg/dL - - - 14   HDL >40 mg/dL 28(L) - - - 25(L)   Trlycerides <150 mg/dL 473(H) - - - 326(H)   Hemoglobin A1c 4.8 - 5.6 % - 6.8(H) - 6.8(H) -   TCO2 22 - 32 mmol/L - - 30 - -      Capillary Blood Glucose: Lab Results  Component Value Date   GLUCAP 96 04/14/2020   GLUCAP 79 04/14/2020   GLUCAP 126 (H) 12/20/2019   GLUCAP 281 (H) 12/20/2019   GLUCAP 192 (H) 12/20/2019     Exercise Target Goals: Exercise Program Goal: Individual exercise prescription set using results from initial 6 min walk test and THRR while considering  patient's activity barriers and safety.   Exercise Prescription Goal: Starting with aerobic activity 30 plus minutes a day, 3 days per week for initial exercise prescription. Provide home exercise prescription and guidelines that  participant acknowledges understanding prior to discharge.  Activity Barriers & Risk Stratification:  Activity Barriers & Cardiac Risk Stratification - 05/04/20 1018      Activity Barriers & Cardiac Risk Stratification   Activity Barriers Arthritis;Back Problems    Cardiac Risk Stratification High           6 Minute Walk:  6 Minute Walk    Row Name 04/14/20 1416         6 Minute Walk   Phase Initial     Distance 1400 feet     Walk Time 6 minutes     # of Rest Breaks 0     MPH 2.96     METS 2.7     RPE 9     VO2 Peak 10.37     Symptoms Yes (comment)     Comments Pt was experiencing chest tightness     Resting HR 69 bpm     Resting BP 118/72     Resting Oxygen Saturation  97 %     Max Ex. HR 84 bpm     Max Ex. BP 138/74     2 Minute Post BP 118/72             Oxygen Initial Assessment:   Oxygen Re-Evaluation:   Oxygen Discharge (Final Oxygen Re-Evaluation):   Initial Exercise Prescription:  Initial Exercise Prescription - 04/14/20 1400      Date of Initial Exercise RX and Referring Provider   Date 04/14/20    Expected Discharge Date 07/07/20      Treadmill   MPH 2    Grade 0    Minutes 17      Recumbant Elliptical   Level 1    RPM 60    Minutes 22      Prescription Details   Duration Progress to 30 minutes of continuous aerobic without signs/symptoms of physical distress      Intensity   THRR 40-80% of Max Heartrate 61-122    Ratings of Perceived Exertion 11-13      Progression   Progression Continue progressive overload as per policy without signs/symptoms or physical distress.      Resistance Training   Training Prescription Yes    Weight 3    Reps 10-15           Perform Capillary Blood Glucose checks as needed.  Exercise Prescription Changes:   Exercise Prescription Changes    Row Name 05/03/20 1100             Response to Exercise   Blood Pressure (Admit) 100/58       Blood Pressure (Exercise) 122/70       Blood Pressure (Exit) 110/72       Heart Rate (Admit) 68 bpm       Heart Rate (Exercise) 97 bpm       Heart Rate (Exit) 77 bpm       Rating of Perceived Exertion (Exercise) 11       Duration Continue with 30 min of aerobic exercise without signs/symptoms of physical distress.       Intensity THRR unchanged               Progression   Progression Continue to progress workloads to maintain intensity without signs/symptoms of physical distress.               Resistance Training   Training Prescription Yes       Weight 3 lbs  Reps 10-15       Time 10 Minutes               Treadmill   MPH 2.1       Grade 0       Minutes 17       METs 2.61               Recumbant Elliptical   Level 1       RPM 71       Minutes 22       METs 4              Exercise Comments:    Exercise Comments    Row Name 04/28/20 0939           Exercise Comments Pt completed first exercise session. He tolerated exercise well and enjoyed his first day at rehab.              Exercise Goals and Review:   Exercise Goals    Row Name 04/14/20 1433 05/03/20 1159           Exercise Goals   Increase Physical Activity Yes Yes      Intervention Provide advice, education, support and counseling about physical activity/exercise needs.;Develop an individualized exercise prescription for aerobic and resistive training based on initial evaluation findings, risk stratification, comorbidities and participant's personal goals. Provide advice, education, support and counseling about physical activity/exercise needs.;Develop an individualized exercise prescription for aerobic and resistive training based on initial evaluation findings, risk stratification, comorbidities and participant's personal goals.      Expected Outcomes Short Term: Attend rehab on a regular basis to increase amount of physical activity.;Long Term: Add in home exercise to make exercise part of routine and to increase amount of physical activity.;Long Term: Exercising regularly at least 3-5 days a week. Short Term: Attend rehab on a regular basis to increase amount of physical activity.;Long Term: Add in home exercise to make exercise part of routine and to increase amount of physical activity.;Long Term: Exercising regularly at least 3-5 days a week.      Increase Strength and Stamina Yes Yes      Intervention Provide advice, education, support and counseling about physical activity/exercise needs.;Develop an individualized exercise prescription for aerobic and resistive training based on initial evaluation findings, risk stratification, comorbidities and participant's personal goals. Provide advice, education, support and counseling about physical activity/exercise needs.;Develop an individualized exercise prescription for  aerobic and resistive training based on initial evaluation findings, risk stratification, comorbidities and participant's personal goals.      Expected Outcomes Short Term: Increase workloads from initial exercise prescription for resistance, speed, and METs.;Short Term: Perform resistance training exercises routinely during rehab and add in resistance training at home;Long Term: Improve cardiorespiratory fitness, muscular endurance and strength as measured by increased METs and functional capacity (6MWT) Short Term: Increase workloads from initial exercise prescription for resistance, speed, and METs.;Short Term: Perform resistance training exercises routinely during rehab and add in resistance training at home;Long Term: Improve cardiorespiratory fitness, muscular endurance and strength as measured by increased METs and functional capacity (6MWT)      Able to understand and use rate of perceived exertion (RPE) scale Yes Yes      Intervention Provide education and explanation on how to use RPE scale Provide education and explanation on how to use RPE scale      Expected Outcomes Short Term: Able to use RPE daily in rehab to express subjective intensity  level;Long Term:  Able to use RPE to guide intensity level when exercising independently Short Term: Able to use RPE daily in rehab to express subjective intensity level;Long Term:  Able to use RPE to guide intensity level when exercising independently      Knowledge and understanding of Target Heart Rate Range (THRR) Yes Yes      Intervention Provide education and explanation of THRR including how the numbers were predicted and where they are located for reference Provide education and explanation of THRR including how the numbers were predicted and where they are located for reference      Expected Outcomes Short Term: Able to state/look up THRR;Long Term: Able to use THRR to govern intensity when exercising independently;Short Term: Able to use daily as  guideline for intensity in rehab Short Term: Able to state/look up THRR;Long Term: Able to use THRR to govern intensity when exercising independently;Short Term: Able to use daily as guideline for intensity in rehab      Able to check pulse independently Yes Yes      Intervention Provide education and demonstration on how to check pulse in carotid and radial arteries.;Review the importance of being able to check your own pulse for safety during independent exercise Provide education and demonstration on how to check pulse in carotid and radial arteries.;Review the importance of being able to check your own pulse for safety during independent exercise      Expected Outcomes Short Term: Able to explain why pulse checking is important during independent exercise;Long Term: Able to check pulse independently and accurately Short Term: Able to explain why pulse checking is important during independent exercise;Long Term: Able to check pulse independently and accurately      Understanding of Exercise Prescription Yes Yes      Intervention Provide education, explanation, and written materials on patient's individual exercise prescription Provide education, explanation, and written materials on patient's individual exercise prescription      Expected Outcomes Short Term: Able to explain program exercise prescription;Long Term: Able to explain home exercise prescription to exercise independently Short Term: Able to explain program exercise prescription;Long Term: Able to explain home exercise prescription to exercise independently             Exercise Goals Re-Evaluation :  Exercise Goals Re-Evaluation    Row Name 05/03/20 1200             Exercise Goal Re-Evaluation   Exercise Goals Review Increase Physical Activity;Increase Strength and Stamina;Able to understand and use rate of perceived exertion (RPE) scale;Knowledge and understanding of Target Heart Rate Range (THRR);Able to check pulse  independently;Understanding of Exercise Prescription       Comments Pt has attended 3 exercise sessions. He is motivated to exercise and shows a motivated outlook. He currently exercises at 4.0 METs on the elliptical. Will continue to monitor and progress as able.       Expected Outcomes Through exercise at rehab and by engaging in a home exercise program the patient will reach their goals.               Discharge Exercise Prescription (Final Exercise Prescription Changes):  Exercise Prescription Changes - 05/03/20 1100      Response to Exercise   Blood Pressure (Admit) 100/58    Blood Pressure (Exercise) 122/70    Blood Pressure (Exit) 110/72    Heart Rate (Admit) 68 bpm    Heart Rate (Exercise) 97 bpm    Heart Rate (Exit) 77 bpm  Rating of Perceived Exertion (Exercise) 11    Duration Continue with 30 min of aerobic exercise without signs/symptoms of physical distress.    Intensity THRR unchanged      Progression   Progression Continue to progress workloads to maintain intensity without signs/symptoms of physical distress.      Resistance Training   Training Prescription Yes    Weight 3 lbs    Reps 10-15    Time 10 Minutes      Treadmill   MPH 2.1    Grade 0    Minutes 17    METs 2.61      Recumbant Elliptical   Level 1    RPM 71    Minutes 22    METs 4           Nutrition:  Target Goals: Understanding of nutrition guidelines, daily intake of sodium 1500mg , cholesterol 200mg , calories 30% from fat and 7% or less from saturated fats, daily to have 5 or more servings of fruits and vegetables.  Biometrics:  Pre Biometrics - 04/14/20 1434      Pre Biometrics   Height 5\' 11"  (1.803 m)    Weight 100.9 kg    Waist Circumference 43.5 inches    Hip Circumference 44 inches    Waist to Hip Ratio 0.99 %    BMI (Calculated) 31.03    Triceps Skinfold 11 mm    % Body Fat 29.1 %    Grip Strength 42 kg    Flexibility 13 in    Single Leg Stand 60 seconds   60  seconds           Nutrition Therapy Plan and Nutrition Goals:  Nutrition Therapy & Goals - 04/28/20 0746      Personal Nutrition Goals   Comments We will continue to provide education through handouts.      Intervention Plan   Intervention Nutrition handout(s) given to patient.           Nutrition Assessments:  Nutrition Assessments - 04/14/20 1415      MEDFICTS Scores   Pre Score 59          MEDIFICTS Score Key:  ?70 Need to make dietary changes   40-70 Heart Healthy Diet  ? 40 Therapeutic Level Cholesterol Diet   Picture Your Plate Scores:  <67 Unhealthy dietary pattern with much room for improvement.  41-50 Dietary pattern unlikely to meet recommendations for good health and room for improvement.  51-60 More healthful dietary pattern, with some room for improvement.   >60 Healthy dietary pattern, although there may be some specific behaviors that could be improved.    Nutrition Goals Re-Evaluation:   Nutrition Goals Discharge (Final Nutrition Goals Re-Evaluation):   Psychosocial: Target Goals: Acknowledge presence or absence of significant depression and/or stress, maximize coping skills, provide positive support system. Participant is able to verbalize types and ability to use techniques and skills needed for reducing stress and depression.  Initial Review & Psychosocial Screening:  Initial Psych Review & Screening - 04/14/20 1411      Initial Review   Current issues with None Identified      Family Dynamics   Good Support System? Yes    Comments Patient lives with his girlfriend. He says she and other friends are his support system. He has a positive outlook about his future. He denies any depression or anxiety. Will continue to monitor.      Barriers   Psychosocial barriers to participate in  program There are no identifiable barriers or psychosocial needs.      Screening Interventions   Interventions Encouraged to exercise    Expected  Outcomes Short Term goal: Utilizing psychosocial counselor, staff and physician to assist with identification of specific Stressors or current issues interfering with healing process. Setting desired goal for each stressor or current issue identified.;Short Term goal: Identification and review with participant of any Quality of Life or Depression concerns found by scoring the questionnaire.           Quality of Life Scores:  Quality of Life - 04/14/20 1414      Quality of Life   Select Quality of Life      Quality of Life Scores   Health/Function Pre 23.6 %    Socioeconomic Pre 28.07 %    Psych/Spiritual Pre 30 %    Family Pre 26.4 %    GLOBAL Pre 26.25 %          Scores of 19 and below usually indicate a poorer quality of life in these areas.  A difference of  2-3 points is a clinically meaningful difference.  A difference of 2-3 points in the total score of the Quality of Life Index has been associated with significant improvement in overall quality of life, self-image, physical symptoms, and general health in studies assessing change in quality of life.  PHQ-9: Recent Review Flowsheet Data    Depression screen [facility] 2/9 04/14/2020 01/20/2014   Decreased Interest 0 0   Down, Depressed, Hopeless 0 0   PHQ - 2 Score 0 0   Altered sleeping 0 -   Tired, decreased energy 1 -   Change in appetite 0 -   Feeling bad or failure about yourself  0 -   Trouble concentrating 0 -   Moving slowly or fidgety/restless 0 -   Suicidal thoughts 0 -   PHQ-9 Score 1 -   Difficult doing work/chores Not difficult at all -     Interpretation of Total Score  Total Score Depression Severity:  1-4 = Minimal depression, 5-9 = Mild depression, 10-14 = Moderate depression, 15-19 = Moderately severe depression, 20-27 = Severe depression   Psychosocial Evaluation and Intervention:  Psychosocial Evaluation - 04/14/20 1413      Psychosocial Evaluation & Interventions   Interventions Stress management  education;Relaxation education;Encouraged to exercise with the program and follow exercise prescription    Comments Patient has no psychosocial issues identified at his orientation visit. His inital QOL was 26.25 overall and his PHQ-9 socre was 1. Will continue to monitor.    Expected Outcomes Patient will have no psychosocial issues identified at discharge.    Continue Psychosocial Services  No Follow up required           Psychosocial Re-Evaluation:   Psychosocial Discharge (Final Psychosocial Re-Evaluation):   Vocational Rehabilitation: Provide vocational rehab assistance to qualifying candidates.   Vocational Rehab Evaluation & Intervention:  Vocational Rehab - 04/14/20 1415      Initial Vocational Rehab Evaluation & Intervention   Assessment shows need for Vocational Rehabilitation No      Vocational Rehab Re-Evaulation   Comments Patient says he would like to get a part time job after he finishes rehab but is not interested in vocational rehab.           Education: Education Goals: Education classes will be provided on a weekly basis, covering required topics. Participant will state understanding/return demonstration of topics presented.  Learning Barriers/Preferences:  Learning Barriers/Preferences - 04/14/20 1415      Learning Barriers/Preferences   Learning Barriers None    Learning Preferences Audio           Education Topics: Hypertension, Hypertension Reduction -Define heart disease and high blood pressure. Discus how high blood pressure affects the body and ways to reduce high blood pressure.   Exercise and Your Heart -Discuss why it is important to exercise, the FITT principles of exercise, normal and abnormal responses to exercise, and how to exercise safely.   Angina -Discuss definition of angina, causes of angina, treatment of angina, and how to decrease risk of having angina.   Cardiac Medications -Review what the following cardiac  medications are used for, how they affect the body, and side effects that may occur when taking the medications.  Medications include Aspirin, Beta blockers, calcium channel blockers, ACE Inhibitors, angiotensin receptor blockers, diuretics, digoxin, and antihyperlipidemics.   Congestive Heart Failure -Discuss the definition of CHF, how to live with CHF, the signs and symptoms of CHF, and how keep track of weight and sodium intake.   Heart Disease and Intimacy -Discus the effect sexual activity has on the heart, how changes occur during intimacy as we age, and safety during sexual activity.   Smoking Cessation / COPD -Discuss different methods to quit smoking, the health benefits of quitting smoking, and the definition of COPD.   Nutrition I: Fats -Discuss the types of cholesterol, what cholesterol does to the heart, and how cholesterol levels can be controlled.   Nutrition II: Labels -Discuss the different components of food labels and how to read food label   Heart Parts/Heart Disease and PAD -Discuss the anatomy of the heart, the pathway of blood circulation through the heart, and these are affected by heart disease.   Stress I: Signs and Symptoms -Discuss the causes of stress, how stress may lead to anxiety and depression, and ways to limit stress. Flowsheet Row CARDIAC REHAB PHASE II EXERCISE from 05/05/2020 in [place]  Date 05/05/20  Educator [facility]  Instruction Review Code 2- Demonstrated Understanding      Stress II: Relaxation -Discuss different types of relaxation techniques to limit stress.   Warning Signs of Stroke / TIA -Discuss definition of a stroke, what the signs and symptoms are of a stroke, and how to identify when someone is having stroke.   Knowledge Questionnaire Score:  Knowledge Questionnaire Score - 04/14/20 1415      Knowledge Questionnaire Score   Pre Score 23/28           Core Components/Risk Factors/Patient Goals  at Admission:  Personal Goals and Risk Factors at Admission - 04/14/20 1416      Core Components/Risk Factors/Patient Goals on Admission    Weight Management Yes    Intervention Weight Management: Provide education and appropriate resources to help participant work on and attain dietary goals.;Weight Management/Obesity: Establish reasonable short term and long term weight goals.    Admit Weight 223 lb 14.4 oz (101.6 kg)    Goal Weight: Short Term 218 lb 14.4 oz (99.3 kg)    Goal Weight: Long Term 213 lb 9.6 oz (96.9 kg)    Expected Outcomes Long Term: Adherence to nutrition and physical activity/exercise program aimed toward attainment of established weight goal    Diabetes Yes    Intervention Provide education about signs/symptoms and action to take for hypo/hyperglycemia.;Provide education about proper nutrition, including hydration, and aerobic/resistive exercise prescription along with prescribed medications to achieve  blood glucose in normal ranges: Fasting glucose 65-99 mg/dL    Expected Outcomes Short Term: Participant verbalizes understanding of the signs/symptoms and immediate care of hyper/hypoglycemia, proper foot care and importance of medication, aerobic/resistive exercise and nutrition plan for blood glucose control.;Long Term: Attainment of HbA1C < 7%.    Personal Goal Other Yes    Personal Goal Lose 15 to 20 lbs. Increase energy. Complete the program.    Intervention Patient will attend CR 3 days/week and supplement with exercise at home 2 days/week.    Expected Outcomes Patient will meet both program and personal goals.           Core Components/Risk Factors/Patient Goals Review:   Goals and Risk Factor Review    Row Name 04/28/20 0745             Core Components/Risk Factors/Patient Goals Review   Personal Goals Review Weight Management/Obesity;Diabetes       Review Patient has no started the program. He has been exposed to [place] and [place] He is  scheduled to start today. His goals are to lose 15 to 20 lbs; increase energy and complete the program. Will continue to monitor for progress.       Expected Outcomes Patient will complete the program meeting both program and personal goals.              Core Components/Risk Factors/Patient Goals at Discharge (Final Review):   Goals and Risk Factor Review - 04/28/20 0745      Core Components/Risk Factors/Patient Goals Review   Personal Goals Review Weight Management/Obesity;Diabetes    Review Patient has no started the program. He has been exposed to [place] and has been in [place]. He is scheduled to start today. His goals are to lose 15 to 20 lbs; increase energy and complete the program. Will continue to monitor for progress.    Expected Outcomes Patient will complete the program meeting both program and personal goals.           ITP Comments:   Comments: ITP REVIEW Pt is making expected progress toward Cardiac Rehab goals after completing 5 sessions. Recommend continued exercise, life style modification, education, and increased stamina and strength.

## 2020-05-05 NOTE — Progress Notes (Signed)
Daily Session Note  Patient Details  Name: Ryan Vaughn MRN: [id number] Date of Birth: June 30, 1950 Referring Provider:    Encounter Date: 05/05/2020  Check In:  Session Check In - 05/05/20 0815      Check-In   Supervising physician immediately available to respond to emergencies [facility] MD immediately available    Physician(s) Dr. Harl Bowie    Location [facility]    Staff Present [name], MS, Exercise Physiologist;[name], MS, ACSM-CEP, Exercise Physiologist    Virtual Visit No    Medication changes reported     No    Fall or balance concerns reported    No    Tobacco Cessation No Change    Warm-up and Cool-down Performed as group-led instruction    Resistance Training Performed Yes    VAD Patient? No    PAD/SET Patient? No      Pain Assessment   Currently in Pain? No/denies    Multiple Pain Sites No           Capillary Blood Glucose: No results found for this or any previous visit (from the past 24 hour(s)).    Social History   Tobacco Use  Smoking Status Never Smoker  Smokeless Tobacco Current User  . Types: Snuff  Tobacco Comment   12/05/2013 "use snuff q now and again"    Goals Met:  Independence with exercise equipment Exercise tolerated well No report of cardiac concerns or symptoms Strength training completed today  Goals Unmet:  Not Applicable  Comments: check out 0915   Dr. Kathie Dike is Medical Director for [facility].

## 2020-05-07 ENCOUNTER — Other Ambulatory Visit: Payer: Self-pay

## 2020-05-07 ENCOUNTER — Encounter (HOSPITAL_COMMUNITY)
Admission: RE | Admit: 2020-05-07 | Discharge: 2020-05-07 | Disposition: A | Discharge status: Home / Self Care | Payer: Medicare Other | Source: Ambulatory Visit | Attending: Cardiology | Admitting: Cardiology

## 2020-05-07 DIAGNOSIS — I214 Non-ST elevation (NSTEMI) myocardial infarction: Secondary | ICD-10-CM | POA: Diagnosis not present

## 2020-05-07 NOTE — Progress Notes (Signed)
Daily Session Note  Patient Details  Name: Ryan Vaughn MRN: [id number] Date of Birth: Mar 11, 1951 Referring Provider:    Encounter Date: 05/07/2020  Check In:  Session Check In - 05/07/20 0815      Check-In   Supervising physician immediately available to respond to emergencies [facility] immediately available    Physician(s) Dr. Harl Bowie    Location [facility]    Staff Present [name], MS, Exercise Physiologist;[name], MS, ACSM-CEP, Exercise Physiologist    Virtual Visit No    Medication changes reported     No    Fall or balance concerns reported    No    Tobacco Cessation No Change    Warm-up and Cool-down Performed as group-led instruction    Resistance Training Performed Yes    VAD Patient? No    PAD/SET Patient? No      Pain Assessment   Currently in Pain? No/denies    Multiple Pain Sites No           Capillary Blood Glucose: No results found for this or any previous visit (from the past 24 hour(s)).    Social History   Tobacco Use  Smoking Status Never Smoker  Smokeless Tobacco Current User  . Types: Snuff  Tobacco Comment   12/05/2013 "use snuff q now and again"    Goals Met:  Independence with exercise equipment Exercise tolerated well No report of cardiac concerns or symptoms Strength training completed today  Goals Unmet:  Not Applicable  Comments: check out 0915   Dr. Kathie Dike is Medical Director for [facility].

## 2020-05-10 ENCOUNTER — Encounter (HOSPITAL_COMMUNITY)
Admission: RE | Admit: 2020-05-10 | Discharge: 2020-05-10 | Disposition: A | Discharge status: Home / Self Care | Payer: Medicare Other | Source: Ambulatory Visit | Attending: Cardiology | Admitting: Cardiology

## 2020-05-10 ENCOUNTER — Other Ambulatory Visit: Payer: Self-pay

## 2020-05-10 DIAGNOSIS — I214 Non-ST elevation (NSTEMI) myocardial infarction: Secondary | ICD-10-CM

## 2020-05-10 NOTE — Progress Notes (Signed)
Daily Session Note  Patient Details  Name: Ryan Vaughn MRN: [id number] Date of Birth: 1951/02/07 Referring Provider:    Encounter Date: 05/10/2020  Check In:  Session Check In - 05/10/20 0815      Check-In   Supervising physician immediately available to respond to emergencies [facility] immediately available    Physician(s) Dr. Johnsie Cancel    Location [facility]    Staff Present [name], MS, Exercise Physiologist;Erik Burkett Kris Mouton, MS, ACSM-CEP, Exercise Physiologist;Debra Wynetta Emery, RN, BSN    Virtual Visit No    Medication changes reported     No    Fall or balance concerns reported    No    Tobacco Cessation No Change    Warm-up and Cool-down Performed as group-led instruction    Resistance Training Performed Yes    VAD Patient? No    PAD/SET Patient? No      Pain Assessment   Currently in Pain? No/denies    Pain Score 0-No pain    Multiple Pain Sites No           Capillary Blood Glucose: No results found for this or any previous visit (from the past 24 hour(s)).    Social History   Tobacco Use  Smoking Status Never Smoker  Smokeless Tobacco Current User  . Types: Snuff  Tobacco Comment   12/05/2013 "use snuff q now and again"    Goals Met:  Independence with exercise equipment Exercise tolerated well No report of cardiac concerns or symptoms Strength training completed today  Goals Unmet:  Not Applicable  Comments: checkout time is 0915   Dr. Kathie Dike is Medical Director for [facility].

## 2020-05-12 ENCOUNTER — Encounter (HOSPITAL_COMMUNITY)
Admission: RE | Admit: 2020-05-12 | Discharge: 2020-05-12 | Disposition: A | Discharge status: Home / Self Care | Payer: Medicare Other | Source: Ambulatory Visit | Attending: Cardiology | Admitting: Cardiology

## 2020-05-12 ENCOUNTER — Other Ambulatory Visit: Payer: Self-pay

## 2020-05-12 DIAGNOSIS — I214 Non-ST elevation (NSTEMI) myocardial infarction: Secondary | ICD-10-CM

## 2020-05-12 NOTE — Progress Notes (Signed)
Daily Session Note  Patient Details  Name: Ryan Vaughn MRN: [id number] Date of Birth: 11/02/1950 Referring Provider:    Encounter Date: 05/12/2020  Check In:  Session Check In - 05/12/20 0815      Check-In   Supervising physician immediately available to respond to emergencies [facility] immediately available    [facility]    Staff Present [name], MS, Exercise Physiologist;[name], MS, ACSM-CEP, Exercise Physiologist    Virtual Visit No    Medication changes reported     No    Fall or balance concerns reported    No    Tobacco Cessation No Change    Warm-up and Cool-down Performed as group-led instruction    Resistance Training Performed Yes    VAD Patient? No    PAD/SET Patient? No      Pain Assessment   Currently in Pain? No/denies    Multiple Pain Sites No           Capillary Blood Glucose: No results found for this or any previous visit (from the past 24 hour(s)).    Social History   Tobacco Use  Smoking Status Never Smoker  Smokeless Tobacco Current User   Types: Snuff  Tobacco Comment   12/05/2013 "use snuff q now and again"    Goals Met:  Independence with exercise equipment Exercise tolerated well No report of cardiac concerns or symptoms Strength training completed today  Goals Unmet:  Not Applicable  Comments: check out 0915   Dr. Kathie Dike is Medical Director for [facility].

## 2020-05-14 ENCOUNTER — Encounter (HOSPITAL_COMMUNITY): Payer: Medicare Other

## 2020-05-17 ENCOUNTER — Other Ambulatory Visit: Payer: Self-pay

## 2020-05-17 ENCOUNTER — Encounter (HOSPITAL_COMMUNITY)
Admission: RE | Admit: 2020-05-17 | Discharge: 2020-05-17 | Disposition: A | Discharge status: Home / Self Care | Payer: Medicare Other | Source: Ambulatory Visit | Attending: Cardiology | Admitting: Cardiology

## 2020-05-17 VITALS — Wt 222.9 lb

## 2020-05-17 DIAGNOSIS — I214 Non-ST elevation (NSTEMI) myocardial infarction: Secondary | ICD-10-CM

## 2020-05-17 NOTE — Progress Notes (Signed)
Daily Session Note  Patient Details  Name: Ryan Vaughn MRN: [id number] Date of Birth: Sep 29, 1950 Referring Provider:    Encounter Date: 05/17/2020  Check In:  Session Check In - 05/17/20 0815      Check-In   Supervising physician immediately available to respond to emergencies [facility] immediately available    Physician(s) Harrington Challenger    Staff Present Geanie Cooley, RN;Dalton Fletcher, MS, ACSM-CEP, Exercise Physiologist    Virtual Visit No    Medication changes reported     No    Fall or balance concerns reported    No    Tobacco Cessation No Change    Warm-up and Cool-down Performed as group-led instruction    Resistance Training Performed Yes    VAD Patient? No    PAD/SET Patient? No      Pain Assessment   Currently in Pain? No/denies    Pain Score 0-No pain    Multiple Pain Sites No           Capillary Blood Glucose: No results found for this or any previous visit (from the past 24 hour(s)).    Social History   Tobacco Use  Smoking Status Never Smoker  Smokeless Tobacco Current User  . Types: Snuff  Tobacco Comment   12/05/2013 "use snuff q now and again"    Goals Met:  Independence with exercise equipment Exercise tolerated well No report of cardiac concerns or symptoms Strength training completed today  Goals Unmet:  Not Applicable  Comments: check out @ 9:15   Dr. Kathie Dike is Medical Director for [facility].

## 2020-05-19 ENCOUNTER — Other Ambulatory Visit: Payer: Self-pay

## 2020-05-19 ENCOUNTER — Encounter (HOSPITAL_COMMUNITY)
Admission: RE | Admit: 2020-05-19 | Discharge: 2020-05-19 | Disposition: A | Discharge status: Home / Self Care | Payer: Medicare Other | Source: Ambulatory Visit | Attending: Cardiology | Admitting: Cardiology

## 2020-05-19 DIAGNOSIS — I214 Non-ST elevation (NSTEMI) myocardial infarction: Secondary | ICD-10-CM

## 2020-05-19 NOTE — Progress Notes (Signed)
Daily Session Note  Patient Details  Name: Ryan Vaughn MRN: [id number] Date of Birth: 1950/12/22 Referring Provider:    Encounter Date: 05/19/2020  Check In:  Session Check In - 05/19/20 0815      Check-In   Supervising physician immediately available to respond to emergencies [facility] immediately available    Physician(s) Harrington Challenger    Location [facility]    Staff Present Geanie Cooley, RN;Cattie Tineo Kris Mouton, MS, ACSM-CEP, Exercise Physiologist    Virtual Visit No    Medication changes reported     No    Fall or balance concerns reported    No    Tobacco Cessation No Change    Warm-up and Cool-down Performed as group-led instruction    Resistance Training Performed Yes    VAD Patient? No    PAD/SET Patient? No      Pain Assessment   Currently in Pain? No/denies    Pain Score 0-No pain    Multiple Pain Sites No           Capillary Blood Glucose: No results found for this or any previous visit (from the past 24 hour(s)).    Social History   Tobacco Use  Smoking Status Never Smoker  Smokeless Tobacco Current User  . Types: Snuff  Tobacco Comment   12/05/2013 "use snuff q now and again"    Goals Met:  Independence with exercise equipment Exercise tolerated well No report of cardiac concerns or symptoms Strength training completed today  Goals Unmet:  Not Applicable  Comments: checkout time is 0915   Dr. Kathie Dike is Medical Director for [facility].

## 2020-05-21 ENCOUNTER — Encounter (HOSPITAL_COMMUNITY): Payer: Medicare Other

## 2020-05-24 ENCOUNTER — Other Ambulatory Visit: Payer: Self-pay

## 2020-05-24 ENCOUNTER — Encounter (HOSPITAL_COMMUNITY): Payer: Medicare Other

## 2020-05-24 ENCOUNTER — Encounter (HOSPITAL_COMMUNITY)
Admission: RE | Admit: 2020-05-24 | Discharge: 2020-05-24 | Disposition: A | Discharge status: Home / Self Care | Payer: Medicare Other | Source: Ambulatory Visit | Attending: Cardiology | Admitting: Cardiology

## 2020-05-24 DIAGNOSIS — I214 Non-ST elevation (NSTEMI) myocardial infarction: Secondary | ICD-10-CM | POA: Diagnosis not present

## 2020-05-24 NOTE — Progress Notes (Signed)
Daily Session Note  Patient Details  Name: Ryan Vaughn MRN: [id number] Date of Birth: 03/23/1951 Referring Provider:    Encounter Date: 05/24/2020  Check In:  Session Check In - 05/24/20 0815      Check-In   Supervising physician immediately available to respond to emergencies [facility] immediately available    Physician(s) Dr. Nisham    Location [facility]    Staff Present Madison Karch, MS, Exercise Physiologist;Dalton Fletcher, MS, ACSM-CEP, Exercise Physiologist    Virtual Visit No    Medication changes reported     No    Fall or balance concerns reported    No    Tobacco Cessation No Change    Warm-up and Cool-down Performed as group-led instruction    Resistance Training Performed Yes    VAD Patient? No    PAD/SET Patient? No      Pain Assessment   Currently in Pain? No/denies    Multiple Pain Sites No           Capillary Blood Glucose: No results found for this or any previous visit (from the past 24 hour(s)).    Social History   Tobacco Use  Smoking Status Never Smoker  Smokeless Tobacco Current User  . Types: Snuff  Tobacco Comment   12/05/2013 "use snuff q now and again"    Goals Met:  Independence with exercise equipment Exercise tolerated well No report of cardiac concerns or symptoms Strength training completed today  Goals Unmet:  Not Applicable  Comments: check out 0915   Dr. Jehanzeb Memon is Medical Director for [place] [facility]. 

## 2020-05-26 ENCOUNTER — Other Ambulatory Visit: Payer: Self-pay

## 2020-05-26 ENCOUNTER — Encounter (HOSPITAL_COMMUNITY)
Admission: RE | Admit: 2020-05-26 | Discharge: 2020-05-26 | Disposition: A | Discharge status: Home / Self Care | Payer: Medicare Other | Source: Ambulatory Visit | Attending: Cardiology | Admitting: Cardiology

## 2020-05-26 DIAGNOSIS — I214 Non-ST elevation (NSTEMI) myocardial infarction: Secondary | ICD-10-CM

## 2020-05-26 NOTE — Progress Notes (Signed)
Daily Session Note  Patient Details  Name: Ryan Vaughn MRN: [id number] Date of Birth: 1950-09-29 Referring Provider:    Encounter Date: 05/26/2020  Check In:  Session Check In - 05/26/20 0815      Check-In   Supervising physician immediately available to respond to emergencies [facility] immediately available    Physician(s) Dr. Domenic Polite    Location [facility]    Staff Present [name], MS, Exercise Physiologist;[name], MS, ACSM-CEP, Exercise Physiologist    Virtual Visit No    Medication changes reported     No    Fall or balance concerns reported    No    Tobacco Cessation No Change    Warm-up and Cool-down Performed as group-led instruction    Resistance Training Performed Yes    VAD Patient? No    PAD/SET Patient? No      Pain Assessment   Currently in Pain? No/denies    Multiple Pain Sites No           Capillary Blood Glucose: No results found for this or any previous visit (from the past 24 hour(s)).    Social History   Tobacco Use  Smoking Status Never Smoker  Smokeless Tobacco Current User  . Types: Snuff  Tobacco Comment   12/05/2013 "use snuff q now and again"    Goals Met:  Independence with exercise equipment Exercise tolerated well No report of cardiac concerns or symptoms Strength training completed today  Goals Unmet:  Not Applicable  Comments: check out 0915   Dr. Kathie Dike is Medical Director for [facility].

## 2020-05-28 ENCOUNTER — Encounter (HOSPITAL_COMMUNITY)
Admission: RE | Admit: 2020-05-28 | Discharge: 2020-05-28 | Disposition: A | Discharge status: Home / Self Care | Payer: Medicare Other | Source: Ambulatory Visit | Attending: Cardiology | Admitting: Cardiology

## 2020-05-28 ENCOUNTER — Other Ambulatory Visit: Payer: Self-pay

## 2020-05-28 DIAGNOSIS — I214 Non-ST elevation (NSTEMI) myocardial infarction: Secondary | ICD-10-CM | POA: Diagnosis not present

## 2020-05-28 NOTE — Progress Notes (Signed)
Daily Session Note  Patient Details  Name: Ryan Vaughn MRN: [id number] Date of Birth: [birth year] Referring Provider:    Encounter Date: 05/28/2020  Check In:  Session Check In - 05/28/20 0815      Check-In   Supervising physician immediately available to respond to emergencies [facility] immediately available    Physician(s) Dr. Harl Bowie    Location [facility]    Staff Present Aundra Dubin, RN, BSN;Hailei Besser Audria Nine, MS, Exercise Physiologist    Virtual Visit No    Medication changes reported     No    Fall or balance concerns reported    No    Tobacco Cessation No Change    Warm-up and Cool-down Performed as group-led instruction    Resistance Training Performed Yes    VAD Patient? No    PAD/SET Patient? No      Pain Assessment   Currently in Pain? No/denies    Multiple Pain Sites No           Capillary Blood Glucose: No results found for this or any previous visit (from the past 24 hour(s)).    Social History   Tobacco Use  Smoking Status Never Smoker  Smokeless Tobacco Current User  . Types: Snuff  Tobacco Comment   12/05/2013 "use snuff q now and again"    Goals Met:  Independence with exercise equipment Exercise tolerated well No report of cardiac concerns or symptoms Strength training completed today  Goals Unmet:  Not Applicable  Comments: check out 0915   Dr. Kathie Dike is Medical Director for [facility].

## 2020-05-31 ENCOUNTER — Other Ambulatory Visit: Payer: Self-pay

## 2020-05-31 ENCOUNTER — Encounter (HOSPITAL_COMMUNITY)
Admission: RE | Admit: 2020-05-31 | Discharge: 2020-05-31 | Disposition: A | Discharge status: Home / Self Care | Payer: Medicare Other | Source: Ambulatory Visit | Attending: Cardiology | Admitting: Cardiology

## 2020-05-31 VITALS — Wt 223.1 lb

## 2020-05-31 DIAGNOSIS — I214 Non-ST elevation (NSTEMI) myocardial infarction: Secondary | ICD-10-CM | POA: Diagnosis not present

## 2020-05-31 NOTE — Progress Notes (Signed)
Daily Session Note  Patient Details  Name: HAKEEN SHIPES MRN: [id number] Date of Birth: 26-Apr-1951 Referring Provider:    Encounter Date: 05/31/2020  Check In:  Session Check In - 05/31/20 0815      Check-In   Supervising physician immediately available to respond to emergencies [facility] MD immediately available    Physician(s) Dr. Harl Bowie    Location [facility]    Staff Present Aundra Dubin, RN, Bjorn Loser, MS, ACSM-CEP, Exercise Physiologist    Virtual Visit No    Medication changes reported     No    Fall or balance concerns reported    No    Tobacco Cessation No Change    Warm-up and Cool-down Performed as group-led instruction    Resistance Training Performed Yes    VAD Patient? No    PAD/SET Patient? No      Pain Assessment   Currently in Pain? No/denies    Pain Score 0-No pain    Multiple Pain Sites No           Capillary Blood Glucose: No results found for this or any previous visit (from the past 24 hour(s)).    Social History   Tobacco Use  Smoking Status Never Smoker  Smokeless Tobacco Current User  . Types: Snuff  Tobacco Comment   12/05/2013 "use snuff q now and again"    Goals Met:  Independence with exercise equipment Exercise tolerated well No report of cardiac concerns or symptoms Strength training completed today  Goals Unmet:  Not Applicable  Comments: checkout time is 0915   Dr. Kathie Dike is Medical Director for [facility].

## 2020-06-02 ENCOUNTER — Encounter (HOSPITAL_COMMUNITY)
Admission: RE | Admit: 2020-06-02 | Discharge: 2020-06-02 | Disposition: A | Discharge status: Home / Self Care | Payer: Medicare Other | Source: Ambulatory Visit | Attending: Cardiology | Admitting: Cardiology

## 2020-06-02 ENCOUNTER — Other Ambulatory Visit: Payer: Self-pay

## 2020-06-02 DIAGNOSIS — I214 Non-ST elevation (NSTEMI) myocardial infarction: Secondary | ICD-10-CM | POA: Diagnosis not present

## 2020-06-02 NOTE — Progress Notes (Signed)
Daily Session Note  Patient Details  Name: Ryan Vaughn MRN: [id number] Date of Birth: 1950-12-16 Referring Provider:    Encounter Date: 06/02/2020  Check In:  Session Check In - 06/02/20 0815      Check-In   Supervising physician immediately available to respond to emergencies [facility] MD immediately available    Physician(s) Dr. Harl Bowie    Location [facility]    Staff Present [name], MS, Exercise Physiologist;[name], MS, ACSM-CEP, Exercise Physiologist    Virtual Visit No    Medication changes reported     No    Fall or balance concerns reported    No    Tobacco Cessation No Change    Warm-up and Cool-down Performed as group-led instruction    Resistance Training Performed Yes    VAD Patient? No    PAD/SET Patient? No      Pain Assessment   Currently in Pain? No/denies    Pain Score 0-No pain    Multiple Pain Sites No           Capillary Blood Glucose: No results found for this or any previous visit (from the past 24 hour(s)).    Social History   Tobacco Use  Smoking Status Never Smoker  Smokeless Tobacco Current User  . Types: Snuff  Tobacco Comment   12/05/2013 "use snuff q now and again"    Goals Met:  Independence with exercise equipment Exercise tolerated well No report of cardiac concerns or symptoms Strength training completed today  Goals Unmet:  Not Applicable  Comments: check out 0915   Dr. Kathie Dike is Medical Director for [facility].

## 2020-06-02 NOTE — Progress Notes (Signed)
Cardiac Individual Treatment Plan  Patient Details  Name: Ryan Vaughn MRN: [id number] Date of Birth: 1950/09/08 Referring Provider:    Initial Encounter Date:  [place] from 04/14/2020 in [place]  Date 04/14/20      Visit Diagnosis: NSTEMI (non-ST elevated myocardial infarction) [facility])  Patient's Home Medications on Admission:  Current Outpatient Medications:  .  aspirin EC 81 MG EC tablet, Take 1 tablet (81 mg total) by mouth daily. Swallow whole., Disp: 30 tablet, Rfl: 11 .  clopidogrel (PLAVIX) 75 MG tablet, Take 1 tablet (75 mg total) by mouth daily., Disp: 30 tablet, Rfl: 6 .  metFORMIN (GLUCOPHAGE) 500 MG tablet, Take 500 mg by mouth 2 (two) times daily with a meal., Disp: , Rfl:  .  metoprolol succinate (TOPROL-XL) 50 MG 24 hr tablet, Take 50 mg by mouth daily., Disp: , Rfl:  .  nitroGLYCERIN (NITROSTAT) 0.4 MG SL tablet, Place 1 tablet (0.4 mg total) under the tongue every 5 (five) minutes x 3 doses as needed for chest pain., Disp: 25 tablet, Rfl: 3 .  ranolazine (RANEXA) 500 MG 12 hr tablet, Take 1 tablet (500 mg total) by mouth 2 (two) times daily., Disp: 60 tablet, Rfl: 6 .  rosuvastatin (CRESTOR) 40 MG tablet, TAKE 1 TABLET(40 MG) BY MOUTH DAILY, Disp: 90 tablet, Rfl: 1  Past Medical History: Past Medical History:  Diagnosis Date  . Arthritis   . CAD (coronary artery disease)    a. DES to OM 1 and DES mLCX (12/05/13) b. CABG x1  ('92) c. 6 stents placed in 2009? (pt hx)   . Diabetes mellitus without complication ([place])    type 2  . Essential hypertension, benign   . Heart murmur    as a child   . History of kidney stones   . Lumbago   . Mixed hyperlipidemia   . Myocardial infarction [facility]) age 69  . Non Hodgkin's lymphoma ([place]) 2002   a. stage IV, chemo done  . Personal history of colonic polyps     Tobacco Use: Social History   Tobacco Use  Smoking Status Never Smoker  Smokeless Tobacco  Current User  . Types: Snuff  Tobacco Comment   12/05/2013 "use snuff q now and again"    Labs: Recent Review Flowsheet Data    Labs for ITP Cardiac and Pulmonary Rehab Latest Ref Rng & Units 07/15/2015 12/15/2018 10/01/2019 12/18/2019 12/19/2019   Cholestrol 0 - 200 mg/dL 148 - - - 104   LDLCALC 0 - 99 mg/dL UNABLE TO CALCULATE IF TRIGLYCERIDE OVER 400 mg/dL - - - 14   HDL >40 mg/dL 28(L) - - - 25(L)   Trlycerides <150 mg/dL 473(H) - - - 326(H)   Hemoglobin A1c 4.8 - 5.6 % - 6.8(H) - 6.8(H) -   TCO2 22 - 32 mmol/L - - 30 - -      Capillary Blood Glucose: Lab Results  Component Value Date   GLUCAP 96 04/14/2020   GLUCAP 79 04/14/2020   GLUCAP 126 (H) 12/20/2019   GLUCAP 281 (H) 12/20/2019   GLUCAP 192 (H) 12/20/2019     Exercise Target Goals: Exercise Program Goal: Individual exercise prescription set using results from initial 6 min walk test and THRR while considering  patient's activity barriers and safety.   Exercise Prescription Goal: Starting with aerobic activity 30 plus minutes a day, 3 days per week for initial exercise prescription. Provide home exercise prescription and guidelines that  participant acknowledges understanding prior to discharge.  Activity Barriers & Risk Stratification:  Activity Barriers & Cardiac Risk Stratification - 05/04/20 1018      Activity Barriers & Cardiac Risk Stratification   Activity Barriers Arthritis;Back Problems    Cardiac Risk Stratification High           6 Minute Walk:  6 Minute Walk    Row Name 04/14/20 1416         6 Minute Walk   Phase Initial     Distance 1400 feet     Walk Time 6 minutes     # of Rest Breaks 0     MPH 2.96     METS 2.7     RPE 9     VO2 Peak 10.37     Symptoms Yes (comment)     Comments Pt was experiencing chest tightness     Resting HR 69 bpm     Resting BP 118/72     Resting Oxygen Saturation  97 %     Max Ex. HR 84 bpm     Max Ex. BP 138/74     2 Minute Post BP 118/72             Oxygen Initial Assessment:   Oxygen Re-Evaluation:   Oxygen Discharge (Final Oxygen Re-Evaluation):   Initial Exercise Prescription:  Initial Exercise Prescription - 04/14/20 1400      Date of Initial Exercise RX and Referring Provider   Date 04/14/20    Expected Discharge Date 07/07/20      Treadmill   MPH 2    Grade 0    Minutes 17      Recumbant Elliptical   Level 1    RPM 60    Minutes 22      Prescription Details   Duration Progress to 30 minutes of continuous aerobic without signs/symptoms of physical distress      Intensity   THRR 40-80% of Max Heartrate 61-122    Ratings of Perceived Exertion 11-13      Progression   Progression Continue progressive overload as per policy without signs/symptoms or physical distress.      Resistance Training   Training Prescription Yes    Weight 3    Reps 10-15           Perform Capillary Blood Glucose checks as needed.  Exercise Prescription Changes:   Exercise Prescription Changes    Row Name 05/03/20 1100 05/17/20 0800 05/31/20 1200         Response to Exercise   Blood Pressure (Admit) 100/58 96/64 104/60     Blood Pressure (Exercise) 122/70 138/68 126/64     Blood Pressure (Exit) 110/72 98/58 96/62      Heart Rate (Admit) 68 bpm 58 bpm 68 bpm     Heart Rate (Exercise) 97 bpm 107 bpm 106 bpm     Heart Rate (Exit) 77 bpm 70 bpm 76 bpm     Rating of Perceived Exertion (Exercise) 11 11 12      Duration Continue with 30 min of aerobic exercise without signs/symptoms of physical distress. Continue with 30 min of aerobic exercise without signs/symptoms of physical distress. Continue with 30 min of aerobic exercise without signs/symptoms of physical distress.     Intensity THRR unchanged THRR unchanged THRR unchanged           Progression   Progression Continue to progress workloads to maintain intensity without signs/symptoms of physical distress. Continue to progress workloads  to maintain intensity without  signs/symptoms of physical distress. Continue to progress workloads to maintain intensity without signs/symptoms of physical distress.           Resistance Training   Training Prescription Yes Yes Yes     Weight 3 lbs 4 lbs 4 lbs     Reps 10-15 10-15 10-15     Time 10 Minutes 10 Minutes 10 Minutes           Treadmill   MPH 2.1 2.2 2.3     Grade 0 0.5 1.5     Minutes 17 17 17      METs 2.61 2.82 3.22           Recumbant Elliptical   Level 1 1 2      RPM 71 73 85     Minutes 22 22 22      METs 4 4 5             Exercise Comments:   Exercise Comments    Row Name 04/28/20 0939           Exercise Comments Pt completed first exercise session. He tolerated exercise well and enjoyed his first day at rehab.              Exercise Goals and Review:   Exercise Goals    Row Name 04/14/20 1433 05/03/20 1159 05/31/20 1215         Exercise Goals   Increase Physical Activity Yes Yes Yes     Intervention Provide advice, education, support and counseling about physical activity/exercise needs.;Develop an individualized exercise prescription for aerobic and resistive training based on initial evaluation findings, risk stratification, comorbidities and participant's personal goals. Provide advice, education, support and counseling about physical activity/exercise needs.;Develop an individualized exercise prescription for aerobic and resistive training based on initial evaluation findings, risk stratification, comorbidities and participant's personal goals. Provide advice, education, support and counseling about physical activity/exercise needs.;Develop an individualized exercise prescription for aerobic and resistive training based on initial evaluation findings, risk stratification, comorbidities and participant's personal goals.     Expected Outcomes Short Term: Attend rehab on a regular basis to increase amount of physical activity.;Long Term: Add in home exercise to make exercise part  of routine and to increase amount of physical activity.;Long Term: Exercising regularly at least 3-5 days a week. Short Term: Attend rehab on a regular basis to increase amount of physical activity.;Long Term: Add in home exercise to make exercise part of routine and to increase amount of physical activity.;Long Term: Exercising regularly at least 3-5 days a week. Short Term: Attend rehab on a regular basis to increase amount of physical activity.;Long Term: Add in home exercise to make exercise part of routine and to increase amount of physical activity.;Long Term: Exercising regularly at least 3-5 days a week.     Increase Strength and Stamina Yes Yes Yes     Intervention Provide advice, education, support and counseling about physical activity/exercise needs.;Develop an individualized exercise prescription for aerobic and resistive training based on initial evaluation findings, risk stratification, comorbidities and participant's personal goals. Provide advice, education, support and counseling about physical activity/exercise needs.;Develop an individualized exercise prescription for aerobic and resistive training based on initial evaluation findings, risk stratification, comorbidities and participant's personal goals. Provide advice, education, support and counseling about physical activity/exercise needs.;Develop an individualized exercise prescription for aerobic and resistive training based on initial evaluation findings, risk stratification, comorbidities and participant's personal goals.     Expected Outcomes Short Term:  Increase workloads from initial exercise prescription for resistance, speed, and METs.;Short Term: Perform resistance training exercises routinely during rehab and add in resistance training at home;Long Term: Improve cardiorespiratory fitness, muscular endurance and strength as measured by increased METs and functional capacity (6MWT) Short Term: Increase workloads from initial  exercise prescription for resistance, speed, and METs.;Short Term: Perform resistance training exercises routinely during rehab and add in resistance training at home;Long Term: Improve cardiorespiratory fitness, muscular endurance and strength as measured by increased METs and functional capacity (6MWT) Short Term: Increase workloads from initial exercise prescription for resistance, speed, and METs.;Short Term: Perform resistance training exercises routinely during rehab and add in resistance training at home;Long Term: Improve cardiorespiratory fitness, muscular endurance and strength as measured by increased METs and functional capacity (6MWT)     Able to understand and use rate of perceived exertion (RPE) scale Yes Yes Yes     Intervention Provide education and explanation on how to use RPE scale Provide education and explanation on how to use RPE scale Provide education and explanation on how to use RPE scale     Expected Outcomes Short Term: Able to use RPE daily in rehab to express subjective intensity level;Long Term:  Able to use RPE to guide intensity level when exercising independently Short Term: Able to use RPE daily in rehab to express subjective intensity level;Long Term:  Able to use RPE to guide intensity level when exercising independently Short Term: Able to use RPE daily in rehab to express subjective intensity level;Long Term:  Able to use RPE to guide intensity level when exercising independently     Knowledge and understanding of Target Heart Rate Range (THRR) Yes Yes Yes     Intervention Provide education and explanation of THRR including how the numbers were predicted and where they are located for reference Provide education and explanation of THRR including how the numbers were predicted and where they are located for reference Provide education and explanation of THRR including how the numbers were predicted and where they are located for reference     Expected Outcomes Short Term:  Able to state/look up THRR;Long Term: Able to use THRR to govern intensity when exercising independently;Short Term: Able to use daily as guideline for intensity in rehab Short Term: Able to state/look up THRR;Long Term: Able to use THRR to govern intensity when exercising independently;Short Term: Able to use daily as guideline for intensity in rehab Short Term: Able to state/look up THRR;Long Term: Able to use THRR to govern intensity when exercising independently;Short Term: Able to use daily as guideline for intensity in rehab     Able to check pulse independently Yes Yes Yes     Intervention Provide education and demonstration on how to check pulse in carotid and radial arteries.;Review the importance of being able to check your own pulse for safety during independent exercise Provide education and demonstration on how to check pulse in carotid and radial arteries.;Review the importance of being able to check your own pulse for safety during independent exercise Provide education and demonstration on how to check pulse in carotid and radial arteries.;Review the importance of being able to check your own pulse for safety during independent exercise     Expected Outcomes Short Term: Able to explain why pulse checking is important during independent exercise;Long Term: Able to check pulse independently and accurately Short Term: Able to explain why pulse checking is important during independent exercise;Long Term: Able to check pulse independently and accurately Short Term:  Able to explain why pulse checking is important during independent exercise;Long Term: Able to check pulse independently and accurately     Understanding of Exercise Prescription Yes Yes Yes     Intervention Provide education, explanation, and written materials on patient's individual exercise prescription Provide education, explanation, and written materials on patient's individual exercise prescription Provide education, explanation, and  written materials on patient's individual exercise prescription     Expected Outcomes Short Term: Able to explain program exercise prescription;Long Term: Able to explain home exercise prescription to exercise independently Short Term: Able to explain program exercise prescription;Long Term: Able to explain home exercise prescription to exercise independently Short Term: Able to explain program exercise prescription;Long Term: Able to explain home exercise prescription to exercise independently            Exercise Goals Re-Evaluation :  Exercise Goals Re-Evaluation    Row Name 05/03/20 1200 05/31/20 1216           Exercise Goal Re-Evaluation   Exercise Goals Review Increase Physical Activity;Increase Strength and Stamina;Able to understand and use rate of perceived exertion (RPE) scale;Knowledge and understanding of Target Heart Rate Range (THRR);Able to check pulse independently;Understanding of Exercise Prescription Increase Physical Activity;Increase Strength and Stamina;Able to understand and use rate of perceived exertion (RPE) scale;Knowledge and understanding of Target Heart Rate Range (THRR);Able to check pulse independently;Understanding of Exercise Prescription      Comments Pt has attended 3 exercise sessions. He is motivated to exercise and shows a motivated outlook. He currently exercises at 4.0 METs on the elliptical. Will continue to monitor and progress as able. Pt has attended 13 exercise sessions. He is progressing quickly and is continuously increasing his workloads. He is highly motivated and has been walking at home on his off days from rehab. He crrently exercises at 5.0 METs on the elliptical. Will continue to monitor and progress as able.      Expected Outcomes Through exercise at rehab and by engaging in a home exercise program the patient will reach their goals. Through exercise at rehab and by engaging in a home exercise program the patient will reach their goals.               Discharge Exercise Prescription (Final Exercise Prescription Changes):  Exercise Prescription Changes - 05/31/20 1200      Response to Exercise   Blood Pressure (Admit) 104/60    Blood Pressure (Exercise) 126/64    Blood Pressure (Exit) 96/62    Heart Rate (Admit) 68 bpm    Heart Rate (Exercise) 106 bpm    Heart Rate (Exit) 76 bpm    Rating of Perceived Exertion (Exercise) 12    Duration Continue with 30 min of aerobic exercise without signs/symptoms of physical distress.    Intensity THRR unchanged      Progression   Progression Continue to progress workloads to maintain intensity without signs/symptoms of physical distress.      Resistance Training   Training Prescription Yes    Weight 4 lbs    Reps 10-15    Time 10 Minutes      Treadmill   MPH 2.3    Grade 1.5    Minutes 17    METs 3.22      Recumbant Elliptical   Level 2    RPM 85    Minutes 22    METs 5           Nutrition:  Target Goals: Understanding of nutrition guidelines, daily intake  of sodium 1500mg , cholesterol 200mg , calories 30% from fat and 7% or less from saturated fats, daily to have 5 or more servings of fruits and vegetables.  Biometrics:  Pre Biometrics - 04/14/20 1434      Pre Biometrics   Height 5\' 11"  (1.803 m)    Weight 100.9 kg    Waist Circumference 43.5 inches    Hip Circumference 44 inches    Waist to Hip Ratio 0.99 %    BMI (Calculated) 31.03    Triceps Skinfold 11 mm    % Body Fat 29.1 %    Grip Strength 42 kg    Flexibility 13 in    Single Leg Stand 60 seconds   60 seconds           Nutrition Therapy Plan and Nutrition Goals:  Nutrition Therapy & Goals - 05/26/20 0903      Personal Nutrition Goals   Comments We will continue to provide education through handouts.      Intervention Plan   Intervention Nutrition handout(s) given to patient.           Nutrition Assessments:  Nutrition Assessments - 04/14/20 1415      MEDFICTS Scores   Pre Score  59          MEDIFICTS Score Key:  ?70 Need to make dietary changes   40-70 Heart Healthy Diet  ? 40 Therapeutic Level Cholesterol Diet   Picture Your Plate Scores:  [id number] Unhealthy dietary pattern with much room for improvement.  41-50 Dietary pattern unlikely to meet recommendations for good health and room for improvement.  51-60 More healthful dietary pattern, with some room for improvement.   >60 Healthy dietary pattern, although there may be some specific behaviors that could be improved.    Nutrition Goals Re-Evaluation:   Nutrition Goals Discharge (Final Nutrition Goals Re-Evaluation):   Psychosocial: Target Goals: Acknowledge presence or absence of significant depression and/or stress, maximize coping skills, provide positive support system. Participant is able to verbalize types and ability to use techniques and skills needed for reducing stress and depression.  Initial Review & Psychosocial Screening:  Initial Psych Review & Screening - 04/14/20 1411      Initial Review   Current issues with None Identified      Family Dynamics   Good Support System? Yes    Comments Patient lives with his girlfriend. He says she and other friends are his support system. He has a positive outlook about his future. He denies any depression or anxiety. Will continue to monitor.      Barriers   Psychosocial barriers to participate in program There are no identifiable barriers or psychosocial needs.      Screening Interventions   Interventions Encouraged to exercise    Expected Outcomes Short Term goal: Utilizing psychosocial counselor, staff and physician to assist with identification of specific Stressors or current issues interfering with healing process. Setting desired goal for each stressor or current issue identified.;Short Term goal: Identification and review with participant of any Quality of Life or Depression concerns found by scoring the questionnaire.            Quality of Life Scores:  Quality of Life - 04/14/20 1414      Quality of Life   Select Quality of Life      Quality of Life Scores   Health/Function Pre 23.6 %    Socioeconomic Pre 28.07 %    Psych/Spiritual Pre 30 %    Family  Pre 26.4 %    GLOBAL Pre 26.25 %          Scores of 19 and below usually indicate a poorer quality of life in these areas.  A difference of  2-3 points is a clinically meaningful difference.  A difference of 2-3 points in the total score of the Quality of Life Index has been associated with significant improvement in overall quality of life, self-image, physical symptoms, and general health in studies assessing change in quality of life.  PHQ-9: Recent Review Flowsheet Data    Depression screen [facility] 2/9 04/14/2020 01/20/2014   Decreased Interest 0 0   Down, Depressed, Hopeless 0 0   PHQ - 2 Score 0 0   Altered sleeping 0 -   Tired, decreased energy 1 -   Change in appetite 0 -   Feeling bad or failure about yourself  0 -   Trouble concentrating 0 -   Moving slowly or fidgety/restless 0 -   Suicidal thoughts 0 -   PHQ-9 Score 1 -   Difficult doing work/chores Not difficult at all -     Interpretation of Total Score  Total Score Depression Severity:  1-4 = Minimal depression, 5-9 = Mild depression, 10-14 = Moderate depression, 15-19 = Moderately severe depression, 20-27 = Severe depression   Psychosocial Evaluation and Intervention:  Psychosocial Evaluation - 04/14/20 1413      Psychosocial Evaluation & Interventions   Interventions Stress management education;Relaxation education;Encouraged to exercise with the program and follow exercise prescription    Comments Patient has no psychosocial issues identified at his orientation visit. His inital QOL was 26.25 overall and his PHQ-9 socre was 1. Will continue to monitor.    Expected Outcomes Patient will have no psychosocial issues identified at discharge.    Continue Psychosocial Services  No  Follow up required           Psychosocial Re-Evaluation:  Psychosocial Re-Evaluation    [place] Name 05/26/20 0903             Psychosocial Re-Evaluation   Current issues with None Identified       Comments Patient continues to have no psychosocial issues identified. He has completed 11 sessions with consistent attendance. He continues to demonstrate a positive attitude regarding his future and is very interactive with staff and other patients in his class. Will continue to monitor.       Expected Outcomes Patient will have no psychosocial issues identifed at discharge.       Interventions Relaxation education;Stress management education;Encouraged to attend Cardiac Rehabilitation for the exercise       Continue Psychosocial Services  No Follow up required              Psychosocial Discharge (Final Psychosocial Re-Evaluation):  Psychosocial Re-Evaluation - 05/26/20 0903      Psychosocial Re-Evaluation   Current issues with None Identified    Comments Patient continues to have no psychosocial issues identified. He has completed 11 sessions with consistent attendance. He continues to demonstrate a positive attitude regarding his future and is very interactive with staff and other patients in his class. Will continue to monitor.    Expected Outcomes Patient will have no psychosocial issues identifed at discharge.    Interventions Relaxation education;Stress management education;Encouraged to attend Cardiac Rehabilitation for the exercise    Continue Psychosocial Services  No Follow up required           Vocational Rehabilitation: Provide vocational rehab assistance to  qualifying candidates.   Vocational Rehab Evaluation & Intervention:  Vocational Rehab - 04/14/20 1415      Initial Vocational Rehab Evaluation & Intervention   Assessment shows need for Vocational Rehabilitation No      Vocational Rehab Re-Evaulation   Comments Patient says he would like to get a part time job  after he finishes rehab but is not interested in vocational rehab.           Education: Education Goals: Education classes will be provided on a weekly basis, covering required topics. Participant will state understanding/return demonstration of topics presented.  Learning Barriers/Preferences:  Learning Barriers/Preferences - 04/14/20 1415      Learning Barriers/Preferences   Learning Barriers None    Learning Preferences Audio           Education Topics: Hypertension, Hypertension Reduction -Define heart disease and high blood pressure. Discus how high blood pressure affects the body and ways to reduce high blood pressure. Flowsheet Row CARDIAC REHAB PHASE II EXERCISE from 05/26/2020 in [place]  Date 05/26/20  Educator [facility]  Instruction Review Code 2- Demonstrated Understanding      Exercise and Your Heart -Discuss why it is important to exercise, the FITT principles of exercise, normal and abnormal responses to exercise, and how to exercise safely.   Angina -Discuss definition of angina, causes of angina, treatment of angina, and how to decrease risk of having angina.   Cardiac Medications -Review what the following cardiac medications are used for, how they affect the body, and side effects that may occur when taking the medications.  Medications include Aspirin, Beta blockers, calcium channel blockers, ACE Inhibitors, angiotensin receptor blockers, diuretics, digoxin, and antihyperlipidemics.   Congestive Heart Failure -Discuss the definition of CHF, how to live with CHF, the signs and symptoms of CHF, and how keep track of weight and sodium intake.   Heart Disease and Intimacy -Discus the effect sexual activity has on the heart, how changes occur during intimacy as we age, and safety during sexual activity.   Smoking Cessation / COPD -Discuss different methods to quit smoking, the health benefits of quitting smoking, and the definition of  COPD.   Nutrition I: Fats -Discuss the types of cholesterol, what cholesterol does to the heart, and how cholesterol levels can be controlled.   Nutrition II: Labels -Discuss the different components of food labels and how to read food label   Heart Parts/Heart Disease and PAD -Discuss the anatomy of the heart, the pathway of blood circulation through the heart, and these are affected by heart disease.   Stress I: Signs and Symptoms -Discuss the causes of stress, how stress may lead to anxiety and depression, and ways to limit stress. Flowsheet Row CARDIAC REHAB PHASE II EXERCISE from 05/26/2020 in [place]  Date 05/05/20  Educator [facility]  Instruction Review Code 2- Demonstrated Understanding      Stress II: Relaxation -Discuss different types of relaxation techniques to limit stress. Flowsheet Row CARDIAC REHAB PHASE II EXERCISE from 05/26/2020 in [place]  Date 05/12/20  Educator Mk  Instruction Review Code 2- Demonstrated Understanding      Warning Signs of Stroke / TIA -Discuss definition of a stroke, what the signs and symptoms are of a stroke, and how to identify when someone is having stroke. Flowsheet Row CARDIAC REHAB PHASE II EXERCISE from 05/26/2020 in [place]  Date 05/19/20  Educator DF  Instruction Review Code 2- Demonstrated Understanding  Knowledge Questionnaire Score:  Knowledge Questionnaire Score - 04/14/20 1415      Knowledge Questionnaire Score   Pre Score 23/28           Core Components/Risk Factors/Patient Goals at Admission:  Personal Goals and Risk Factors at Admission - 04/14/20 1416      Core Components/Risk Factors/Patient Goals on Admission    Weight Management Yes    Intervention Weight Management: Provide education and appropriate resources to help participant work on and attain dietary goals.;Weight Management/Obesity: Establish reasonable short term and long  term weight goals.    Admit Weight 223 lb 14.4 oz (101.6 kg)    Goal Weight: Short Term 218 lb 14.4 oz (99.3 kg)    Goal Weight: Long Term 213 lb 9.6 oz (96.9 kg)    Expected Outcomes Long Term: Adherence to nutrition and physical activity/exercise program aimed toward attainment of established weight goal    Diabetes Yes    Intervention Provide education about signs/symptoms and action to take for hypo/hyperglycemia.;Provide education about proper nutrition, including hydration, and aerobic/resistive exercise prescription along with prescribed medications to achieve blood glucose in normal ranges: Fasting glucose 65-99 mg/dL    Expected Outcomes Short Term: Participant verbalizes understanding of the signs/symptoms and immediate care of hyper/hypoglycemia, proper foot care and importance of medication, aerobic/resistive exercise and nutrition plan for blood glucose control.;Long Term: Attainment of HbA1C < 7%.    Personal Goal Other Yes    Personal Goal Lose 15 to 20 lbs. Increase energy. Complete the program.    Intervention Patient will attend CR 3 days/week and supplement with exercise at home 2 days/week.    Expected Outcomes Patient will meet both program and personal goals.           Core Components/Risk Factors/Patient Goals Review:   Goals and Risk Factor Review    Row Name 04/28/20 0745 05/26/20 0918           Core Components/Risk Factors/Patient Goals Review   Personal Goals Review Weight Management/Obesity;Diabetes Weight Management/Obesity;Diabetes      Review Patient has no started the program. He has been exposed to [place] and has been in [place]. He is scheduled to start today. His goals are to lose 15 to 20 lbs; increase energy and complete the program. Will continue to monitor for progress. Patient has completed 11 sessions maintaining his weight since his initial visit. He is doing well in the program with progression and consistent attendance. He did not get COVID due  to his exposure. He last A1C on file was 12/18/19 at 6.8%. He does not monitor his glucose at home. His blood pressures are WNL all three readings. His personal goals are to lose 15-20 lbs; increase energy; and complete the program. Will continue to monitor for progress.      Expected Outcomes Patient will complete the program meeting both program and personal goals. Patient will complete the program meeting both program and personal goals.             Core Components/Risk Factors/Patient Goals at Discharge (Final Review):   Goals and Risk Factor Review - 05/26/20 0918      Core Components/Risk Factors/Patient Goals Review   Personal Goals Review Weight Management/Obesity;Diabetes    Review Patient has completed 11 sessions maintaining his weight since his initial visit. He is doing well in the program with progression and consistent attendance. He did not get COVID due to his exposure. He last A1C on file was 12/18/19 at 6.8%. He  does not monitor his glucose at home. His blood pressures are WNL all three readings. His personal goals are to lose 15-20 lbs; increase energy; and complete the program. Will continue to monitor for progress.    Expected Outcomes Patient will complete the program meeting both program and personal goals.           ITP Comments:   Comments: ITP REVIEW Pt is making expected progress toward Cardiac Rehab goals after completing 14 sessions. Recommend continued exercise, life style modification, education, and increased stamina and strength.

## 2020-06-02 NOTE — Progress Notes (Signed)
I have reviewed a Home Exercise Prescription with Ryan Vaughn . Ryan Vaughn is not currently exercising at home. He is doing yard work pretty frequently and was counting that as exercise at home. The patient was advised to walk 2-3 days a week for 30-45 minutes outside of rehab.  Ryan Vaughn and I discussed how to progress their exercise prescription.  The patient stated that their goals were to get back to optimal health, get stronger and improve ADL's.  The patient stated that they understand the exercise prescription.  We reviewed exercise guidelines, target heart rate during exercise, RPE Scale, weather conditions, NTG use, endpoints for exercise, warmup and cool down.  Patient is encouraged to come to me with any questions. I will continue to follow up with the patient to assist them with progression and safety.

## 2020-06-04 ENCOUNTER — Other Ambulatory Visit: Payer: Self-pay

## 2020-06-04 ENCOUNTER — Encounter (HOSPITAL_COMMUNITY)
Admission: RE | Admit: 2020-06-04 | Discharge: 2020-06-04 | Disposition: A | Discharge status: Home / Self Care | Payer: Medicare Other | Source: Ambulatory Visit | Attending: Cardiology | Admitting: Cardiology

## 2020-06-04 DIAGNOSIS — I214 Non-ST elevation (NSTEMI) myocardial infarction: Secondary | ICD-10-CM

## 2020-06-04 NOTE — Progress Notes (Signed)
Daily Session Note  Patient Details  Name: Ryan Vaughn MRN: [id number] Date of Birth: 04-06-1951 Referring Provider:    Encounter Date: 06/04/2020  Check In:  Session Check In - 06/04/20 0815      Check-In   Supervising physician immediately available to respond to emergencies [facility] MD immediately available    Physician(s) Dr. Harl Bowie    Location [facility]    Staff Present [name], MS, Exercise Physiologist;[name], MS, ACSM-CEP, Exercise Physiologist    Virtual Visit No    Medication changes reported     No    Fall or balance concerns reported    No    Tobacco Cessation No Change    Warm-up and Cool-down Performed as group-led instruction    Resistance Training Performed Yes    VAD Patient? No    PAD/SET Patient? No      Pain Assessment   Currently in Pain? No/denies    Pain Score 0-No pain    Multiple Pain Sites No           Capillary Blood Glucose: No results found for this or any previous visit (from the past 24 hour(s)).    Social History   Tobacco Use  Smoking Status Never Smoker  Smokeless Tobacco Current User  . Types: Snuff  Tobacco Comment   12/05/2013 "use snuff q now and again"    Goals Met:  Independence with exercise equipment Exercise tolerated well No report of cardiac concerns or symptoms Strength training completed today  Goals Unmet:  Not Applicable  Comments: check out 0915   Dr. Kathie Dike is Medical Director for [facility] Pul[facility] Re[facility].

## 2020-06-07 ENCOUNTER — Encounter (HOSPITAL_COMMUNITY): Payer: Medicare Other

## 2020-06-09 ENCOUNTER — Other Ambulatory Visit: Payer: Self-pay | Admitting: Physician Assistant

## 2020-06-09 ENCOUNTER — Encounter (HOSPITAL_COMMUNITY)
Admission: RE | Admit: 2020-06-09 | Discharge: 2020-06-09 | Disposition: A | Discharge status: Home / Self Care | Payer: Medicare Other | Source: Ambulatory Visit | Attending: Cardiology | Admitting: Cardiology

## 2020-06-09 ENCOUNTER — Other Ambulatory Visit: Payer: Self-pay

## 2020-06-09 DIAGNOSIS — I214 Non-ST elevation (NSTEMI) myocardial infarction: Secondary | ICD-10-CM | POA: Diagnosis not present

## 2020-06-09 NOTE — Progress Notes (Signed)
Daily Session Note  Patient Details  Name: Ryan Vaughn MRN: [id number] Date of Birth: 1950-06-14 Referring Provider:    Encounter Date: 06/09/2020  Check In:  Session Check In - 06/09/20 0815      Check-In   Supervising physician immediately available to respond to emergencies [facility] MD immediately available    Physician(s) Dr. Domenic Polite    Location [facility]    Staff Present [name], MS, Exercise Physiologist;[name], MS, ACSM-CEP, Exercise Physiologist    Virtual Visit No    Medication changes reported     No    Fall or balance concerns reported    No    Tobacco Cessation No Change    Warm-up and Cool-down Performed as group-led instruction    Resistance Training Performed Yes    VAD Patient? No    PAD/SET Patient? No      Pain Assessment   Currently in Pain? No/denies    Pain Score 0-No pain    Multiple Pain Sites No           Capillary Blood Glucose: No results found for this or any previous visit (from the past 24 hour(s)).    Social History   Tobacco Use  Smoking Status Never Smoker  Smokeless Tobacco Current User  . Types: Snuff  Tobacco Comment   12/05/2013 "use snuff q now and again"    Goals Met:  Independence with exercise equipment Exercise tolerated well No report of cardiac concerns or symptoms Strength training completed today  Goals Unmet:  Not Applicable  Comments: check out 0915   Dr. Kathie Dike is Medical Director for [facility] Re[facility].

## 2020-06-11 ENCOUNTER — Other Ambulatory Visit: Payer: Self-pay

## 2020-06-11 ENCOUNTER — Encounter (HOSPITAL_COMMUNITY)
Admission: RE | Admit: 2020-06-11 | Discharge: 2020-06-11 | Disposition: A | Discharge status: Home / Self Care | Payer: Medicare Other | Source: Ambulatory Visit | Attending: Cardiology | Admitting: Cardiology

## 2020-06-11 DIAGNOSIS — I214 Non-ST elevation (NSTEMI) myocardial infarction: Secondary | ICD-10-CM

## 2020-06-11 NOTE — Progress Notes (Signed)
Daily Session Note  Patient Details  Name: Ryan Vaughn MRN: [id number] Date of Birth: 02/08/1951 Referring Provider:    Encounter Date: 06/11/2020  Check In:  Session Check In - 06/11/20 0815      Check-In   Supervising physician immediately available to respond to emergencies [facility] MD immediately available    Physician(s) Dr. McDowell    Location [facility]    Staff Present Madison Karch, MS, Exercise Physiologist;Dalton Fletcher, MS, ACSM-CEP, Exercise Physiologist    Virtual Visit No    Medication changes reported     No    Fall or balance concerns reported    No    Tobacco Cessation No Change    Warm-up and Cool-down Performed as group-led instruction    Resistance Training Performed Yes    VAD Patient? No    PAD/SET Patient? No      Pain Assessment   Currently in Pain? No/denies    Pain Score 0-No pain    Multiple Pain Sites No           Capillary Blood Glucose: No results found for this or any previous visit (from the past 24 hour(s)).    Social History   Tobacco Use  Smoking Status Never Smoker  Smokeless Tobacco Current User  . Types: Snuff  Tobacco Comment   12/05/2013 "use snuff q now and again"    Goals Met:  Independence with exercise equipment Exercise tolerated well No report of cardiac concerns or symptoms Strength training completed today  Goals Unmet:  Not Applicable  Comments: check out 0915   Dr. Jehanzeb Memon is Medical Director for [facility]. 

## 2020-06-14 ENCOUNTER — Encounter (HOSPITAL_COMMUNITY)
Admission: RE | Admit: 2020-06-14 | Discharge: 2020-06-14 | Disposition: A | Discharge status: Home / Self Care | Payer: Medicare Other | Source: Ambulatory Visit | Attending: Cardiology | Admitting: Cardiology

## 2020-06-14 ENCOUNTER — Other Ambulatory Visit: Payer: Self-pay

## 2020-06-14 VITALS — Wt 197.3 lb

## 2020-06-14 DIAGNOSIS — I214 Non-ST elevation (NSTEMI) myocardial infarction: Secondary | ICD-10-CM

## 2020-06-14 NOTE — Progress Notes (Signed)
Daily Session Note  Patient Details  Name: Ryan Vaughn MRN: [id number] Date of Birth: [birth year] Referring Provider:    Encounter Date: 06/14/2020  Check In:  Session Check In - 06/14/20 0815      Check-In   Supervising physician immediately available to respond to emergencies [facility] MD immediately available    Physician(s) Dr. Harl Bowie    Location [facility]    Staff Present [name], MS, Exercise Physiologist;Florean Hoobler Kris Mouton, MS, ACSM-CEP, Exercise Physiologist;Debra Wynetta Emery, RN, BSN    Virtual Visit No    Medication changes reported     No    Fall or balance concerns reported    No    Tobacco Cessation No Change    Warm-up and Cool-down Performed as group-led instruction    Resistance Training Performed Yes    VAD Patient? No    PAD/SET Patient? No      Pain Assessment   Currently in Pain? No/denies    Pain Score 0-No pain    Multiple Pain Sites No           Capillary Blood Glucose: No results found for this or any previous visit (from the past 24 hour(s)).    Social History   Tobacco Use  Smoking Status Never Smoker  Smokeless Tobacco Current User  . Types: Snuff  Tobacco Comment   12/05/2013 "use snuff q now and again"    Goals Met:  Independence with exercise equipment Exercise tolerated well No report of cardiac concerns or symptoms Strength training completed today  Goals Unmet:  Not Applicable  Comments: checkout time is 0915   Dr. Kathie Dike is Medical Director for [facility] Pulmonary Rehab.

## 2020-06-16 ENCOUNTER — Encounter (HOSPITAL_COMMUNITY)
Admission: RE | Admit: 2020-06-16 | Discharge: 2020-06-16 | Disposition: A | Discharge status: Home / Self Care | Payer: Medicare Other | Source: Ambulatory Visit | Attending: Cardiology | Admitting: Cardiology

## 2020-06-16 ENCOUNTER — Other Ambulatory Visit: Payer: Self-pay

## 2020-06-16 DIAGNOSIS — I214 Non-ST elevation (NSTEMI) myocardial infarction: Secondary | ICD-10-CM

## 2020-06-16 NOTE — Progress Notes (Signed)
Daily Session Note  Patient Details  Name: Ryan Vaughn MRN: [id number] Date of Birth: [birth year] Referring Provider:    Encounter Date: 06/16/2020  Check In:  Session Check In - 06/16/20 0830      Check-In   Supervising physician immediately available to respond to emergencies [facility] MD immediately available    Physician(s) Branch    Location [facility]    Staff Present Geanie Cooley, RN;Madison Audria Nine, MS, Exercise Physiologist;[name], MS, ACSM-CEP, Exercise Physiologist    Virtual Visit No    Medication changes reported     No    Fall or balance concerns reported    No    Tobacco Cessation No Change    Warm-up and Cool-down Performed as group-led instruction    Resistance Training Performed Yes    VAD Patient? No    PAD/SET Patient? No      Pain Assessment   Currently in Pain? No/denies    Pain Score 0-No pain    Multiple Pain Sites No           Capillary Blood Glucose: No results found for this or any previous visit (from the past 24 hour(s)).    Social History   Tobacco Use  Smoking Status Never Smoker  Smokeless Tobacco Current User  . Types: Snuff  Tobacco Comment   12/05/2013 "use snuff q now and again"    Goals Met:  Independence with exercise equipment Exercise tolerated well No report of cardiac concerns or symptoms Strength training completed today  Goals Unmet:  Not Applicable  Comments: check out @ 9:30   Dr. Kathie Dike is Medical Director for [facility] Pulmonary Rehab.

## 2020-06-18 ENCOUNTER — Encounter (HOSPITAL_COMMUNITY): Payer: Medicare Other

## 2020-06-21 ENCOUNTER — Other Ambulatory Visit: Payer: Self-pay

## 2020-06-21 ENCOUNTER — Encounter (HOSPITAL_COMMUNITY)
Admission: RE | Admit: 2020-06-21 | Discharge: 2020-06-21 | Disposition: A | Discharge status: Home / Self Care | Payer: Medicare Other | Source: Ambulatory Visit | Attending: Cardiology | Admitting: Cardiology

## 2020-06-21 DIAGNOSIS — I214 Non-ST elevation (NSTEMI) myocardial infarction: Secondary | ICD-10-CM

## 2020-06-21 NOTE — Progress Notes (Signed)
Daily Session Note  Patient Details  Name: Ryan Vaughn MRN: [id number] Date of Birth: Sep 22, 1950 Referring Provider:    Encounter Date: 06/21/2020  Check In:  Session Check In - 06/21/20 0815      Check-In   Supervising physician immediately available to respond to emergencies [facility] MD immediately available    Physician(s) Domenic Polite    Location [facility]    Staff Present Geanie Cooley, RN;Debra Wynetta Emery, RN, BSN;Madison Audria Nine, MS, Exercise Physiologist;[name], MS, ACSM-CEP, Exercise Physiologist    Virtual Visit No    Medication changes reported     No    Fall or balance concerns reported    No    Tobacco Cessation No Change    Warm-up and Cool-down Performed as group-led instruction    Resistance Training Performed Yes    VAD Patient? No    PAD/SET Patient? No      Pain Assessment   Currently in Pain? No/denies    Pain Score 0-No pain    Multiple Pain Sites No           Capillary Blood Glucose: No results found for this or any previous visit (from the past 24 hour(s)).    Social History   Tobacco Use  Smoking Status Never Smoker  Smokeless Tobacco Current User  . Types: Snuff  Tobacco Comment   12/05/2013 "use snuff q now and again"    Goals Met:  Independence with exercise equipment Exercise tolerated well No report of cardiac concerns or symptoms Strength training completed today  Goals Unmet:  Not Applicable  Comments: check out @ 9:30   Dr. Kathie Dike is Medical Director for [facility] Pulmonary Rehab.

## 2020-06-23 ENCOUNTER — Encounter (HOSPITAL_COMMUNITY)
Admission: RE | Admit: 2020-06-23 | Discharge: 2020-06-23 | Disposition: A | Discharge status: Home / Self Care | Payer: Medicare Other | Source: Ambulatory Visit | Attending: Cardiology | Admitting: Cardiology

## 2020-06-23 ENCOUNTER — Other Ambulatory Visit: Payer: Self-pay

## 2020-06-23 DIAGNOSIS — I214 Non-ST elevation (NSTEMI) myocardial infarction: Secondary | ICD-10-CM | POA: Diagnosis not present

## 2020-06-23 NOTE — Progress Notes (Signed)
Daily Session Note  Patient Details  Name: Ryan Vaughn MRN: [id number] Date of Birth: 10/18/1950 Referring Provider:    Encounter Date: 06/23/2020  Check In:  Session Check In - 06/23/20 0815      Check-In   Supervising physician immediately available to respond to emergencies [facility] MD immediately available    Physician(s) Domenic Polite    Location [facility]    Staff Present [name], MS, Exercise Physiologist;[name], MS, ACSM-CEP, Exercise Physiologist    Virtual Visit No    Medication changes reported     No    Fall or balance concerns reported    No    Tobacco Cessation No Change    Warm-up and Cool-down Performed as group-led instruction    Resistance Training Performed Yes    VAD Patient? No    PAD/SET Patient? No      Pain Assessment   Currently in Pain? No/denies    Pain Score 0-No pain    Multiple Pain Sites No           Capillary Blood Glucose: No results found for this or any previous visit (from the past 24 hour(s)).    Social History   Tobacco Use  Smoking Status Never Smoker  Smokeless Tobacco Current User  . Types: Snuff  Tobacco Comment   12/05/2013 "use snuff q now and again"    Goals Met:  Independence with exercise equipment Exercise tolerated well No report of cardiac concerns or symptoms Strength training completed today  Goals Unmet:  Not Applicable  Comments: check out 0915   Dr. Kathie Dike is Medical Director for [facility] Pulmonary Rehab.

## 2020-06-25 ENCOUNTER — Encounter (HOSPITAL_COMMUNITY)
Admission: RE | Admit: 2020-06-25 | Discharge: 2020-06-25 | Disposition: A | Discharge status: Home / Self Care | Payer: Medicare Other | Source: Ambulatory Visit | Attending: Cardiology | Admitting: Cardiology

## 2020-06-25 ENCOUNTER — Other Ambulatory Visit: Payer: Self-pay

## 2020-06-25 DIAGNOSIS — I214 Non-ST elevation (NSTEMI) myocardial infarction: Secondary | ICD-10-CM

## 2020-06-25 NOTE — Progress Notes (Signed)
Daily Session Note  Patient Details  Name: Ryan Vaughn MRN: [id number] Date of Birth: [birth year] Referring Provider:    Encounter Date: 06/25/2020  Check In:  Session Check In - 06/25/20 0815      Check-In   Supervising physician immediately available to respond to emergencies [facility] MD immediately available    Physician(s) Dr. Harl Bowie    Location [facility]    Staff Present [name], MS, Exercise Physiologist;Janira Mandell Kris Mouton, MS, ACSM-CEP, Exercise Physiologist    Virtual Visit No    Medication changes reported     No    Fall or balance concerns reported    No    Tobacco Cessation No Change    Warm-up and Cool-down Performed as group-led instruction    Resistance Training Performed Yes    VAD Patient? No    PAD/SET Patient? No      Pain Assessment   Currently in Pain? No/denies    Pain Score 0-No pain    Multiple Pain Sites No           Capillary Blood Glucose: No results found for this or any previous visit (from the past 24 hour(s)).    Social History   Tobacco Use  Smoking Status Never Smoker  Smokeless Tobacco Current User  . Types: Snuff  Tobacco Comment   12/05/2013 "use snuff q now and again"    Goals Met:  Independence with exercise equipment Exercise tolerated well No report of cardiac concerns or symptoms Strength training completed today  Goals Unmet:  Not Applicable  Comments: checkout time is 0915   Dr. Kathie Dike is Medical Director for [facility] Pulmonary Rehab.

## 2020-06-28 ENCOUNTER — Encounter (HOSPITAL_COMMUNITY)
Admission: RE | Admit: 2020-06-28 | Discharge: 2020-06-28 | Disposition: A | Discharge status: Home / Self Care | Payer: Medicare Other | Source: Ambulatory Visit | Attending: Cardiology | Admitting: Cardiology

## 2020-06-28 ENCOUNTER — Other Ambulatory Visit: Payer: Self-pay

## 2020-06-28 VITALS — Wt 221.8 lb

## 2020-06-28 DIAGNOSIS — I214 Non-ST elevation (NSTEMI) myocardial infarction: Secondary | ICD-10-CM | POA: Diagnosis not present

## 2020-06-28 NOTE — Progress Notes (Signed)
Daily Session Note  Patient Details  Name: Ryan Vaughn MRN: [id number] Date of Birth: [birth year] Referring Provider:    Encounter Date: 06/28/2020  Check In:  Session Check In - 06/28/20 0815      Check-In   Supervising physician immediately available to respond to emergencies [facility] MD immediately available    Physician(s) Dr. Harl Bowie    Location [facility]    Staff Present [name], MS, Exercise Physiologist;[name], MS, ACSM-CEP, Exercise Physiologist    Virtual Visit No    Medication changes reported     No    Fall or balance concerns reported    No    Tobacco Cessation No Change    Warm-up and Cool-down Performed as group-led instruction    Resistance Training Performed Yes    VAD Patient? No    PAD/SET Patient? No      Pain Assessment   Currently in Pain? No/denies    Pain Score 0-No pain    Multiple Pain Sites No           Capillary Blood Glucose: No results found for this or any previous visit (from the past 24 hour(s)).    Social History   Tobacco Use  Smoking Status Never Smoker  Smokeless Tobacco Current User  . Types: Snuff  Tobacco Comment   12/05/2013 "use snuff q now and again"    Goals Met:  Independence with exercise equipment Exercise tolerated well No report of cardiac concerns or symptoms Strength training completed today  Goals Unmet:  Not Applicable  Comments: check out 0915   Dr. Kathie Dike is Medical Director for [facility] Pulmonary Rehab.

## 2020-06-30 ENCOUNTER — Other Ambulatory Visit: Payer: Self-pay

## 2020-06-30 ENCOUNTER — Encounter (HOSPITAL_COMMUNITY)
Admission: RE | Admit: 2020-06-30 | Discharge: 2020-06-30 | Disposition: A | Discharge status: Home / Self Care | Payer: Medicare Other | Source: Ambulatory Visit | Attending: Cardiology | Admitting: Cardiology

## 2020-06-30 DIAGNOSIS — I214 Non-ST elevation (NSTEMI) myocardial infarction: Secondary | ICD-10-CM

## 2020-06-30 NOTE — Progress Notes (Signed)
Cardiac Individual Treatment Plan  Patient Details  Name: Ryan Vaughn MRN: [id number] Date of Birth: 08-06-1950 Referring Provider:    Initial Encounter Date:  [place] from 04/14/2020 in [place]  Date 04/14/20      Visit Diagnosis: NSTEMI (non-ST elevated myocardial infarction) [facility])  Patient's Home Medications on Admission:  Current Outpatient Medications:  .  aspirin EC 81 MG EC tablet, Take 1 tablet (81 mg total) by mouth daily. Swallow whole., Disp: 30 tablet, Rfl: 11 .  clopidogrel (PLAVIX) 75 MG tablet, Take 1 tablet (75 mg total) by mouth daily., Disp: 30 tablet, Rfl: 6 .  metFORMIN (GLUCOPHAGE) 500 MG tablet, Take 500 mg by mouth 2 (two) times daily with a meal., Disp: , Rfl:  .  metoprolol succinate (TOPROL-XL) 50 MG 24 hr tablet, Take 50 mg by mouth daily., Disp: , Rfl:  .  nitroGLYCERIN (NITROSTAT) 0.4 MG SL tablet, Place 1 tablet (0.4 mg total) under the tongue every 5 (five) minutes x 3 doses as needed for chest pain., Disp: 25 tablet, Rfl: 3 .  ranolazine (RANEXA) 500 MG 12 hr tablet, Take 1 tablet (500 mg total) by mouth 2 (two) times daily., Disp: 60 tablet, Rfl: 6 .  rosuvastatin (CRESTOR) 40 MG tablet, TAKE 1 TABLET(40 MG) BY MOUTH DAILY, Disp: 90 tablet, Rfl: 3  Past Medical History: Past Medical History:  Diagnosis Date  . Arthritis   . CAD (coronary artery disease)    a. DES to OM 1 and DES mLCX (12/05/13) b. CABG x1  ('92) c. 6 stents placed in 2009? (pt hx)   . Diabetes mellitus without complication ([place])    type 2  . Essential hypertension, benign   . Heart murmur    as a child   . History of kidney stones   . Lumbago   . Mixed hyperlipidemia   . Myocardial infarction [facility]) age 70  . Non Hodgkin's lymphoma ([place]) 2002   a. stage IV, chemo done  . Personal history of colonic polyps     Tobacco Use: Social History   Tobacco Use  Smoking Status Never Smoker  Smokeless Tobacco  Current User  . Types: Snuff  Tobacco Comment   12/05/2013 "use snuff q now and again"    Labs: Recent Review Flowsheet Data    Labs for ITP Cardiac and Pulmonary Rehab Latest Ref Rng & Units 07/15/2015 12/15/2018 10/01/2019 12/18/2019 12/19/2019   Cholestrol 0 - 200 mg/dL 148 - - - 104   LDLCALC 0 - 99 mg/dL UNABLE TO CALCULATE IF TRIGLYCERIDE OVER 400 mg/dL - - - 14   HDL >40 mg/dL 28(L) - - - 25(L)   Trlycerides <150 mg/dL 473(H) - - - 326(H)   Hemoglobin A1c 4.8 - 5.6 % - 6.8(H) - 6.8(H) -   TCO2 22 - 32 mmol/L - - 30 - -      Capillary Blood Glucose: Lab Results  Component Value Date   GLUCAP 96 04/14/2020   GLUCAP 79 04/14/2020   GLUCAP 126 (H) 12/20/2019   GLUCAP 281 (H) 12/20/2019   GLUCAP 192 (H) 12/20/2019     Exercise Target Goals: Exercise Program Goal: Individual exercise prescription set using results from initial 6 min walk test and THRR while considering  patient's activity barriers and safety.   Exercise Prescription Goal: Starting with aerobic activity 30 plus minutes a day, 3 days per week for initial exercise prescription. Provide home exercise prescription and guidelines that  participant acknowledges understanding prior to discharge.  Activity Barriers & Risk Stratification:  Activity Barriers & Cardiac Risk Stratification - 05/04/20 1018      Activity Barriers & Cardiac Risk Stratification   Activity Barriers Arthritis;Back Problems    Cardiac Risk Stratification High           6 Minute Walk:  6 Minute Walk    Row Name 04/14/20 1416         6 Minute Walk   Phase Initial     Distance 1400 feet     Walk Time 6 minutes     # of Rest Breaks 0     MPH 2.96     METS 2.7     RPE 9     VO2 Peak 10.37     Symptoms Yes (comment)     Comments Pt was experiencing chest tightness     Resting HR 69 bpm     Resting BP 118/72     Resting Oxygen Saturation  97 %     Max Ex. HR 84 bpm     Max Ex. BP 138/74     2 Minute Post BP 118/72             Oxygen Initial Assessment:   Oxygen Re-Evaluation:   Oxygen Discharge (Final Oxygen Re-Evaluation):   Initial Exercise Prescription:  Initial Exercise Prescription - 04/14/20 1400      Date of Initial Exercise RX and Referring Provider   Date 04/14/20    Expected Discharge Date 07/07/20      Treadmill   MPH 2    Grade 0    Minutes 17      Recumbant Elliptical   Level 1    RPM 60    Minutes 22      Prescription Details   Duration Progress to 30 minutes of continuous aerobic without signs/symptoms of physical distress      Intensity   THRR 40-80% of Max Heartrate 61-122    Ratings of Perceived Exertion 11-13      Progression   Progression Continue progressive overload as per policy without signs/symptoms or physical distress.      Resistance Training   Training Prescription Yes    Weight 3    Reps 10-15           Perform Capillary Blood Glucose checks as needed.  Exercise Prescription Changes:   Exercise Prescription Changes    Row Name 05/03/20 1100 05/17/20 0800 05/31/20 1200 06/02/20 0800 06/14/20 1200     Response to Exercise   Blood Pressure (Admit) 100/58 96/64 104/60 -- 102/74   Blood Pressure (Exercise) 122/70 138/68 126/64 -- 136/72   Blood Pressure (Exit) 110/72 98/58 96/62  -- 114/74   Heart Rate (Admit) 68 bpm 58 bpm 68 bpm -- 79 bpm   Heart Rate (Exercise) 97 bpm 107 bpm 106 bpm -- 107 bpm   Heart Rate (Exit) 77 bpm 70 bpm 76 bpm -- 96 bpm   Rating of Perceived Exertion (Exercise) 11 11 12  -- 12   Duration Continue with 30 min of aerobic exercise without signs/symptoms of physical distress. Continue with 30 min of aerobic exercise without signs/symptoms of physical distress. Continue with 30 min of aerobic exercise without signs/symptoms of physical distress. -- Continue with 30 min of aerobic exercise without signs/symptoms of physical distress.   Intensity THRR unchanged THRR unchanged THRR unchanged -- THRR unchanged     Progression    Progression Continue to progress workloads  to maintain intensity without signs/symptoms of physical distress. Continue to progress workloads to maintain intensity without signs/symptoms of physical distress. Continue to progress workloads to maintain intensity without signs/symptoms of physical distress. -- Continue to progress workloads to maintain intensity without signs/symptoms of physical distress.     Resistance Training   Training Prescription Yes Yes Yes -- Yes   Weight 3 lbs 4 lbs 4 lbs -- 4 lbs   Reps 10-15 10-15 10-15 -- 10-15   Time 10 Minutes 10 Minutes 10 Minutes -- 10 Minutes     Treadmill   MPH 2.1 2.2 2.3 -- 2.5   Grade 0 0.5 1.5 -- 1.5   Minutes 17 17 17  -- 17   METs 2.61 2.82 3.22 -- 3.41     Recumbant Elliptical   Level 1 1 2  -- 3   RPM 71 73 85 -- 88   Minutes 22 22 22  -- 22   METs 4 4 5  -- 4.9     Home Exercise Plan   Plans to continue exercise at -- -- -- Home (comment) --   Frequency -- -- -- Add 2 additional days to program exercise sessions. --   Initial Home Exercises Provided -- -- -- 06/02/20 --   [place] Name 06/28/20 1000             Response to Exercise   Blood Pressure (Admit) 110/70       Blood Pressure (Exercise) 156/70       Blood Pressure (Exit) 104/72       Heart Rate (Admit) 61 bpm       Heart Rate (Exercise) 106 bpm       Heart Rate (Exit) 70 bpm       Rating of Perceived Exertion (Exercise) 12       Duration Continue with 30 min of aerobic exercise without signs/symptoms of physical distress.       Intensity THRR unchanged               Progression   Progression Continue to progress workloads to maintain intensity without signs/symptoms of physical distress.               Resistance Training   Training Prescription Yes       Weight 5 lbs       Reps 10-15       Time 10 Minutes               Treadmill   MPH 2.8       Grade 1.5       Minutes 17       METs 3.72               Recumbant Elliptical   Level 3       RPM 82        Minutes 22       METs 6.4              Exercise Comments:   Exercise Comments    Row Name 04/28/20 0939 06/02/20 0849         Exercise Comments Pt completed first exercise session. He tolerated exercise well and enjoyed his first day at rehab. Reviewed home exercise program with patient.             Exercise Goals and Review:   Exercise Goals    Row Name 04/14/20 1433 05/03/20 1159 05/31/20 1215 06/28/20 1027       Exercise Goals  Increase Physical Activity Yes Yes Yes Yes    Intervention Provide advice, education, support and counseling about physical activity/exercise needs.;Develop an individualized exercise prescription for aerobic and resistive training based on initial evaluation findings, risk stratification, comorbidities and participant's personal goals. Provide advice, education, support and counseling about physical activity/exercise needs.;Develop an individualized exercise prescription for aerobic and resistive training based on initial evaluation findings, risk stratification, comorbidities and participant's personal goals. Provide advice, education, support and counseling about physical activity/exercise needs.;Develop an individualized exercise prescription for aerobic and resistive training based on initial evaluation findings, risk stratification, comorbidities and participant's personal goals. Provide advice, education, support and counseling about physical activity/exercise needs.;Develop an individualized exercise prescription for aerobic and resistive training based on initial evaluation findings, risk stratification, comorbidities and participant's personal goals.    Expected Outcomes Short Term: Attend rehab on a regular basis to increase amount of physical activity.;Long Term: Add in home exercise to make exercise part of routine and to increase amount of physical activity.;Long Term: Exercising regularly at least 3-5 days a week. Short Term: Attend rehab on  a regular basis to increase amount of physical activity.;Long Term: Add in home exercise to make exercise part of routine and to increase amount of physical activity.;Long Term: Exercising regularly at least 3-5 days a week. Short Term: Attend rehab on a regular basis to increase amount of physical activity.;Long Term: Add in home exercise to make exercise part of routine and to increase amount of physical activity.;Long Term: Exercising regularly at least 3-5 days a week. Short Term: Attend rehab on a regular basis to increase amount of physical activity.;Long Term: Add in home exercise to make exercise part of routine and to increase amount of physical activity.;Long Term: Exercising regularly at least 3-5 days a week.    Increase Strength and Stamina Yes Yes Yes Yes    Intervention Provide advice, education, support and counseling about physical activity/exercise needs.;Develop an individualized exercise prescription for aerobic and resistive training based on initial evaluation findings, risk stratification, comorbidities and participant's personal goals. Provide advice, education, support and counseling about physical activity/exercise needs.;Develop an individualized exercise prescription for aerobic and resistive training based on initial evaluation findings, risk stratification, comorbidities and participant's personal goals. Provide advice, education, support and counseling about physical activity/exercise needs.;Develop an individualized exercise prescription for aerobic and resistive training based on initial evaluation findings, risk stratification, comorbidities and participant's personal goals. Develop an individualized exercise prescription for aerobic and resistive training based on initial evaluation findings, risk stratification, comorbidities and participant's personal goals.;Provide advice, education, support and counseling about physical activity/exercise needs.    Expected Outcomes Short  Term: Increase workloads from initial exercise prescription for resistance, speed, and METs.;Short Term: Perform resistance training exercises routinely during rehab and add in resistance training at home;Long Term: Improve cardiorespiratory fitness, muscular endurance and strength as measured by increased METs and functional capacity (6MWT) Short Term: Increase workloads from initial exercise prescription for resistance, speed, and METs.;Short Term: Perform resistance training exercises routinely during rehab and add in resistance training at home;Long Term: Improve cardiorespiratory fitness, muscular endurance and strength as measured by increased METs and functional capacity (6MWT) Short Term: Increase workloads from initial exercise prescription for resistance, speed, and METs.;Short Term: Perform resistance training exercises routinely during rehab and add in resistance training at home;Long Term: Improve cardiorespiratory fitness, muscular endurance and strength as measured by increased METs and functional capacity (6MWT) Short Term: Increase workloads from initial exercise prescription for resistance, speed, and METs.;Short Term: Perform  resistance training exercises routinely during rehab and add in resistance training at home;Long Term: Improve cardiorespiratory fitness, muscular endurance and strength as measured by increased METs and functional capacity (6MWT)    Able to understand and use rate of perceived exertion (RPE) scale Yes Yes Yes Yes    Intervention Provide education and explanation on how to use RPE scale Provide education and explanation on how to use RPE scale Provide education and explanation on how to use RPE scale Provide education and explanation on how to use RPE scale    Expected Outcomes Short Term: Able to use RPE daily in rehab to express subjective intensity level;Long Term:  Able to use RPE to guide intensity level when exercising independently Short Term: Able to use RPE daily  in rehab to express subjective intensity level;Long Term:  Able to use RPE to guide intensity level when exercising independently Short Term: Able to use RPE daily in rehab to express subjective intensity level;Long Term:  Able to use RPE to guide intensity level when exercising independently Short Term: Able to use RPE daily in rehab to express subjective intensity level;Long Term:  Able to use RPE to guide intensity level when exercising independently    Knowledge and understanding of Target Heart Rate Range (THRR) Yes Yes Yes Yes    Intervention Provide education and explanation of THRR including how the numbers were predicted and where they are located for reference Provide education and explanation of THRR including how the numbers were predicted and where they are located for reference Provide education and explanation of THRR including how the numbers were predicted and where they are located for reference Provide education and explanation of THRR including how the numbers were predicted and where they are located for reference    Expected Outcomes Short Term: Able to state/look up THRR;Long Term: Able to use THRR to govern intensity when exercising independently;Short Term: Able to use daily as guideline for intensity in rehab Short Term: Able to state/look up THRR;Long Term: Able to use THRR to govern intensity when exercising independently;Short Term: Able to use daily as guideline for intensity in rehab Short Term: Able to state/look up THRR;Long Term: Able to use THRR to govern intensity when exercising independently;Short Term: Able to use daily as guideline for intensity in rehab Short Term: Able to state/look up THRR;Long Term: Able to use THRR to govern intensity when exercising independently;Short Term: Able to use daily as guideline for intensity in rehab    Able to check pulse independently Yes Yes Yes Yes    Intervention Provide education and demonstration on how to check pulse in carotid  and radial arteries.;Review the importance of being able to check your own pulse for safety during independent exercise Provide education and demonstration on how to check pulse in carotid and radial arteries.;Review the importance of being able to check your own pulse for safety during independent exercise Provide education and demonstration on how to check pulse in carotid and radial arteries.;Review the importance of being able to check your own pulse for safety during independent exercise Provide education and demonstration on how to check pulse in carotid and radial arteries.;Review the importance of being able to check your own pulse for safety during independent exercise    Expected Outcomes Short Term: Able to explain why pulse checking is important during independent exercise;Long Term: Able to check pulse independently and accurately Short Term: Able to explain why pulse checking is important during independent exercise;Long Term: Able to check pulse  independently and accurately Short Term: Able to explain why pulse checking is important during independent exercise;Long Term: Able to check pulse independently and accurately Short Term: Able to explain why pulse checking is important during independent exercise;Long Term: Able to check pulse independently and accurately    Understanding of Exercise Prescription Yes Yes Yes Yes    Intervention Provide education, explanation, and written materials on patient's individual exercise prescription Provide education, explanation, and written materials on patient's individual exercise prescription Provide education, explanation, and written materials on patient's individual exercise prescription Provide education, explanation, and written materials on patient's individual exercise prescription    Expected Outcomes Short Term: Able to explain program exercise prescription;Long Term: Able to explain home exercise prescription to exercise independently Short Term:  Able to explain program exercise prescription;Long Term: Able to explain home exercise prescription to exercise independently Short Term: Able to explain program exercise prescription;Long Term: Able to explain home exercise prescription to exercise independently Short Term: Able to explain program exercise prescription;Long Term: Able to explain home exercise prescription to exercise independently           Exercise Goals Re-Evaluation :  Exercise Goals Re-Evaluation    Row Name 05/03/20 1200 05/31/20 1216 06/28/20 1024         Exercise Goal Re-Evaluation   Exercise Goals Review Increase Physical Activity;Increase Strength and Stamina;Able to understand and use rate of perceived exertion (RPE) scale;Knowledge and understanding of Target Heart Rate Range (THRR);Able to check pulse independently;Understanding of Exercise Prescription Increase Physical Activity;Increase Strength and Stamina;Able to understand and use rate of perceived exertion (RPE) scale;Knowledge and understanding of Target Heart Rate Range (THRR);Able to check pulse independently;Understanding of Exercise Prescription Increase Physical Activity;Increase Strength and Stamina;Able to understand and use rate of perceived exertion (RPE) scale;Knowledge and understanding of Target Heart Rate Range (THRR);Able to check pulse independently;Understanding of Exercise Prescription     Comments Pt has attended 3 exercise sessions. He is motivated to exercise and shows a motivated outlook. He currently exercises at 4.0 METs on the elliptical. Will continue to monitor and progress as able. Pt has attended 13 exercise sessions. He is progressing quickly and is continuously increasing his workloads. He is highly motivated and has been walking at home on his off days from rehab. He crrently exercises at 5.0 METs on the elliptical. Will continue to monitor and progress as able. Patient has completed 24 exercise sessions. He is progressing very quickly  and is increasing the workload. He enjoys coming to rehab and is highly motivated to get better. He is currently exercising at 6.4 METs on th elliptical. He is progressing his home exercise as well by walking longer. Will continue to progress as able.     Expected Outcomes Through exercise at rehab and by engaging in a home exercise program the patient will reach their goals. Through exercise at rehab and by engaging in a home exercise program the patient will reach their goals. Through exercise at rehab and a home exercise program, patient will achieve their goals.             Discharge Exercise Prescription (Final Exercise Prescription Changes):  Exercise Prescription Changes - 06/28/20 1000      Response to Exercise   Blood Pressure (Admit) 110/70    Blood Pressure (Exercise) 156/70    Blood Pressure (Exit) 104/72    Heart Rate (Admit) 61 bpm    Heart Rate (Exercise) 106 bpm    Heart Rate (Exit) 70 bpm  Rating of Perceived Exertion (Exercise) 12    Duration Continue with 30 min of aerobic exercise without signs/symptoms of physical distress.    Intensity THRR unchanged      Progression   Progression Continue to progress workloads to maintain intensity without signs/symptoms of physical distress.      Resistance Training   Training Prescription Yes    Weight 5 lbs    Reps 10-15    Time 10 Minutes      Treadmill   MPH 2.8    Grade 1.5    Minutes 17    METs 3.72      Recumbant Elliptical   Level 3    RPM 82    Minutes 22    METs 6.4           Nutrition:  Target Goals: Understanding of nutrition guidelines, daily intake of sodium 1500mg , cholesterol 200mg , calories 30% from fat and 7% or less from saturated fats, daily to have 5 or more servings of fruits and vegetables.  Biometrics:  Pre Biometrics - 06/28/20 1024      Pre Biometrics   Weight 100.6 kg            Nutrition Therapy Plan and Nutrition Goals:  Nutrition Therapy & Goals - 06/21/20 0845       Personal Nutrition Goals   Comments We will continue to provide education through handouts.      Intervention Plan   Intervention Nutrition handout(s) given to patient.           Nutrition Assessments:  Nutrition Assessments - 04/14/20 1415      MEDFICTS Scores   Pre Score 59          MEDIFICTS Score Key:  ?70 Need to make dietary changes   40-70 Heart Healthy Diet  ? 40 Therapeutic Level Cholesterol Diet   Picture Your Plate Scores:  <76 Unhealthy dietary pattern with much room for improvement.  41-50 Dietary pattern unlikely to meet recommendations for good health and room for improvement.  51-60 More healthful dietary pattern, with some room for improvement.   >60 Healthy dietary pattern, although there may be some specific behaviors that could be improved.    Nutrition Goals Re-Evaluation:   Nutrition Goals Discharge (Final Nutrition Goals Re-Evaluation):   Psychosocial: Target Goals: Acknowledge presence or absence of significant depression and/or stress, maximize coping skills, provide positive support system. Participant is able to verbalize types and ability to use techniques and skills needed for reducing stress and depression.  Initial Review & Psychosocial Screening:  Initial Psych Review & Screening - 04/14/20 1411      Initial Review   Current issues with None Identified      Family Dynamics   Good Support System? Yes    Comments Patient lives with his girlfriend. He says she and other friends are his support system. He has a positive outlook about his future. He denies any depression or anxiety. Will continue to monitor.      Barriers   Psychosocial barriers to participate in program There are no identifiable barriers or psychosocial needs.      Screening Interventions   Interventions Encouraged to exercise    Expected Outcomes Short Term goal: Utilizing psychosocial counselor, staff and physician to assist with identification of  specific Stressors or current issues interfering with healing process. Setting desired goal for each stressor or current issue identified.;Short Term goal: Identification and review with participant of any Quality of Life or Depression concerns  found by scoring the questionnaire.           Quality of Life Scores:  Quality of Life - 04/14/20 1414      Quality of Life   Select Quality of Life      Quality of Life Scores   Health/Function Pre 23.6 %    Socioeconomic Pre 28.07 %    Psych/Spiritual Pre 30 %    Family Pre 26.4 %    GLOBAL Pre 26.25 %          Scores of 19 and below usually indicate a poorer quality of life in these areas.  A difference of  2-3 points is a clinically meaningful difference.  A difference of 2-3 points in the total score of the Quality of Life Index has been associated with significant improvement in overall quality of life, self-image, physical symptoms, and general health in studies assessing change in quality of life.  PHQ-9: Recent Review Flowsheet Data    Depression screen [facility] 2/9 04/14/2020 01/20/2014   Decreased Interest 0 0   Down, Depressed, Hopeless 0 0   PHQ - 2 Score 0 0   Altered sleeping 0 -   Tired, decreased energy 1 -   Change in appetite 0 -   Feeling bad or failure about yourself  0 -   Trouble concentrating 0 -   Moving slowly or fidgety/restless 0 -   Suicidal thoughts 0 -   PHQ-9 Score 1 -   Difficult doing work/chores Not difficult at all -     Interpretation of Total Score  Total Score Depression Severity:  1-4 = Minimal depression, 5-9 = Mild depression, 10-14 = Moderate depression, 15-19 = Moderately severe depression, 20-27 = Severe depression   Psychosocial Evaluation and Intervention:  Psychosocial Evaluation - 04/14/20 1413      Psychosocial Evaluation & Interventions   Interventions Stress management education;Relaxation education;Encouraged to exercise with the program and follow exercise prescription    Comments  Patient has no psychosocial issues identified at his orientation visit. His inital QOL was 26.25 overall and his PHQ-9 socre was 1. Will continue to monitor.    Expected Outcomes Patient will have no psychosocial issues identified at discharge.    Continue Psychosocial Services  No Follow up required           Psychosocial Re-Evaluation:  Psychosocial Re-Evaluation    [place] Name 05/26/20 [id number] 06/21/20 0845           Psychosocial Re-Evaluation   Current issues with None Identified None Identified      Comments Patient continues to have no psychosocial issues identified. He has completed 11 sessions with consistent attendance. He continues to demonstrate a positive attitude regarding his future and is very interactive with staff and other patients in his class. Will continue to monitor. Patient continues to have no psychosocial issues identified. He has completed 20 sessions with consistent attendance. He continues to demonstrate a positive attitude regarding his future and is very interactive with staff and other patients in his class. Will continue to monitor.      Expected Outcomes Patient will have no psychosocial issues identifed at discharge. Patient will have no psychosocial issues identifed at discharge.      Interventions Relaxation education;Stress management education;Encouraged to attend Cardiac Rehabilitation for the exercise Relaxation education;Stress management education;Encouraged to attend Cardiac Rehabilitation for the exercise      Continue Psychosocial Services  No Follow up required No Follow up required  Psychosocial Discharge (Final Psychosocial Re-Evaluation):  Psychosocial Re-Evaluation - 06/21/20 0845      Psychosocial Re-Evaluation   Current issues with None Identified    Comments Patient continues to have no psychosocial issues identified. He has completed 20 sessions with consistent attendance. He continues to demonstrate a positive attitude  regarding his future and is very interactive with staff and other patients in his class. Will continue to monitor.    Expected Outcomes Patient will have no psychosocial issues identifed at discharge.    Interventions Relaxation education;Stress management education;Encouraged to attend Cardiac Rehabilitation for the exercise    Continue Psychosocial Services  No Follow up required           Vocational Rehabilitation: Provide vocational rehab assistance to qualifying candidates.   Vocational Rehab Evaluation & Intervention:  Vocational Rehab - 04/14/20 1415      Initial Vocational Rehab Evaluation & Intervention   Assessment shows need for Vocational Rehabilitation No      Vocational Rehab Re-Evaulation   Comments Patient says he would like to get a part time job after he finishes rehab but is not interested in vocational rehab.           Education: Education Goals: Education classes will be provided on a weekly basis, covering required topics. Participant will state understanding/return demonstration of topics presented.  Learning Barriers/Preferences:  Learning Barriers/Preferences - 04/14/20 1415      Learning Barriers/Preferences   Learning Barriers None    Learning Preferences Audio           Education Topics: Hypertension, Hypertension Reduction -Define heart disease and high blood pressure. Discus how high blood pressure affects the body and ways to reduce high blood pressure. Flowsheet Row CARDIAC REHAB PHASE II EXERCISE from 06/23/2020 in [place]  Date 05/26/20  Educator [facility]  Instruction Review Code 2- Demonstrated Understanding      Exercise and Your Heart -Discuss why it is important to exercise, the FITT principles of exercise, normal and abnormal responses to exercise, and how to exercise safely. Flowsheet Row CARDIAC REHAB PHASE II EXERCISE from 06/23/2020 in [place]  Date 06/02/20  Educator Mk   Instruction Review Code 2- Demonstrated Understanding      Angina -Discuss definition of angina, causes of angina, treatment of angina, and how to decrease risk of having angina. Flowsheet Row CARDIAC REHAB PHASE II EXERCISE from 06/23/2020 in [place]  Date 06/09/20  Educator [facility]  Instruction Review Code 2- Demonstrated Understanding      Cardiac Medications -Review what the following cardiac medications are used for, how they affect the body, and side effects that may occur when taking the medications.  Medications include Aspirin, Beta blockers, calcium channel blockers, ACE Inhibitors, angiotensin receptor blockers, diuretics, digoxin, and antihyperlipidemics. Flowsheet Row CARDIAC REHAB PHASE II EXERCISE from 06/23/2020 in [place]  Date 06/16/20  Educator [facility]  Instruction Review Code 2- Demonstrated Understanding      Congestive Heart Failure -Discuss the definition of CHF, how to live with CHF, the signs and symptoms of CHF, and how keep track of weight and sodium intake. Flowsheet Row CARDIAC REHAB PHASE II EXERCISE from 06/23/2020 in [place]  Date 06/23/20  Educator [facility]  Instruction Review Code 2- Demonstrated Understanding      Heart Disease and Intimacy -Discus the effect sexual activity has on the heart, how changes occur during intimacy as we age, and safety during sexual activity.  Smoking Cessation / COPD -Discuss different methods to quit smoking, the health benefits of quitting smoking, and the definition of COPD.   Nutrition I: Fats -Discuss the types of cholesterol, what cholesterol does to the heart, and how cholesterol levels can be controlled.   Nutrition II: Labels -Discuss the different components of food labels and how to read food label   Heart Parts/Heart Disease and PAD -Discuss the anatomy of the heart, the pathway of blood circulation through the heart, and these are  affected by heart disease.   Stress I: Signs and Symptoms -Discuss the causes of stress, how stress may lead to anxiety and depression, and ways to limit stress. Flowsheet Row CARDIAC REHAB PHASE II EXERCISE from 06/23/2020 in [place]  Date 05/05/20  Educator [facility]  Instruction Review Code 2- Demonstrated Understanding      Stress II: Relaxation -Discuss different types of relaxation techniques to limit stress. Flowsheet Row CARDIAC REHAB PHASE II EXERCISE from 06/23/2020 in [place]  Date 05/12/20  Educator Mk  Instruction Review Code 2- Demonstrated Understanding      Warning Signs of Stroke / TIA -Discuss definition of a stroke, what the signs and symptoms are of a stroke, and how to identify when someone is having stroke. Flowsheet Row CARDIAC REHAB PHASE II EXERCISE from 06/23/2020 in [place]  Date 05/19/20  Educator [facility]  Instruction Review Code 2- Demonstrated Understanding      Knowledge Questionnaire Score:  Knowledge Questionnaire Score - 04/14/20 1415      Knowledge Questionnaire Score   Pre Score 23/28           Core Components/Risk Factors/Patient Goals at Admission:  Personal Goals and Risk Factors at Admission - 04/14/20 1416      Core Components/Risk Factors/Patient Goals on Admission    Weight Management Yes    Intervention Weight Management: Provide education and appropriate resources to help participant work on and attain dietary goals.;Weight Management/Obesity: Establish reasonable short term and long term weight goals.    Admit Weight 223 lb 14.4 oz (101.6 kg)    Goal Weight: Short Term 218 lb 14.4 oz (99.3 kg)    Goal Weight: Long Term 213 lb 9.6 oz (96.9 kg)    Expected Outcomes Long Term: Adherence to nutrition and physical activity/exercise program aimed toward attainment of established weight goal    Diabetes Yes    Intervention Provide education about signs/symptoms and  action to take for hypo/hyperglycemia.;Provide education about proper nutrition, including hydration, and aerobic/resistive exercise prescription along with prescribed medications to achieve blood glucose in normal ranges: Fasting glucose 65-99 mg/dL    Expected Outcomes Short Term: Participant verbalizes understanding of the signs/symptoms and immediate care of hyper/hypoglycemia, proper foot care and importance of medication, aerobic/resistive exercise and nutrition plan for blood glucose control.;Long Term: Attainment of HbA1C < 7%.    Personal Goal Other Yes    Personal Goal Lose 15 to 20 lbs. Increase energy. Complete the program.    Intervention Patient will attend CR 3 days/week and supplement with exercise at home 2 days/week.    Expected Outcomes Patient will meet both program and personal goals.           Core Components/Risk Factors/Patient Goals Review:   Goals and Risk Factor Review    Row Name 04/28/20 0745 05/26/20 0918 06/21/20 0845         Core Components/Risk Factors/Patient Goals Review   Personal Goals Review Weight Management/Obesity;Diabetes Weight  Management/Obesity;Diabetes --     Review Patient has no started the program. He has been exposed to [place] and has been in [place]. He is scheduled to start today. His goals are to lose 15 to 20 lbs; increase energy and complete the program. Will continue to monitor for progress. Patient has completed 11 sessions maintaining his weight since his initial visit. He is doing well in the program with progression and consistent attendance. He did not get COVID due to his exposure. He last A1C on file was 12/18/19 at 6.8%. He does not monitor his glucose at home. His blood pressures are WNL all three readings. His personal goals are to lose 15-20 lbs; increase energy; and complete the program. Will continue to monitor for progress. Patient has completed 20 session losing 4.9 lbs since last 30 reveiw. He continues to do very well in the  program with progress and consistent attendance. He has no recent A1C on file since 12/18/19 at 6.8%. All blood pressures are WNL. He says he is looking forward to [place] so he can get out and do more. He reports feeling stronger and having more energy. His personal goals are to lose 10 to 15 bls; increase energy; and complete the program. Will continue to monitor for progress.     Expected Outcomes Patient will complete the program meeting both program and personal goals. Patient will complete the program meeting both program and personal goals. Patient will complete the program meeting both program and personal goals.            Core Components/Risk Factors/Patient Goals at Discharge (Final Review):   Goals and Risk Factor Review - 06/21/20 0845      Core Components/Risk Factors/Patient Goals Review   Review Patient has completed 20 session losing 4.9 lbs since last 30 reveiw. He continues to do very well in the program with progress and consistent attendance. He has no recent A1C on file since 12/18/19 at 6.8%. All blood pressures are WNL. He says he is looking forward to [place] so he can get out and do more. He reports feeling stronger and having more energy. His personal goals are to lose 10 to 15 bls; increase energy; and complete the program. Will continue to monitor for progress.    Expected Outcomes Patient will complete the program meeting both program and personal goals.           ITP Comments:   Comments: ITP REVIEW Pt is making expected progress toward Cardiac Rehab goals after completing 24 sessions. Recommend continued exercise, life style modification, education, and increased stamina and strength.

## 2020-06-30 NOTE — Progress Notes (Signed)
Daily Session Note  Patient Details  Name: Ryan Vaughn MRN: [id number] Date of Birth: November 06, 1950 Referring Provider:    Encounter Date: 06/30/2020  Check In:  Session Check In - 06/30/20 0815      Check-In   Supervising physician immediately available to respond to emergencies [facility] MD immediately available    Physician(s) Dr. Harl Bowie    Location [facility]    Staff Present [name], MS, Exercise Physiologist;Shandiin Eisenbeis Kris Mouton, MS, ACSM-CEP, Exercise Physiologist    Virtual Visit No    Medication changes reported     No    Fall or balance concerns reported    No    Tobacco Cessation No Change    Warm-up and Cool-down Performed as group-led instruction    Resistance Training Performed Yes    VAD Patient? No    PAD/SET Patient? No      Pain Assessment   Currently in Pain? No/denies    Pain Score 0-No pain    Multiple Pain Sites No           Capillary Blood Glucose: No results found for this or any previous visit (from the past 24 hour(s)).    Social History   Tobacco Use  Smoking Status Never Smoker  Smokeless Tobacco Current User  . Types: Snuff  Tobacco Comment   12/05/2013 "use snuff q now and again"    Goals Met:  Independence with exercise equipment Exercise tolerated well No report of cardiac concerns or symptoms Strength training completed today  Goals Unmet:  Not Applicable  Comments: checkout time is 0915   Dr. Kathie Dike is Medical Director for [facility] Pulmonary Rehab.

## 2020-07-02 ENCOUNTER — Encounter (HOSPITAL_COMMUNITY)
Admission: RE | Admit: 2020-07-02 | Discharge: 2020-07-02 | Disposition: A | Discharge status: Home / Self Care | Payer: Medicare Other | Source: Ambulatory Visit | Attending: Cardiology | Admitting: Cardiology

## 2020-07-02 ENCOUNTER — Other Ambulatory Visit: Payer: Self-pay

## 2020-07-02 DIAGNOSIS — I214 Non-ST elevation (NSTEMI) myocardial infarction: Secondary | ICD-10-CM | POA: Diagnosis not present

## 2020-07-02 NOTE — Progress Notes (Signed)
Daily Session Note  Patient Details  Name: ELIYAH BAZZI MRN: [id number] Date of Birth: [birth year] Referring Provider:    Encounter Date: 07/02/2020  Check In:  Session Check In - 07/02/20 0815      Check-In   Supervising physician immediately available to respond to emergencies [facility] MD immediately available    Physician(s) Dr. Melene Muller    Location [facility]    Staff Present [name], MS, Exercise Physiologist;[name], MS, ACSM-CEP, Exercise Physiologist    Virtual Visit No    Medication changes reported     No    Fall or balance concerns reported    No    Tobacco Cessation No Change    Warm-up and Cool-down Performed as group-led instruction    Resistance Training Performed Yes    VAD Patient? No    PAD/SET Patient? No      Pain Assessment   Currently in Pain? No/denies    Pain Score 0-No pain    Multiple Pain Sites No           Capillary Blood Glucose: No results found for this or any previous visit (from the past 24 hour(s)).    Social History   Tobacco Use  Smoking Status Never Smoker  Smokeless Tobacco Current User  . Types: Snuff  Tobacco Comment   12/05/2013 "use snuff q now and again"    Goals Met:  Independence with exercise equipment Exercise tolerated well No report of cardiac concerns or symptoms Strength training completed today  Goals Unmet:  Not Applicable  Comments: check out 0915   Dr. Kathie Dike is Medical Director for [facility] Pulmonary Rehab.

## 2020-07-05 ENCOUNTER — Other Ambulatory Visit: Payer: Self-pay

## 2020-07-05 ENCOUNTER — Encounter (HOSPITAL_COMMUNITY)
Admission: RE | Admit: 2020-07-05 | Discharge: 2020-07-05 | Disposition: A | Discharge status: Home / Self Care | Payer: Medicare Other | Source: Ambulatory Visit | Attending: Cardiology | Admitting: Cardiology

## 2020-07-05 DIAGNOSIS — I214 Non-ST elevation (NSTEMI) myocardial infarction: Secondary | ICD-10-CM

## 2020-07-05 NOTE — Progress Notes (Signed)
Daily Session Note  Patient Details  Name: Ryan Vaughn MRN: [id number] Date of Birth: Sep 05, 1950 Referring Provider:    Encounter Date: 07/05/2020  Check In:  Session Check In - 07/05/20 0815      Check-In   Supervising physician immediately available to respond to emergencies [facility] MD immediately available    Physician(s) Dr. Johnsie Cancel    Location [facility]    Staff Present [name], MS, Exercise Physiologist;Swain Acree Kris Mouton, MS, ACSM-CEP, Exercise Physiologist    Virtual Visit No    Medication changes reported     No    Fall or balance concerns reported    No    Tobacco Cessation No Change    Warm-up and Cool-down Performed as group-led instruction    Resistance Training Performed Yes    VAD Patient? No    PAD/SET Patient? No      Pain Assessment   Currently in Pain? No/denies    Pain Score 0-No pain    Multiple Pain Sites No           Capillary Blood Glucose: No results found for this or any previous visit (from the past 24 hour(s)).    Social History   Tobacco Use  Smoking Status Never Smoker  Smokeless Tobacco Current User  . Types: Snuff  Tobacco Comment   12/05/2013 "use snuff q now and again"    Goals Met:  Independence with exercise equipment Exercise tolerated well No report of cardiac concerns or symptoms Strength training completed today  Goals Unmet:  Not Applicable  Comments: checkout time is 0915   Dr. Kathie Dike is Medical Director for [facility] Pulmonary Rehab.

## 2020-07-07 ENCOUNTER — Other Ambulatory Visit: Payer: Self-pay

## 2020-07-07 ENCOUNTER — Encounter (HOSPITAL_COMMUNITY)
Admission: RE | Admit: 2020-07-07 | Discharge: 2020-07-07 | Disposition: A | Discharge status: Home / Self Care | Payer: Medicare Other | Source: Ambulatory Visit | Attending: Cardiology | Admitting: Cardiology

## 2020-07-07 DIAGNOSIS — I214 Non-ST elevation (NSTEMI) myocardial infarction: Secondary | ICD-10-CM

## 2020-07-07 NOTE — Progress Notes (Signed)
Daily Session Note  Patient Details  Name: Ryan Vaughn MRN: [id number] Date of Birth: 22-Nov-1950 Referring Provider:    Encounter Date: 07/07/2020  Check In:  Session Check In - 07/07/20 0815      Check-In   Supervising physician immediately available to respond to emergencies [facility] MD immediately available    Physician(s) Dr. Johnsie Cancel    Location [facility]    Staff Present [name], MS, Exercise Physiologist;Layaan Mott Kris Mouton, MS, ACSM-CEP, Exercise Physiologist    Virtual Visit No    Medication changes reported     No    Fall or balance concerns reported    No    Tobacco Cessation No Change    Warm-up and Cool-down Performed as group-led instruction    Resistance Training Performed Yes    VAD Patient? No    PAD/SET Patient? No      Pain Assessment   Currently in Pain? No/denies    Pain Score 0-No pain    Multiple Pain Sites No           Capillary Blood Glucose: No results found for this or any previous visit (from the past 24 hour(s)).    Social History   Tobacco Use  Smoking Status Never Smoker  Smokeless Tobacco Current User  . Types: Snuff  Tobacco Comment   12/05/2013 "use snuff q now and again"    Goals Met:  Independence with exercise equipment Exercise tolerated well No report of cardiac concerns or symptoms Strength training completed today  Goals Unmet:  Not Applicable  Comments: checkout time is 0915   Dr. Kathie Dike is Medical Director for [facility] Pulmonary Rehab.

## 2020-07-09 ENCOUNTER — Other Ambulatory Visit: Payer: Self-pay

## 2020-07-09 ENCOUNTER — Encounter (HOSPITAL_COMMUNITY)
Admission: RE | Admit: 2020-07-09 | Discharge: 2020-07-09 | Disposition: A | Discharge status: Home / Self Care | Payer: Medicare Other | Source: Ambulatory Visit | Attending: Cardiology | Admitting: Cardiology

## 2020-07-09 VITALS — Wt 222.0 lb

## 2020-07-09 DIAGNOSIS — I214 Non-ST elevation (NSTEMI) myocardial infarction: Secondary | ICD-10-CM

## 2020-07-09 NOTE — Progress Notes (Signed)
Daily Session Note  Patient Details  Name: Ryan Vaughn MRN: [id number] Date of Birth: Jul 30, 1950 Referring Provider:    Encounter Date: 07/09/2020  Check In:  Session Check In - 07/09/20 0815      Check-In   Supervising physician immediately available to respond to emergencies [facility] MD immediately available    Physician(s) Dr. Johnsie Cancel    Location [facility]    Staff Present [name], MS, Exercise Physiologist;Aodhan Scheidt Kris Mouton, MS, ACSM-CEP, Exercise Physiologist    Virtual Visit No    Medication changes reported     No    Fall or balance concerns reported    No    Tobacco Cessation No Change    Warm-up and Cool-down Performed as group-led instruction    Resistance Training Performed Yes    VAD Patient? No    PAD/SET Patient? No      Pain Assessment   Currently in Pain? No/denies    Pain Score 0-No pain    Multiple Pain Sites No           Capillary Blood Glucose: No results found for this or any previous visit (from the past 24 hour(s)).    Social History   Tobacco Use  Smoking Status Never Smoker  Smokeless Tobacco Current User  . Types: Snuff  Tobacco Comment   12/05/2013 "use snuff q now and again"    Goals Met:  Independence with exercise equipment Exercise tolerated well No report of cardiac concerns or symptoms Strength training completed today  Goals Unmet:  Not Applicable  Comments: checkout time is 0915   Dr. Kathie Dike is Medical Director for [facility] Pulmonary Rehab.

## 2020-07-12 ENCOUNTER — Other Ambulatory Visit: Payer: Self-pay | Admitting: Physician Assistant

## 2020-07-13 NOTE — Progress Notes (Signed)
Discharge Progress Report  Patient Details  Name: Ryan Vaughn MRN: [id number] Date of Birth: 1951/05/12 Referring Provider:     Number of Visits: 29   Reason for Discharge:  Patient reached a stable level of exercise. Patient independent in their exercise. Patient has met program and personal goals.  Smoking History:  Social History   Tobacco Use  Smoking Status Never Smoker  Smokeless Tobacco Current User  . Types: Snuff  Tobacco Comment   12/05/2013 "use snuff q now and again"    Diagnosis:  NSTEMI (non-ST elevated myocardial infarction) ([place])  ADL [facility]:   Initial Exercise Prescription:  Initial Exercise Prescription - 04/14/20 1400      Date of Initial Exercise RX and Referring Provider   Date 04/14/20    Expected Discharge Date 07/07/20      Treadmill   MPH 2    Grade 0    Minutes 17      Recumbant Elliptical   Level 1    RPM 60    Minutes 22      Prescription Details   Duration Progress to 30 minutes of continuous aerobic without signs/symptoms of physical distress      Intensity   THRR 40-80% of Max Heartrate 61-122    Ratings of Perceived Exertion 11-13      Progression   Progression Continue progressive overload as per policy without signs/symptoms or physical distress.      Resistance Training   Training Prescription Yes    Weight 3    Reps 10-15           Discharge Exercise Prescription (Final Exercise Prescription Changes):  Exercise Prescription Changes - 06/28/20 1000      Response to Exercise   Blood Pressure (Admit) 110/70    Blood Pressure (Exercise) 156/70    Blood Pressure (Exit) 104/72    Heart Rate (Admit) 61 bpm    Heart Rate (Exercise) 106 bpm    Heart Rate (Exit) 70 bpm    Rating of Perceived Exertion (Exercise) 12    Duration Continue with 30 min of aerobic exercise without signs/symptoms of physical distress.    Intensity THRR unchanged      Progression   Progression Continue to progress workloads to maintain  intensity without signs/symptoms of physical distress.      Resistance Training   Training Prescription Yes    Weight 5 lbs    Reps 10-15    Time 10 Minutes      Treadmill   MPH 2.8    Grade 1.5    Minutes 17    METs 3.72      Recumbant Elliptical   Level 3    RPM 82    Minutes 22    METs 6.4           Functional Capacity:  6 Minute Walk    Row Name 04/14/20 1416 07/09/20 1030       6 Minute Walk   Phase Initial Discharge    Distance 1400 feet 1800 feet    Walk Time 6 minutes 6 minutes    # of Rest Breaks 0 0    MPH 2.96 3.41    METS 2.7 3.7    RPE 9 12    VO2 Peak 10.37 12.96    Symptoms Yes (comment) No    Comments Pt was experiencing chest tightness -    Resting HR 69 bpm 77 bpm    Resting BP 118/72 110/66  Resting Oxygen Saturation  97 % 97 %    Exercise Oxygen Saturation  during 6 min walk - 98 %    Max Ex. HR 84 bpm 88 bpm    Max Ex. BP 138/74 138/72    2 Minute Post BP 118/72 120/74           Psychological, QOL, Others - Outcomes: PHQ 2/9: Depression screen [facility] 2/9 07/13/2020 04/14/2020 01/20/2014  Decreased Interest 0 0 0  Down, Depressed, Hopeless 0 0 0  PHQ - 2 Score 0 0 0  Altered sleeping 0 0 -  Tired, decreased energy 0 1 -  Change in appetite 0 0 -  Feeling bad or failure about yourself  0 0 -  Trouble concentrating 0 0 -  Moving slowly or fidgety/restless 0 0 -  Suicidal thoughts 0 0 -  PHQ-9 Score 0 1 -  Difficult doing work/chores Not difficult at all Not difficult at all -    Quality of Life:  Quality of Life - 07/13/20 0850      Quality of Life   Select Quality of Life      Quality of Life Scores   Health/Function Pre 23.6 %    Health/Function Post 27.6 %    Health/Function % Change 16.95 %    Socioeconomic Pre 28.07 %    Socioeconomic Post 27.75 %    Socioeconomic % Change  -1.14 %    Psych/Spiritual Pre 30 %    Psych/Spiritual Post 30 %    Psych/Spiritual % Change 0 %    Family Pre 26.4 %    Family Post 30 %     Family % Change 13.64 %    GLOBAL Pre 26.25 %    GLOBAL Post 28.46 %    GLOBAL % Change 8.42 %           Personal Goals: Goals established at orientation with interventions provided to work toward goal.  Personal Goals and Risk Factors at Admission - 04/14/20 1416      Core Components/Risk Factors/Patient Goals on Admission    Weight Management Yes    Intervention Weight Management: Provide education and appropriate resources to help participant work on and attain dietary goals.;Weight Management/Obesity: Establish reasonable short term and long term weight goals.    Admit Weight 223 lb 14.4 oz (101.6 kg)    Goal Weight: Short Term 218 lb 14.4 oz (99.3 kg)    Goal Weight: Long Term 213 lb 9.6 oz (96.9 kg)    Expected Outcomes Long Term: Adherence to nutrition and physical activity/exercise program aimed toward attainment of established weight goal    Diabetes Yes    Intervention Provide education about signs/symptoms and action to take for hypo/hyperglycemia.;Provide education about proper nutrition, including hydration, and aerobic/resistive exercise prescription along with prescribed medications to achieve blood glucose in normal ranges: Fasting glucose 65-99 mg/dL    Expected Outcomes Short Term: Participant verbalizes understanding of the signs/symptoms and immediate care of hyper/hypoglycemia, proper foot care and importance of medication, aerobic/resistive exercise and nutrition plan for blood glucose control.;Long Term: Attainment of HbA1C < 7%.    Personal Goal Other Yes    Personal Goal Lose 15 to 20 lbs. Increase energy. Complete the program.    Intervention Patient will attend CR 3 days/week and supplement with exercise at home 2 days/week.    Expected Outcomes Patient will meet both program and personal goals.            Personal Goals  Discharge:  Goals and Risk Factor Review    Row Name 04/28/20 0745 05/26/20 9892 06/21/20 0845 07/13/20 1011       Core  Components/Risk Factors/Patient Goals Review   Personal Goals Review Weight Management/Obesity;Diabetes Weight Management/Obesity;Diabetes - Weight Management/Obesity;Diabetes    Review Patient has no started the program. He has been exposed to [place] and has been in [place]. He is scheduled to start today. His goals are to lose 15 to 20 lbs; increase energy and complete the program. Will continue to monitor for progress. Patient has completed 11 sessions maintaining his weight since his initial visit. He is doing well in the program with progression and consistent attendance. He did not get COVID due to his exposure. He last A1C on file was 12/18/19 at 6.8%. He does not monitor his glucose at home. His blood pressures are WNL all three readings. His personal goals are to lose 15-20 lbs; increase energy; and complete the program. Will continue to monitor for progress. Patient has completed 20 session losing 4.9 lbs since last 30 reveiw. He continues to do very well in the program with progress and consistent attendance. He has no recent A1C on file since 12/18/19 at 6.8%. All blood pressures are WNL. He says he is looking forward to [place] so he can get out and do more. He reports feeling stronger and having more energy. His personal goals are to lose 10 to 15 bls; increase energy; and complete the program. Will continue to monitor for progress. Patient has completed 29 exercise sessions. His goal was to lose weight. Based on his weight when he first started the program he has not lost weight, however his waist and hip circumference has decreased. Although he hasnt lost weight, he has decreased his body fat percentage, which indicates that he is gaining more muscle and losing fat. His blood pressure continues to be WNL. He plans to continue walking everyday and living an active lifestyle. He wants to keep progressing by getting stronger and keep improving his endurance. He reports having more energy. He plans to  get a part time job since he has completed the program.    Expected Outcomes Patient will complete the program meeting both program and personal goals. Patient will complete the program meeting both program and personal goals. Patient will complete the program meeting both program and personal goals. -           Exercise Goals and Review:  Exercise Goals    Row Name 04/14/20 1433 05/03/20 1159 05/31/20 1215 06/28/20 1027       Exercise Goals   Increase Physical Activity Yes Yes Yes Yes    Intervention Provide advice, education, support and counseling about physical activity/exercise needs.;Develop an individualized exercise prescription for aerobic and resistive training based on initial evaluation findings, risk stratification, comorbidities and participant's personal goals. Provide advice, education, support and counseling about physical activity/exercise needs.;Develop an individualized exercise prescription for aerobic and resistive training based on initial evaluation findings, risk stratification, comorbidities and participant's personal goals. Provide advice, education, support and counseling about physical activity/exercise needs.;Develop an individualized exercise prescription for aerobic and resistive training based on initial evaluation findings, risk stratification, comorbidities and participant's personal goals. Provide advice, education, support and counseling about physical activity/exercise needs.;Develop an individualized exercise prescription for aerobic and resistive training based on initial evaluation findings, risk stratification, comorbidities and participant's personal goals.    Expected Outcomes Short Term: Attend rehab on a regular basis to increase amount of physical activity.;Long  Term: Add in home exercise to make exercise part of routine and to increase amount of physical activity.;Long Term: Exercising regularly at least 3-5 days a week. Short Term: Attend rehab on a  regular basis to increase amount of physical activity.;Long Term: Add in home exercise to make exercise part of routine and to increase amount of physical activity.;Long Term: Exercising regularly at least 3-5 days a week. Short Term: Attend rehab on a regular basis to increase amount of physical activity.;Long Term: Add in home exercise to make exercise part of routine and to increase amount of physical activity.;Long Term: Exercising regularly at least 3-5 days a week. Short Term: Attend rehab on a regular basis to increase amount of physical activity.;Long Term: Add in home exercise to make exercise part of routine and to increase amount of physical activity.;Long Term: Exercising regularly at least 3-5 days a week.    Increase Strength and Stamina Yes Yes Yes Yes    Intervention Provide advice, education, support and counseling about physical activity/exercise needs.;Develop an individualized exercise prescription for aerobic and resistive training based on initial evaluation findings, risk stratification, comorbidities and participant's personal goals. Provide advice, education, support and counseling about physical activity/exercise needs.;Develop an individualized exercise prescription for aerobic and resistive training based on initial evaluation findings, risk stratification, comorbidities and participant's personal goals. Provide advice, education, support and counseling about physical activity/exercise needs.;Develop an individualized exercise prescription for aerobic and resistive training based on initial evaluation findings, risk stratification, comorbidities and participant's personal goals. Develop an individualized exercise prescription for aerobic and resistive training based on initial evaluation findings, risk stratification, comorbidities and participant's personal goals.;Provide advice, education, support and counseling about physical activity/exercise needs.    Expected Outcomes Short Term:  Increase workloads from initial exercise prescription for resistance, speed, and METs.;Short Term: Perform resistance training exercises routinely during rehab and add in resistance training at home;Long Term: Improve cardiorespiratory fitness, muscular endurance and strength as measured by increased METs and functional capacity (6MWT) Short Term: Increase workloads from initial exercise prescription for resistance, speed, and METs.;Short Term: Perform resistance training exercises routinely during rehab and add in resistance training at home;Long Term: Improve cardiorespiratory fitness, muscular endurance and strength as measured by increased METs and functional capacity (6MWT) Short Term: Increase workloads from initial exercise prescription for resistance, speed, and METs.;Short Term: Perform resistance training exercises routinely during rehab and add in resistance training at home;Long Term: Improve cardiorespiratory fitness, muscular endurance and strength as measured by increased METs and functional capacity (6MWT) Short Term: Increase workloads from initial exercise prescription for resistance, speed, and METs.;Short Term: Perform resistance training exercises routinely during rehab and add in resistance training at home;Long Term: Improve cardiorespiratory fitness, muscular endurance and strength as measured by increased METs and functional capacity (6MWT)    Able to understand and use rate of perceived exertion (RPE) scale Yes Yes Yes Yes    Intervention Provide education and explanation on how to use RPE scale Provide education and explanation on how to use RPE scale Provide education and explanation on how to use RPE scale Provide education and explanation on how to use RPE scale    Expected Outcomes Short Term: Able to use RPE daily in rehab to express subjective intensity level;Long Term:  Able to use RPE to guide intensity level when exercising independently Short Term: Able to use RPE daily in  rehab to express subjective intensity level;Long Term:  Able to use RPE to guide intensity level when exercising independently Short Term:  Able to use RPE daily in rehab to express subjective intensity level;Long Term:  Able to use RPE to guide intensity level when exercising independently Short Term: Able to use RPE daily in rehab to express subjective intensity level;Long Term:  Able to use RPE to guide intensity level when exercising independently    Knowledge and understanding of Target Heart Rate Range (THRR) Yes Yes Yes Yes    Intervention Provide education and explanation of THRR including how the numbers were predicted and where they are located for reference Provide education and explanation of THRR including how the numbers were predicted and where they are located for reference Provide education and explanation of THRR including how the numbers were predicted and where they are located for reference Provide education and explanation of THRR including how the numbers were predicted and where they are located for reference    Expected Outcomes Short Term: Able to state/look up THRR;Long Term: Able to use THRR to govern intensity when exercising independently;Short Term: Able to use daily as guideline for intensity in rehab Short Term: Able to state/look up THRR;Long Term: Able to use THRR to govern intensity when exercising independently;Short Term: Able to use daily as guideline for intensity in rehab Short Term: Able to state/look up THRR;Long Term: Able to use THRR to govern intensity when exercising independently;Short Term: Able to use daily as guideline for intensity in rehab Short Term: Able to state/look up THRR;Long Term: Able to use THRR to govern intensity when exercising independently;Short Term: Able to use daily as guideline for intensity in rehab    Able to check pulse independently Yes Yes Yes Yes    Intervention Provide education and demonstration on how to check pulse in carotid and  radial arteries.;Review the importance of being able to check your own pulse for safety during independent exercise Provide education and demonstration on how to check pulse in carotid and radial arteries.;Review the importance of being able to check your own pulse for safety during independent exercise Provide education and demonstration on how to check pulse in carotid and radial arteries.;Review the importance of being able to check your own pulse for safety during independent exercise Provide education and demonstration on how to check pulse in carotid and radial arteries.;Review the importance of being able to check your own pulse for safety during independent exercise    Expected Outcomes Short Term: Able to explain why pulse checking is important during independent exercise;Long Term: Able to check pulse independently and accurately Short Term: Able to explain why pulse checking is important during independent exercise;Long Term: Able to check pulse independently and accurately Short Term: Able to explain why pulse checking is important during independent exercise;Long Term: Able to check pulse independently and accurately Short Term: Able to explain why pulse checking is important during independent exercise;Long Term: Able to check pulse independently and accurately    Understanding of Exercise Prescription Yes Yes Yes Yes    Intervention Provide education, explanation, and written materials on patient's individual exercise prescription Provide education, explanation, and written materials on patient's individual exercise prescription Provide education, explanation, and written materials on patient's individual exercise prescription Provide education, explanation, and written materials on patient's individual exercise prescription    Expected Outcomes Short Term: Able to explain program exercise prescription;Long Term: Able to explain home exercise prescription to exercise independently Short Term: Able  to explain program exercise prescription;Long Term: Able to explain home exercise prescription to exercise independently Short Term: Able to explain program exercise prescription;Long  Term: Able to explain home exercise prescription to exercise independently Short Term: Able to explain program exercise prescription;Long Term: Able to explain home exercise prescription to exercise independently           Exercise Goals Re-Evaluation:  Exercise Goals Re-Evaluation    Row Name 05/03/20 1200 05/31/20 1216 06/28/20 1024         Exercise Goal Re-Evaluation   Exercise Goals Review Increase Physical Activity;Increase Strength and Stamina;Able to understand and use rate of perceived exertion (RPE) scale;Knowledge and understanding of Target Heart Rate Range (THRR);Able to check pulse independently;Understanding of Exercise Prescription Increase Physical Activity;Increase Strength and Stamina;Able to understand and use rate of perceived exertion (RPE) scale;Knowledge and understanding of Target Heart Rate Range (THRR);Able to check pulse independently;Understanding of Exercise Prescription Increase Physical Activity;Increase Strength and Stamina;Able to understand and use rate of perceived exertion (RPE) scale;Knowledge and understanding of Target Heart Rate Range (THRR);Able to check pulse independently;Understanding of Exercise Prescription     Comments Pt has attended 3 exercise sessions. He is motivated to exercise and shows a motivated outlook. He currently exercises at 4.0 METs on the elliptical. Will continue to monitor and progress as able. Pt has attended 13 exercise sessions. He is progressing quickly and is continuously increasing his workloads. He is highly motivated and has been walking at home on his off days from rehab. He crrently exercises at 5.0 METs on the elliptical. Will continue to monitor and progress as able. Patient has completed 24 exercise sessions. He is progressing very quickly and  is increasing the workload. He enjoys coming to rehab and is highly motivated to get better. He is currently exercising at 6.4 METs on th elliptical. He is progressing his home exercise as well by walking longer. Will continue to progress as able.     Expected Outcomes Through exercise at rehab and by engaging in a home exercise program the patient will reach their goals. Through exercise at rehab and by engaging in a home exercise program the patient will reach their goals. Through exercise at rehab and a home exercise program, patient will achieve their goals.            Nutrition & Weight - Outcomes:  Pre Biometrics - 07/09/20 1030      Pre Biometrics   Weight 100.7 kg    Waist Circumference 41.5 inches    Hip Circumference 41.5 inches    Waist to Hip Ratio 1 %    Triceps Skinfold 10 mm    % Body Fat 27.4 %    Grip Strength 41.6 kg    Flexibility 10 in    Single Leg Stand 60 seconds            Nutrition:  Nutrition Therapy & Goals - 06/21/20 0845      Personal Nutrition Goals   Comments We will continue to provide education through handouts.      Intervention Plan   Intervention Nutrition handout(s) given to patient.           Nutrition Discharge:  Nutrition Assessments - 07/13/20 0851      MEDFICTS Scores   Pre Score 59    Post Score 42    Score Difference -17           Education Questionnaire Score:  Knowledge Questionnaire Score - 07/13/20 0851      Knowledge Questionnaire Score   Pre Score 23/28    Post Score 22/24  Patient graduated from [place] today on 07-09-20 after completing 29 sessions. They achieved LTG of 30 minutes of aerobic exercise at Max Met level of 3.7 METs. All patients vitals are WNL. Discharge instruction has been reviewed in detail and patient stated an understanding of material given. Patient plans to exercise at home. Cardiac Rehab staff will make f/u call. Patient had no complaints of any  abnormal S/S or pain on their exit visit.     Goals reviewed with patient; copy given to patient.

## 2020-09-02 DIAGNOSIS — Z125 Encounter for screening for malignant neoplasm of prostate: Secondary | ICD-10-CM | POA: Diagnosis not present

## 2020-09-02 DIAGNOSIS — E7801 Familial hypercholesterolemia: Secondary | ICD-10-CM | POA: Diagnosis not present

## 2020-09-02 DIAGNOSIS — E1169 Type 2 diabetes mellitus with other specified complication: Secondary | ICD-10-CM | POA: Diagnosis not present

## 2020-09-02 DIAGNOSIS — I10 Essential (primary) hypertension: Secondary | ICD-10-CM | POA: Diagnosis not present

## 2020-09-02 DIAGNOSIS — E782 Mixed hyperlipidemia: Secondary | ICD-10-CM | POA: Diagnosis not present

## 2020-09-02 DIAGNOSIS — Z8572 Personal history of non-Hodgkin lymphomas: Secondary | ICD-10-CM | POA: Diagnosis not present

## 2020-09-02 DIAGNOSIS — Z1329 Encounter for screening for other suspected endocrine disorder: Secondary | ICD-10-CM | POA: Diagnosis not present

## 2020-09-02 DIAGNOSIS — E7849 Other hyperlipidemia: Secondary | ICD-10-CM | POA: Diagnosis not present

## 2020-09-02 DIAGNOSIS — E7800 Pure hypercholesterolemia, unspecified: Secondary | ICD-10-CM | POA: Diagnosis not present

## 2020-09-02 DIAGNOSIS — E559 Vitamin D deficiency, unspecified: Secondary | ICD-10-CM | POA: Diagnosis not present

## 2020-09-07 DIAGNOSIS — Z0001 Encounter for general adult medical examination with abnormal findings: Secondary | ICD-10-CM | POA: Diagnosis not present

## 2020-09-07 DIAGNOSIS — C8330 Diffuse large B-cell lymphoma, unspecified site: Secondary | ICD-10-CM | POA: Diagnosis not present

## 2020-09-07 DIAGNOSIS — E1169 Type 2 diabetes mellitus with other specified complication: Secondary | ICD-10-CM | POA: Diagnosis not present

## 2020-09-07 DIAGNOSIS — I2510 Atherosclerotic heart disease of native coronary artery without angina pectoris: Secondary | ICD-10-CM | POA: Diagnosis not present

## 2020-09-07 DIAGNOSIS — D126 Benign neoplasm of colon, unspecified: Secondary | ICD-10-CM | POA: Diagnosis not present

## 2020-09-07 DIAGNOSIS — I10 Essential (primary) hypertension: Secondary | ICD-10-CM | POA: Diagnosis not present

## 2020-09-07 DIAGNOSIS — Z955 Presence of coronary angioplasty implant and graft: Secondary | ICD-10-CM | POA: Diagnosis not present

## 2020-09-07 DIAGNOSIS — E7849 Other hyperlipidemia: Secondary | ICD-10-CM | POA: Diagnosis not present

## 2020-09-30 DIAGNOSIS — R059 Cough, unspecified: Secondary | ICD-10-CM | POA: Diagnosis not present

## 2020-09-30 DIAGNOSIS — Z20828 Contact with and (suspected) exposure to other viral communicable diseases: Secondary | ICD-10-CM | POA: Diagnosis not present

## 2020-10-09 ENCOUNTER — Other Ambulatory Visit: Payer: Self-pay | Admitting: Physician Assistant

## 2020-10-12 ENCOUNTER — Other Ambulatory Visit: Payer: Self-pay | Admitting: Physician Assistant

## 2020-11-13 ENCOUNTER — Other Ambulatory Visit: Payer: Self-pay | Admitting: Physician Assistant

## 2020-12-30 ENCOUNTER — Other Ambulatory Visit: Payer: Self-pay | Admitting: Family Medicine

## 2020-12-30 ENCOUNTER — Telehealth: Payer: Self-pay | Admitting: Cardiology

## 2020-12-30 DIAGNOSIS — Z23 Encounter for immunization: Secondary | ICD-10-CM | POA: Diagnosis not present

## 2020-12-30 MED ORDER — CLOPIDOGREL BISULFATE 75 MG PO TABS: 75.0000 mg | ORAL_TABLET | Freq: Every day | ORAL | 0 refills | Status: DC

## 2020-12-30 NOTE — Telephone Encounter (Signed)
Please send refill on Plavix to [facility] in [place] [address]

## 2020-12-30 NOTE — Telephone Encounter (Signed)
Done

## 2021-01-16 NOTE — Progress Notes (Signed)
Cardiology Office Note  Date: 01/17/2021   ID: Ryan, Vaughn January 29, 1951, MRN [id number]  PCP:  Ryan Labrum, MD  Cardiologist:  Ryan Dolly, MD Electrophysiologist:  None   Chief Complaint: 60-monthfollow-up  History of Present Illness: Ryan Vaughn a 70y.o. male with a history of CAD, DM2, HTN, kidney stones, arthritis, HLD, non-Hodgkin's lymphoma, MI   CAD status post single-vessel CABG (LIMA-LAD).  Multiple PCI and RCA and LCx.  Cardiac catheterization July 2020 revealed patent LIMA to LAD and patent stents in RCA and circumflex.  Admission on 12/09/2019 with NSTEMI.  Cardiac catheterization 2021 LM normal, proximal LAD occluded, LCx occluded, RCA patent, patent LIMA-LAD graft, NSTEMI due to occlusion of LCx.  Not salvageable and recommended medical management.  Echo with LVEF of 50 to 55% and grade 2 DD. .Marland KitchenHe was last seen by Dr. BHarl Bowieon 04/01/2020.  He he had been experiencing headaches on Imdur.  He was started on Ranexa as an inpatient. Had chest pains at times but overall not bothersome.  He was compliant with medication.  He was starting cardiac rehab later in the month.  He was compliant with his statin medications.  His LDL was 55 on October 2021.  He had no significant CAD symptoms.  He was continuing current medications.  Medical therapy was limited by soft BPs.  Not on ACE/ARB.  If recurrent chest pains we will try increasing Ranexa.  LDL was at goal.  Discussed dietary changes to improve triglycerides.  He returns today for routine follow-up.  He states he had COVID in the interim since last visit in May.  States he caught it from his grandson.  Otherwise he denies any issues.  States he is working part-time at [name]  Denies any anginal or exertional symptoms, orthostatic symptoms other than mild transient dizziness when he bends over and attempts to stand erect.  Denies any palpitations or arrhythmias, CVA  or TIA-like symptoms, bleeding issues, PND, orthopnea.  Denies any claudication-like symptoms, DVT or PE-like symptoms.  He states she needs refills on nitroglycerin, clopidogrel, and ranolazine.  States he has occasional cramps in his feet but otherwise no myalgias.  States he recently had lab work at his primary care provider and everything was normal.  EKG today shows normal sinus rhythm with sinus arrhythmia rate of 63.  Past Medical History:  Diagnosis Date   Arthritis    CAD (coronary artery disease)    a. DES to OM 1 and DES mLCX (12/05/13) b. CABG x1  ('92) c. 6 stents placed in 2009? (pt hx)    Diabetes mellitus without complication ([place]    type 2   Essential hypertension, benign    Heart murmur    as a child    History of kidney stones    Lumbago    Mixed hyperlipidemia    Myocardial infarction (Ryan Vaughn age 70  [place]kin's [place]   a. stage IV, chemo done   Personal history of colonic polyps     Past Surgical History:  Procedure Laterality Date   CARDIAC CATHETERIZATION N/A 07/16/2015   Procedure: Left Heart Cath and Cors/Grafts Angiography;  Surgeon: JJettie Booze MD;  [facility];  Service: Cardiovascular;  Laterality: N/A;   CARDIAC CATHETERIZATION N/A 07/16/2015   Procedure: Coronary Stent Intervention;  Surgeon: JJettie Booze MD;  [place] [facility];  Service: Cardiovascular;  Laterality:  N/A;   CHOLECYSTECTOMY  08/2000   CORONARY ANGIOPLASTY WITH STENT PLACEMENT  ~ 1999; ~ 2009 X 2; 12/05/2013   "1 +3 + 2 + 2" ( total of 8 or 9 stents)   CORONARY ARTERY BYPASS GRAFT  1992   "CABG X1"   CYSTOSCOPY W/ URETERAL STENT PLACEMENT Right 09/12/2019   Procedure: CYSTOSCOPY WITH RETROGRADE PYELOGRAM/URETERAL STENT PLACEMENT;  Surgeon: Ceasar Mons, MD;  [facility];  Service: Urology;  Laterality: Right;   CYSTOSCOPY/URETEROSCOPY/HOLMIUM LASER/STENT PLACEMENT Right 10/01/2019   Procedure:  CYSTOSCOPY/URETEROSCOPY/HOLMIUM LASER/STENT PLACEMENT;  Surgeon: Ceasar Mons, MD;  Location: [facility];  Service: Urology;  Laterality: Right;   EXPLORATORY LAPAROTOMY WITH ABDOMINAL MASS EXCISION  08/2000   "looking for cancer"   [place] CATH AND CORS/GRAFTS ANGIOGRAPHY N/A 12/16/2018   Procedure: LEFT HEART CATH AND CORS/GRAFTS ANGIOGRAPHY;  Surgeon: Sherren Mocha, MD;  Location: [place] [facility];  Service: Cardiovascular;  Laterality: N/A;   LEFT HEART CATH AND CORS/GRAFTS ANGIOGRAPHY N/A 12/19/2019   Procedure: LEFT HEART CATH AND CORS/GRAFTS ANGIOGRAPHY;  Surgeon: Martinique, Peter M, MD;  Location: [place] [facility];  Service: Cardiovascular;  Laterality: N/A;   LEFT HEART CATHETERIZATION WITH CORONARY ANGIOGRAM N/A 12/05/2013   Procedure: LEFT HEART CATHETERIZATION WITH CORONARY ANGIOGRAM;  Surgeon: Blane Ohara, MD;  Location: [facility];  Service: Cardiovascular;  Laterality: N/A;   PERCUTANEOUS CORONARY STENT INTERVENTION (PCI-S)  12/05/2013   Procedure: PERCUTANEOUS CORONARY STENT INTERVENTION (PCI-S);  Surgeon: Blane Ohara, MD;  Location: [facility] C[facility];  Service: Cardiovascular;;  mid circumflex and prox OM2   TONSILLECTOMY      Current Outpatient Medications  Medication Sig Dispense Refill   aspirin EC 81 MG EC tablet Take 1 tablet (81 mg total) by mouth daily. Swallow whole. 30 tablet 11   metFORMIN (GLUCOPHAGE) 500 MG tablet Take 500 mg by mouth 2 (two) times daily with a meal.     metoprolol succinate (TOPROL-XL) 50 MG 24 hr tablet Take 50 mg by mouth daily.     rosuvastatin (CRESTOR) 40 MG tablet TAKE 1 TABLET(40 MG) BY MOUTH DAILY 90 tablet 3   clopidogrel (PLAVIX) 75 MG tablet Take 1 tablet (75 mg total) by mouth daily. 90 tablet 3   nitroGLYCERIN (NITROSTAT) 0.4 MG SL tablet Place 1 tablet (0.4 mg total) under the tongue every 5 (five) minutes x 3 doses as needed for chest pain (if no relief after 2nd dose,  proceed to the ED for an evaluation or call 911). 25 tablet 3   ranolazine (RANEXA) 500 MG 12 hr tablet TAKE 1 TABLET(500 MG) BY MOUTH TWICE DAILY 90 tablet 3   No current facility-administered medications for this visit.   Allergies:  Imdur [isosorbide nitrate] and Ivp dye [iodinated diagnostic agents]   Social History: The patient  reports that he has never smoked. His smokeless tobacco use includes snuff. He reports that he does not currently use alcohol. He reports that he does not use drugs.   Family History: The patient's family history includes Breast cancer in his mother; Colon cancer in his father.   ROS:  Please see the history of present illness. Otherwise, complete review of systems is positive for none.  All other systems are reviewed and negative.   Physical Exam: VS:  BP 110/80   Pulse 72   Ht '5\' 10"'$  (1.778 m)   Wt 231 lb 9.6 oz (105.1 kg)   SpO2  98%   BMI 33.23 kg/m , BMI Body mass index is 33.23 kg/m.  Wt Readings from Last 3 Encounters:  01/17/21 231 lb 9.6 oz (105.1 kg)  07/09/20 222 lb 0.1 oz (100.7 kg)  06/28/20 221 lb 12.5 oz (100.6 kg)    General: Patient appears comfortable at rest. Neck: Supple, no elevated JVP or carotid bruits, no thyromegaly. Lungs: Clear to auscultation, nonlabored breathing at rest. Cardiac: Regular rate and rhythm, no S3 or significant systolic murmur, no pericardial rub. Extremities: No pitting edema, distal pulses 2+. Skin: Warm and dry. Musculoskeletal: No kyphosis. Neuropsychiatric: Alert and oriented x3, affect grossly appropriate.  ECG: 829 EKG normal sinus rhythm with sinus arrhythmia rate of 63  Recent Labwork: No results found for requested labs within last 8760 hours.     Component Value Date/Time   CHOL 104 12/19/2019 0542   TRIG 326 (H) 12/19/2019 0542   HDL 25 (L) 12/19/2019 0542   CHOLHDL 4.2 12/19/2019 0542   VLDL 65 (H) 12/19/2019 0542   [place] 14 12/19/2019 0542    Other Studies Reviewed  Today:  Cardiac catheterization 12/19/2019 Conclusion    Prox LAD lesion is 100% stenosed. Previously placed 1st Mrg stent (unknown type) is widely patent. Prox Cx to Dist Cx lesion is 100% stenosed. Previously placed Prox RCA to Mid RCA stent (unknown type) is widely patent. LIMA graft was visualized by angiography and is normal in caliber. The graft exhibits no disease. The left ventricular systolic function is normal. LV end diastolic pressure is mildly elevated. The left ventricular ejection fraction is 55-65% by visual estimate.   1. 2 vessel occlusive CAD    - 100% proximal LAD    - 100% mid LCx. This vessel had multiple prior stents 2. Continued patency of stents in OM1 and the RCA 3. Patent LIMA to the LAD 4. Normal LV function 5. Mildly elevated LVEDP   Plan: NSTEMI is due to occlusion of the mid LCx. This is not salvageable. Continue medical therapy   Diagnostic Dominance: Right      Echocardiogram 12/19/2019  1. Left ventricular ejection fraction, by estimation, is 50 to 55%. The left ventricle has low normal function. The left ventricle has no regional wall motion abnormalities. Left ventricular diastolic parameters are consistent with Grade II diastolic dysfunction (pseudonormalization). 2. Right ventricular systolic function is normal. The right ventricular size is normal. There is normal pulmonary artery systolic pressure. The estimated right ventricular systolic pressure is [id number] mmHg. 3. Left atrial size was mildly dilated. 4. The mitral valve is normal in structure. No evidence of mitral valve regurgitation. No evidence of mitral stenosis. 5. The aortic valve is normal in structure. Aortic valve regurgitation is not visualized. No aortic stenosis is present. 6. The inferior vena cava is normal in size with greater than 50% respiratory variability, suggesting right atrial pressure of 3 mmHg.  Assessment and Plan:  1. CAD in native artery   2. Mixed  hyperlipidemia    1. CAD in native artery Denies any anginal or exertional symptoms.  States he is working part-time in the [employer] at [employer] assisted living without any anginal or exertional symptoms, DOE or SOB. Continue aspirin 81 mg daily.  Continue Plavix 75 mg daily, continue Toprol-XL 50 mg daily.  Continue sublingual nitroglycerin as needed, continue Ranexa 500 mg p.o. twice daily.  2. Mixed hyperlipidemia Continue Crestor 40 mg p.o. daily.  Medication Adjustments/Labs and Tests Ordered: Current medicines are reviewed at length with the patient  today.  Concerns regarding medicines are outlined above.   Disposition: Follow-up with Dr. Harl Vaughn or APP 6 months  Signed, Levell July, NP 01/17/2021 8:33 AM    [facility] [place], [place], [place] [address] Phone: [phone]; Fax: [phone]

## 2021-01-17 ENCOUNTER — Other Ambulatory Visit: Payer: Self-pay

## 2021-01-17 ENCOUNTER — Encounter: Payer: Self-pay | Admitting: Family Medicine

## 2021-01-17 ENCOUNTER — Other Ambulatory Visit: Payer: Self-pay | Admitting: *Deleted

## 2021-01-17 ENCOUNTER — Encounter: Payer: Self-pay | Admitting: *Deleted

## 2021-01-17 ENCOUNTER — Ambulatory Visit (INDEPENDENT_AMBULATORY_CARE_PROVIDER_SITE_OTHER): Payer: Medicare Other | Admitting: Family Medicine

## 2021-01-17 VITALS — BP 110/80 | HR 72 | Ht 70.0 in | Wt 231.6 lb

## 2021-01-17 DIAGNOSIS — I2510 Atherosclerotic heart disease of native coronary artery without angina pectoris: Secondary | ICD-10-CM

## 2021-01-17 DIAGNOSIS — E782 Mixed hyperlipidemia: Secondary | ICD-10-CM

## 2021-01-17 MED ORDER — RANOLAZINE ER 500 MG PO TB12
ORAL_TABLET | ORAL | 3 refills | Status: DC
Start: 2021-01-17 — End: 2021-01-17

## 2021-01-17 MED ORDER — CLOPIDOGREL BISULFATE 75 MG PO TABS
75.0000 mg | ORAL_TABLET | Freq: Every day | ORAL | 3 refills | Status: DC
Start: 2021-01-17 — End: 2021-01-17

## 2021-01-17 MED ORDER — RANOLAZINE ER 500 MG PO TB12: ORAL_TABLET | ORAL | 3 refills | Status: DC

## 2021-01-17 MED ORDER — NITROGLYCERIN 0.4 MG SL SUBL: 0.4000 mg | SUBLINGUAL_TABLET | SUBLINGUAL | 3 refills | Status: DC | PRN

## 2021-01-17 MED ORDER — CLOPIDOGREL BISULFATE 75 MG PO TABS: 75.0000 mg | ORAL_TABLET | Freq: Every day | ORAL | 3 refills | Status: DC

## 2021-01-17 NOTE — Patient Instructions (Signed)

## 2021-03-10 DIAGNOSIS — C8330 Diffuse large B-cell lymphoma, unspecified site: Secondary | ICD-10-CM | POA: Diagnosis not present

## 2021-03-10 DIAGNOSIS — B351 Tinea unguium: Secondary | ICD-10-CM | POA: Diagnosis not present

## 2021-03-10 DIAGNOSIS — Z23 Encounter for immunization: Secondary | ICD-10-CM | POA: Diagnosis not present

## 2021-03-10 DIAGNOSIS — E1169 Type 2 diabetes mellitus with other specified complication: Secondary | ICD-10-CM | POA: Diagnosis not present

## 2021-03-10 DIAGNOSIS — E782 Mixed hyperlipidemia: Secondary | ICD-10-CM | POA: Diagnosis not present

## 2021-03-10 DIAGNOSIS — Z6832 Body mass index (BMI) 32.0-32.9, adult: Secondary | ICD-10-CM | POA: Diagnosis not present

## 2021-03-10 DIAGNOSIS — I10 Essential (primary) hypertension: Secondary | ICD-10-CM | POA: Diagnosis not present

## 2021-03-10 DIAGNOSIS — Z955 Presence of coronary angioplasty implant and graft: Secondary | ICD-10-CM | POA: Diagnosis not present

## 2021-03-10 DIAGNOSIS — D126 Benign neoplasm of colon, unspecified: Secondary | ICD-10-CM | POA: Diagnosis not present

## 2021-03-10 DIAGNOSIS — I2510 Atherosclerotic heart disease of native coronary artery without angina pectoris: Secondary | ICD-10-CM | POA: Diagnosis not present

## 2021-03-10 DIAGNOSIS — Z8572 Personal history of non-Hodgkin lymphomas: Secondary | ICD-10-CM | POA: Diagnosis not present

## 2021-04-19 ENCOUNTER — Other Ambulatory Visit: Payer: Self-pay | Admitting: Physician Assistant

## 2021-05-12 DIAGNOSIS — Z20828 Contact with and (suspected) exposure to other viral communicable diseases: Secondary | ICD-10-CM | POA: Diagnosis not present

## 2021-05-12 DIAGNOSIS — J069 Acute upper respiratory infection, unspecified: Secondary | ICD-10-CM | POA: Diagnosis not present

## 2021-06-24 DIAGNOSIS — Z23 Encounter for immunization: Secondary | ICD-10-CM | POA: Diagnosis not present

## 2021-07-13 ENCOUNTER — Other Ambulatory Visit: Payer: Self-pay

## 2021-07-13 MED ORDER — RANOLAZINE ER 500 MG PO TB12: ORAL_TABLET | ORAL | 1 refills | Status: DC

## 2021-08-15 DIAGNOSIS — J029 Acute pharyngitis, unspecified: Secondary | ICD-10-CM | POA: Diagnosis not present

## 2021-08-15 DIAGNOSIS — J209 Acute bronchitis, unspecified: Secondary | ICD-10-CM | POA: Diagnosis not present

## 2021-08-15 DIAGNOSIS — Z20828 Contact with and (suspected) exposure to other viral communicable diseases: Secondary | ICD-10-CM | POA: Diagnosis not present

## 2021-08-15 DIAGNOSIS — J0100 Acute maxillary sinusitis, unspecified: Secondary | ICD-10-CM | POA: Diagnosis not present

## 2021-09-08 DIAGNOSIS — Z955 Presence of coronary angioplasty implant and graft: Secondary | ICD-10-CM | POA: Diagnosis not present

## 2021-09-08 DIAGNOSIS — E7800 Pure hypercholesterolemia, unspecified: Secondary | ICD-10-CM | POA: Diagnosis not present

## 2021-09-08 DIAGNOSIS — E1169 Type 2 diabetes mellitus with other specified complication: Secondary | ICD-10-CM | POA: Diagnosis not present

## 2021-09-08 DIAGNOSIS — E7849 Other hyperlipidemia: Secondary | ICD-10-CM | POA: Diagnosis not present

## 2021-09-08 DIAGNOSIS — Z1329 Encounter for screening for other suspected endocrine disorder: Secondary | ICD-10-CM | POA: Diagnosis not present

## 2021-09-08 DIAGNOSIS — E7801 Familial hypercholesterolemia: Secondary | ICD-10-CM | POA: Diagnosis not present

## 2021-09-08 DIAGNOSIS — E782 Mixed hyperlipidemia: Secondary | ICD-10-CM | POA: Diagnosis not present

## 2021-09-08 DIAGNOSIS — I10 Essential (primary) hypertension: Secondary | ICD-10-CM | POA: Diagnosis not present

## 2021-09-08 DIAGNOSIS — Z8572 Personal history of non-Hodgkin lymphomas: Secondary | ICD-10-CM | POA: Diagnosis not present

## 2021-09-12 DIAGNOSIS — Z955 Presence of coronary angioplasty implant and graft: Secondary | ICD-10-CM | POA: Diagnosis not present

## 2021-09-12 DIAGNOSIS — G4762 Sleep related leg cramps: Secondary | ICD-10-CM | POA: Diagnosis not present

## 2021-09-12 DIAGNOSIS — Z0001 Encounter for general adult medical examination with abnormal findings: Secondary | ICD-10-CM | POA: Diagnosis not present

## 2021-09-12 DIAGNOSIS — E1169 Type 2 diabetes mellitus with other specified complication: Secondary | ICD-10-CM | POA: Diagnosis not present

## 2021-09-12 DIAGNOSIS — Z23 Encounter for immunization: Secondary | ICD-10-CM | POA: Diagnosis not present

## 2021-09-12 DIAGNOSIS — D126 Benign neoplasm of colon, unspecified: Secondary | ICD-10-CM | POA: Diagnosis not present

## 2021-09-12 DIAGNOSIS — Z8572 Personal history of non-Hodgkin lymphomas: Secondary | ICD-10-CM | POA: Diagnosis not present

## 2021-09-12 DIAGNOSIS — I10 Essential (primary) hypertension: Secondary | ICD-10-CM | POA: Diagnosis not present

## 2021-10-07 ENCOUNTER — Encounter: Payer: Self-pay | Admitting: Cardiology

## 2021-10-07 ENCOUNTER — Ambulatory Visit (INDEPENDENT_AMBULATORY_CARE_PROVIDER_SITE_OTHER): Payer: Medicare Other | Admitting: Cardiology

## 2021-10-07 ENCOUNTER — Encounter: Payer: Self-pay | Admitting: *Deleted

## 2021-10-07 VITALS — BP 100/72 | HR 51 | Ht 71.0 in | Wt 228.8 lb

## 2021-10-07 DIAGNOSIS — Z951 Presence of aortocoronary bypass graft: Secondary | ICD-10-CM

## 2021-10-07 DIAGNOSIS — E782 Mixed hyperlipidemia: Secondary | ICD-10-CM | POA: Diagnosis not present

## 2021-10-07 DIAGNOSIS — I2510 Atherosclerotic heart disease of native coronary artery without angina pectoris: Secondary | ICD-10-CM | POA: Diagnosis not present

## 2021-10-07 MED ORDER — EZETIMIBE 10 MG PO TABS: 10.0000 mg | ORAL_TABLET | Freq: Every day | ORAL | 6 refills | Status: DC

## 2021-10-07 NOTE — Progress Notes (Signed)
Clinical Summary Ryan Vaughn is a 71 y.o.male seen today for follow up of the following medical problems.    1. CAD - h/o CAD s/p single-vessel CABG (LIMA-LAD), multiple PCI in the RCA and LCx territories,  - 11/2018 cath revealed patent LIMA to the LAD and patent stents in the RCA and circumflex.   - admission 11/2019 with NSTEMI.  - 11/2019 cath LM normal, prox LAD occluded, LCX occluded, RCA patent. Patent LIMA-LAD graft. NSTEMI due to occlusion of LCX, not salvageable and recs for medical management - echo LVEF 50-55%, grade II DDx     - headaches on imdur - started on ranexa as inpatient - slight chest pains at times, overall not bothersome.  - compliant with meds - starting cardiac rehab later this month   - continue DAPT in setting of extensive CAD hsitory - isolated episode of dizziness.     2. 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 DES to OM 1 and DES mLCX (12/05/13) b. CABG x1  ('92) c. 6 stents placed in 2009? (pt hx)    Diabetes mellitus without complication ([place])    type 2   Essential hypertension, benign    Heart murmur    as a child    History of kidney stones    Lumbago    Mixed hyperlipidemia    Myocardial infarction [facility]) age 31   Non Hodgkin's lymphoma ([place]) 2002   a. stage IV, chemo done   Personal history of colonic polyps      Allergies  Allergen Reactions   Imdur [Isosorbide Nitrate] Diarrhea and Other (See Comments)    Headache, diarrhea   Ivp Dye [Iodinated Contrast Media] Other (See Comments)    headache     Current Outpatient Medications  Medication Sig Dispense Refill   aspirin EC 81 MG EC tablet Take 1 tablet (81 mg total) by mouth daily. Swallow whole. 30 tablet 11   clopidogrel (PLAVIX) 75 MG tablet Take 1 tablet  (75 mg total) by mouth daily. 90 tablet 3   metFORMIN (GLUCOPHAGE) 500 MG tablet Take 500 mg by mouth 2 (two) times daily with a meal.     metoprolol succinate (TOPROL-XL) 50 MG 24 hr tablet Take 50 mg by mouth daily.     nitroGLYCERIN (NITROSTAT) 0.4 MG SL tablet Place 1 tablet (0.4 mg total) under the tongue every 5 (five) minutes x 3 doses as needed for chest pain (if no relief after 2nd dose, proceed to the ED for an evaluation or call 911). 25 tablet 3   ranolazine (RANEXA) 500 MG 12 hr tablet TAKE 1 TABLET(500 MG) BY MOUTH TWICE DAILY 90 tablet 1   rosuvastatin (CRESTOR) 40 MG tablet TAKE 1 TABLET(40 MG) BY MOUTH DAILY 90 tablet 1   No current facility-administered medications for this visit.     Past Surgical History:  Procedure Laterality Date   CARDIAC CATHETERIZATION N/A 07/16/2015   Procedure: Left Heart Cath and Cors/Grafts Angiography;  Surgeon: Jettie Booze, MD;  Location: [place] [facility];  Service: Cardiovascular;  Laterality: N/A;   CARDIAC CATHETERIZATION N/A 07/16/2015   Procedure: Coronary Stent Intervention;  Surgeon: Jettie Booze, MD;  Location: [place]  [facility];  Service: Cardiovascular;  Laterality: N/A;   CHOLECYSTECTOMY  08/2000   CORONARY ANGIOPLASTY WITH STENT PLACEMENT  ~ 1999; ~ 2009 X 2; 12/05/2013   "1 +3 + 2 + 2" ( total of 8 or 9 stents)   CORONARY ARTERY BYPASS GRAFT  1992   "CABG X1"   CYSTOSCOPY W/ URETERAL STENT PLACEMENT Right 09/12/2019   Procedure: CYSTOSCOPY WITH RETROGRADE PYELOGRAM/URETERAL STENT PLACEMENT;  Surgeon: Ceasar Mons, MD;  [facility];  Service: Urology;  Laterality: Right;   CYSTOSCOPY/URETEROSCOPY/HOLMIUM LASER/STENT PLACEMENT Right 10/01/2019   Procedure: CYSTOSCOPY/URETEROSCOPY/HOLMIUM LASER/STENT PLACEMENT;  Surgeon: Ceasar Mons, MD;  Location: [facility];  Service: Urology;  Laterality: Right;   EXPLORATORY LAPAROTOMY WITH ABDOMINAL MASS EXCISION  08/2000   "looking for  cancer"   [place] CATH AND CORS/GRAFTS ANGIOGRAPHY N/A 12/16/2018   Procedure: LEFT HEART CATH AND CORS/GRAFTS ANGIOGRAPHY;  Surgeon: Sherren Mocha, MD;  Location: [place] [facility];  Service: Cardiovascular;  Laterality: N/A;   LEFT HEART CATH AND CORS/GRAFTS ANGIOGRAPHY N/A 12/19/2019   Procedure: LEFT HEART CATH AND CORS/GRAFTS ANGIOGRAPHY;  Surgeon: Martinique, Peter M, MD;  Location: [place] [facility];  Service: Cardiovascular;  Laterality: N/A;   LEFT HEART CATHETERIZATION WITH CORONARY ANGIOGRAM N/A 12/05/2013   Procedure: LEFT HEART CATHETERIZATION WITH CORONARY ANGIOGRAM;  Surgeon: Blane Ohara, MD;  Location: [facility] CATH LAB;  Service: Cardiovascular;  Laterality: N/A;   PERCUTANEOUS CORONARY STENT INTERVENTION (PCI-S)  12/05/2013   Procedure: PERCUTANEOUS CORONARY STENT INTERVENTION (PCI-S);  Surgeon: Blane Ohara, MD;  Location: [facility] CATH LAB;  Service: Cardiovascular;;  mid circumflex and prox OM2   TONSILLECTOMY       Allergies  Allergen Reactions   Imdur [Isosorbide Nitrate] Diarrhea and Other (See Comments)    Headache, diarrhea   Ivp Dye [Iodinated Contrast Media] Other (See Comments)    headache      Family History  Problem Relation Age of Onset   Breast cancer Mother    Colon cancer Father      Social History Ryan Vaughn reports that he has never smoked. His smokeless tobacco use includes snuff. Ryan Vaughn reports that he does not currently use alcohol.   Review of Systems CONSTITUTIONAL: No weight loss, fever, chills, weakness or fatigue.  HEENT: Eyes: No visual loss, blurred vision, double vision or yellow sclerae.No hearing loss, sneezing, congestion, runny nose or sore throat.  SKIN: No rash or itching.  CARDIOVASCULAR: per hpi RESPIRATORY: No shortness of breath, cough or sputum.  GASTROINTESTINAL: No anorexia, nausea, vomiting or diarrhea. No abdominal pain or blood.  GENITOURINARY: No burning on urination, no  polyuria NEUROLOGICAL: No headache, dizziness, syncope, paralysis, ataxia, numbness or tingling in the extremities. No change in bowel or bladder control.  MUSCULOSKELETAL: No muscle, back pain, joint pain or stiffness.  LYMPHATICS: No enlarged nodes. No history of splenectomy.  PSYCHIATRIC: No history of depression or anxiety.  ENDOCRINOLOGIC: No reports of sweating, cold or heat intolerance. No polyuria or polydipsia.  Marland Kitchen   Physical Examination Today's Vitals   10/07/21 1444  BP: 100/72  Pulse: (!) 51  SpO2: 95%  Weight: 228 lb 12.8 oz (103.8 kg)  Height: '5\' 11"'$  (1.803 m)   Body mass index is 31.91 kg/m.  Gen: resting comfortably, no acute distress HEENT: no scleral icterus, pupils equal round and reactive, no palptable cervical adenopathy,  CV: RRR, no m/r/g no jvd Resp: Clear to auscultation bilaterally GI:  abdomen is soft, non-tender, non-distended, normal bowel sounds, no hepatosplenomegaly MSK: extremities are warm, no edema.  Skin: warm, no rash Neuro:  no focal deficits Psych: appropriate affect      Assessment and Plan   1. CAD - medical therapy limited by soft bp's, not on ACE/ARB - no recent symptoms - would continue DAPT given extensive CAD history and disease burden.  - HR 51 by dynamap, EKG shows SR at 77. Continue beta blocker at current dose. Has some sinus arrhythmia, perhaps affected the dynamap count.    2. Hyperlipidemia - LDL above goal for high risk patient, goal would be <55 - add zetia '10mg'$  daily, continue crestor.     Arnoldo Lenis, M.D.,

## 2021-10-07 NOTE — Patient Instructions (Signed)
Medication Instructions:  Begin Zetia 10mg daily  Continue all other medications.     Labwork: none  Testing/Procedures: none  Follow-Up: 6 months   Any Other Special Instructions Will Be Listed Below (If Applicable).   If you need a refill on your cardiac medications before your next appointment, please call your pharmacy.  

## 2021-10-11 ENCOUNTER — Other Ambulatory Visit: Payer: Self-pay | Admitting: Cardiology

## 2021-10-15 ENCOUNTER — Other Ambulatory Visit: Payer: Self-pay | Admitting: Cardiology

## 2022-01-30 ENCOUNTER — Other Ambulatory Visit: Payer: Self-pay

## 2022-01-30 MED ORDER — RANOLAZINE ER 500 MG PO TB12: ORAL_TABLET | ORAL | 1 refills | Status: DC

## 2022-01-30 MED ORDER — CLOPIDOGREL BISULFATE 75 MG PO TABS: 75.0000 mg | ORAL_TABLET | Freq: Every day | ORAL | 3 refills | Status: DC

## 2022-03-09 DIAGNOSIS — R059 Cough, unspecified: Secondary | ICD-10-CM | POA: Diagnosis not present

## 2022-03-09 DIAGNOSIS — R030 Elevated blood-pressure reading, without diagnosis of hypertension: Secondary | ICD-10-CM | POA: Diagnosis not present

## 2022-03-09 DIAGNOSIS — Z20828 Contact with and (suspected) exposure to other viral communicable diseases: Secondary | ICD-10-CM | POA: Diagnosis not present

## 2022-03-14 DIAGNOSIS — R5383 Other fatigue: Secondary | ICD-10-CM | POA: Diagnosis not present

## 2022-03-14 DIAGNOSIS — I10 Essential (primary) hypertension: Secondary | ICD-10-CM | POA: Diagnosis not present

## 2022-03-14 DIAGNOSIS — D126 Benign neoplasm of colon, unspecified: Secondary | ICD-10-CM | POA: Diagnosis not present

## 2022-03-14 DIAGNOSIS — I2510 Atherosclerotic heart disease of native coronary artery without angina pectoris: Secondary | ICD-10-CM | POA: Diagnosis not present

## 2022-03-14 DIAGNOSIS — Z6831 Body mass index (BMI) 31.0-31.9, adult: Secondary | ICD-10-CM | POA: Diagnosis not present

## 2022-03-14 DIAGNOSIS — C8330 Diffuse large B-cell lymphoma, unspecified site: Secondary | ICD-10-CM | POA: Diagnosis not present

## 2022-03-14 DIAGNOSIS — Z8572 Personal history of non-Hodgkin lymphomas: Secondary | ICD-10-CM | POA: Diagnosis not present

## 2022-03-14 DIAGNOSIS — B351 Tinea unguium: Secondary | ICD-10-CM | POA: Diagnosis not present

## 2022-03-14 DIAGNOSIS — Z955 Presence of coronary angioplasty implant and graft: Secondary | ICD-10-CM | POA: Diagnosis not present

## 2022-03-14 DIAGNOSIS — E7849 Other hyperlipidemia: Secondary | ICD-10-CM | POA: Diagnosis not present

## 2022-03-14 DIAGNOSIS — G4762 Sleep related leg cramps: Secondary | ICD-10-CM | POA: Diagnosis not present

## 2022-03-14 DIAGNOSIS — E1169 Type 2 diabetes mellitus with other specified complication: Secondary | ICD-10-CM | POA: Diagnosis not present

## 2022-03-22 DIAGNOSIS — E1169 Type 2 diabetes mellitus with other specified complication: Secondary | ICD-10-CM | POA: Diagnosis not present

## 2022-03-28 DIAGNOSIS — Z23 Encounter for immunization: Secondary | ICD-10-CM | POA: Diagnosis not present

## 2022-04-19 ENCOUNTER — Ambulatory Visit: Payer: Medicare Other | Attending: Cardiology | Admitting: Cardiology

## 2022-04-19 ENCOUNTER — Encounter: Payer: Self-pay | Admitting: Cardiology

## 2022-04-19 VITALS — BP 118/82 | HR 74 | Ht 71.0 in | Wt 226.8 lb

## 2022-04-19 DIAGNOSIS — I2510 Atherosclerotic heart disease of native coronary artery without angina pectoris: Secondary | ICD-10-CM | POA: Insufficient documentation

## 2022-04-19 DIAGNOSIS — E782 Mixed hyperlipidemia: Secondary | ICD-10-CM | POA: Insufficient documentation

## 2022-04-19 NOTE — Patient Instructions (Signed)

## 2022-04-19 NOTE — Progress Notes (Signed)
Clinical Summary Ryan Vaughn is a 71 y.o.male seen today for follow up of the following medical problems.     1. CAD - h/o CAD s/p single-vessel CABG (LIMA-LAD), multiple PCI in the RCA and LCx territories,  - 11/2018 cath revealed patent LIMA to the LAD and patent stents in the RCA and circumflex.   - admission 11/2019 with NSTEMI.  - 11/2019 cath LM normal, prox LAD occluded, LCX occluded, RCA patent. Patent LIMA-LAD graft. NSTEMI due to occlusion of LCX, not salvageable and recs for medical management - echo LVEF 50-55%, grade II DDx     - headaches on imdur - started on ranexa as inpatient - continue DAPT in setting of extensive CAD hsitory  - no chest pain, no SOB/DOE - compliant with meds   2. Hyperilpidemia   02/2020 TC 114 TG 172 HDL 30 LDL 55  08/2021 TC 134 TG 197 HDL 34 LDL 67 - last visit we added zetia to his crestor '40mg'$  daily, requesting most recent labs from pcp   SH: works [employer], [place]. Enjoys hunting and fishing, each Nov takes his camper up to [place] for deer hunting.    Past Medical History:  Diagnosis Date   Arthritis    CAD (coronary artery disease)    a. DES to OM 1 and DES mLCX (12/05/13) b. CABG x1  ('92) c. 6 stents placed in 2009? (pt hx)    Diabetes mellitus without complication ([place])    type 2   Essential hypertension, benign    Heart murmur    as a child    History of kidney stones    Lumbago    Mixed hyperlipidemia    Myocardial infarction [facility]) age 33   Non Hodgkin's lymphoma ([place]) 2002   a. stage IV, chemo done   Personal history of colonic polyps      Allergies  Allergen Reactions   Imdur [Isosorbide Nitrate] Diarrhea and Other (See Comments)    Headache, diarrhea   Ivp Dye [Iodinated Contrast Media] Other (See Comments)    headache     Current Outpatient Medications  Medication Sig Dispense Refill   aspirin EC 81 MG EC tablet Take 1 tablet (81 mg total) by mouth daily. Swallow whole. 30 tablet 11    clopidogrel (PLAVIX) 75 MG tablet Take 1 tablet (75 mg total) by mouth daily. 90 tablet 3   ezetimibe (ZETIA) 10 MG tablet Take 1 tablet (10 mg total) by mouth daily. 30 tablet 6   metFORMIN (GLUCOPHAGE) 500 MG tablet Take 500 mg by mouth 2 (two) times daily with a meal.     metoprolol succinate (TOPROL-XL) 50 MG 24 hr tablet Take 50 mg by mouth daily.     nitroGLYCERIN (NITROSTAT) 0.4 MG SL tablet Place 1 tablet (0.4 mg total) under the tongue every 5 (five) minutes x 3 doses as needed for chest pain (if no relief after 2nd dose, proceed to the ED for an evaluation or call 911). 25 tablet 3   ranolazine (RANEXA) 500 MG 12 hr tablet Take 1 tablet by mouth twice daily. 180 tablet 1   rosuvastatin (CRESTOR) 40 MG tablet TAKE 1 TABLET(40 MG) BY MOUTH DAILY 90 tablet 2   No current facility-administered medications for this visit.     Past Surgical History:  Procedure Laterality Date   CARDIAC CATHETERIZATION N/A 07/16/2015   Procedure: Left Heart Cath and Cors/Grafts Angiography;  Surgeon: Jettie Booze, MD;  Location: [place]  [facility];  Service: Cardiovascular;  Laterality: N/A;   CARDIAC CATHETERIZATION N/A 07/16/2015   Procedure: Coronary Stent Intervention;  Surgeon: Jettie Booze, MD;  Location: [place] [facility];  Service: Cardiovascular;  Laterality: N/A;   CHOLECYSTECTOMY  08/2000   CORONARY ANGIOPLASTY WITH STENT PLACEMENT  ~ 1999; ~ 2009 X 2; 12/05/2013   "1 +3 + 2 + 2" ( total of 8 or 9 stents)   CORONARY ARTERY BYPASS GRAFT  1992   "CABG X1"   CYSTOSCOPY W/ URETERAL STENT PLACEMENT Right 09/12/2019   Procedure: CYSTOSCOPY WITH RETROGRADE PYELOGRAM/URETERAL STENT PLACEMENT;  Surgeon: Ceasar Mons, MD;  Location: [facility];  Service: Urology;  Laterality: Right;   CYSTOSCOPY/URETEROSCOPY/HOLMIUM LASER/STENT PLACEMENT Right 10/01/2019   Procedure: CYSTOSCOPY/URETEROSCOPY/HOLMIUM LASER/STENT PLACEMENT;  Surgeon: Ceasar Mons, MD;  [facility];  Service: Urology;  Laterality: Right;   EXPLORATORY LAPAROTOMY WITH ABDOMINAL MASS EXCISION  08/2000   "looking for cancer"   [place] CATH AND CORS/GRAFTS ANGIOGRAPHY N/A 12/16/2018   Procedure: LEFT HEART CATH AND CORS/GRAFTS ANGIOGRAPHY;  Surgeon: Sherren Mocha, MD;  Location: [place] [facility];  Service: Cardiovascular;  Laterality: N/A;   LEFT HEART CATH AND CORS/GRAFTS ANGIOGRAPHY N/A 12/19/2019   Procedure: LEFT HEART CATH AND CORS/GRAFTS ANGIOGRAPHY;  Surgeon: Martinique, Peter M, MD;  Location: [place] [facility];  Service: Cardiovascular;  Laterality: N/A;   LEFT HEART CATHETERIZATION WITH CORONARY ANGIOGRAM N/A 12/05/2013   Procedure: LEFT HEART CATHETERIZATION WITH CORONARY ANGIOGRAM;  Surgeon: Blane Ohara, MD;  Location: [facility] CATH LAB;  Service: Cardiovascular;  Laterality: N/A;   PERCUTANEOUS CORONARY STENT INTERVENTION (PCI-S)  12/05/2013   Procedure: PERCUTANEOUS CORONARY STENT INTERVENTION (PCI-S);  Surgeon: Blane Ohara, MD;  Location: [facility];  Service: Cardiovascular;;  mid circumflex and prox OM2   TONSILLECTOMY       Allergies  Allergen Reactions   Imdur [Isosorbide Nitrate] Diarrhea and Other (See Comments)    Headache, diarrhea   Ivp Dye [Iodinated Contrast Media] Other (See Comments)    headache      Family History  Problem Relation Age of Onset   Breast cancer Mother    Colon cancer Father      Social History Ryan Vaughn reports that he has never smoked. His smokeless tobacco use includes snuff. Ryan Vaughn reports that he does not currently use alcohol.   Review of Systems CONSTITUTIONAL: No weight loss, fever, chills, weakness or fatigue.  HEENT: Eyes: No visual loss, blurred vision, double vision or yellow sclerae.No hearing loss, sneezing, congestion, runny nose or sore throat.  SKIN: No rash or itching.  CARDIOVASCULAR: per hpi RESPIRATORY: No shortness of breath, cough or sputum.   GASTROINTESTINAL: No anorexia, nausea, vomiting or diarrhea. No abdominal pain or blood.  GENITOURINARY: No burning on urination, no polyuria NEUROLOGICAL: No headache, dizziness, syncope, paralysis, ataxia, numbness or tingling in the extremities. No change in bowel or bladder control.  MUSCULOSKELETAL: No muscle, back pain, joint pain or stiffness.  LYMPHATICS: No enlarged nodes. No history of splenectomy.  PSYCHIATRIC: No history of depression or anxiety.  ENDOCRINOLOGIC: No reports of sweating, cold or heat intolerance. No polyuria or polydipsia.  Marland Kitchen   Physical Examination Today's Vitals   04/19/22 0802  BP: 118/82  Pulse: 74  SpO2: 96%  Weight: 226 lb 12.8 oz (102.9 kg)  Height: '5\' 11"'$  (1.803 m)   Body mass index is 31.63 kg/m.  Gen: resting comfortably,  no acute distress HEENT: no scleral icterus, pupils equal round and reactive, no palptable cervical adenopathy,  CV: RRR, no m/r/g no jvd Resp: Clear to auscultation bilaterally GI: abdomen is soft, non-tender, non-distended, normal bowel sounds, no hepatosplenomegaly MSK: extremities are warm, no edema.  Skin: warm, no rash Neuro:  no focal deficits Psych: appropriate affect   Diagnostic Studies     Assessment and Plan   1. CAD - medical therapy limited by soft bp's, not on ACE/ARB - would continue DAPT given extensive CAD history and disease burden.  -no symptoms, continue current meds     2. Hyperlipidemia - request labs from pcp, continue crestor and zetia  F/u 6 months       Arnoldo Lenis, M.D.

## 2022-05-16 ENCOUNTER — Other Ambulatory Visit: Payer: Self-pay | Admitting: Cardiology

## 2022-06-05 DIAGNOSIS — B338 Other specified viral diseases: Secondary | ICD-10-CM | POA: Diagnosis not present

## 2022-06-05 DIAGNOSIS — R030 Elevated blood-pressure reading, without diagnosis of hypertension: Secondary | ICD-10-CM | POA: Diagnosis not present

## 2022-06-05 DIAGNOSIS — R062 Wheezing: Secondary | ICD-10-CM | POA: Diagnosis not present

## 2022-06-05 DIAGNOSIS — Z20828 Contact with and (suspected) exposure to other viral communicable diseases: Secondary | ICD-10-CM | POA: Diagnosis not present

## 2022-06-12 DIAGNOSIS — R030 Elevated blood-pressure reading, without diagnosis of hypertension: Secondary | ICD-10-CM | POA: Diagnosis not present

## 2022-06-12 DIAGNOSIS — R062 Wheezing: Secondary | ICD-10-CM | POA: Diagnosis not present

## 2022-06-12 DIAGNOSIS — Z20828 Contact with and (suspected) exposure to other viral communicable diseases: Secondary | ICD-10-CM | POA: Diagnosis not present

## 2022-06-12 DIAGNOSIS — B338 Other specified viral diseases: Secondary | ICD-10-CM | POA: Diagnosis not present

## 2022-06-12 DIAGNOSIS — J209 Acute bronchitis, unspecified: Secondary | ICD-10-CM | POA: Diagnosis not present

## 2022-08-14 ENCOUNTER — Other Ambulatory Visit: Payer: Self-pay | Admitting: Cardiology

## 2022-09-13 ENCOUNTER — Telehealth: Payer: Self-pay | Admitting: Cardiology

## 2022-09-13 MED ORDER — NITROGLYCERIN 0.4 MG SL SUBL
0.4000 mg | SUBLINGUAL_TABLET | SUBLINGUAL | 3 refills | Status: DC | PRN
Start: 2022-09-13 — End: 2023-12-18

## 2022-09-13 NOTE — Telephone Encounter (Signed)
*  STAT* If patient is at the pharmacy, call can be transferred to refill team.   1. Which medications need to be refilled? (please list name of each medication and dose if known)   nitroGLYCERIN (NITROSTAT) 0.4 MG SL tablet    2. Which pharmacy/location (including street and city if local pharmacy) is medication to be sent to? [facility] [phone] - [place], [place] - [address] AT [facility] [address]   3. Do they need a 30 day or 90 day supply? 30 day

## 2022-09-14 DIAGNOSIS — E7849 Other hyperlipidemia: Secondary | ICD-10-CM | POA: Diagnosis not present

## 2022-09-14 DIAGNOSIS — E782 Mixed hyperlipidemia: Secondary | ICD-10-CM | POA: Diagnosis not present

## 2022-09-14 DIAGNOSIS — N400 Enlarged prostate without lower urinary tract symptoms: Secondary | ICD-10-CM | POA: Diagnosis not present

## 2022-09-14 DIAGNOSIS — E7801 Familial hypercholesterolemia: Secondary | ICD-10-CM | POA: Diagnosis not present

## 2022-09-14 DIAGNOSIS — E7800 Pure hypercholesterolemia, unspecified: Secondary | ICD-10-CM | POA: Diagnosis not present

## 2022-09-14 DIAGNOSIS — I10 Essential (primary) hypertension: Secondary | ICD-10-CM | POA: Diagnosis not present

## 2022-09-14 DIAGNOSIS — E1169 Type 2 diabetes mellitus with other specified complication: Secondary | ICD-10-CM | POA: Diagnosis not present

## 2022-09-14 DIAGNOSIS — Z0001 Encounter for general adult medical examination with abnormal findings: Secondary | ICD-10-CM | POA: Diagnosis not present

## 2022-09-14 DIAGNOSIS — E039 Hypothyroidism, unspecified: Secondary | ICD-10-CM | POA: Diagnosis not present

## 2022-09-14 DIAGNOSIS — Z1329 Encounter for screening for other suspected endocrine disorder: Secondary | ICD-10-CM | POA: Diagnosis not present

## 2022-09-14 DIAGNOSIS — Z125 Encounter for screening for malignant neoplasm of prostate: Secondary | ICD-10-CM | POA: Diagnosis not present

## 2022-09-19 DIAGNOSIS — I10 Essential (primary) hypertension: Secondary | ICD-10-CM | POA: Diagnosis not present

## 2022-09-19 DIAGNOSIS — E782 Mixed hyperlipidemia: Secondary | ICD-10-CM | POA: Diagnosis not present

## 2022-09-19 DIAGNOSIS — M7541 Impingement syndrome of right shoulder: Secondary | ICD-10-CM | POA: Diagnosis not present

## 2022-09-19 DIAGNOSIS — Z0001 Encounter for general adult medical examination with abnormal findings: Secondary | ICD-10-CM | POA: Diagnosis not present

## 2022-09-19 DIAGNOSIS — M75111 Incomplete rotator cuff tear or rupture of right shoulder, not specified as traumatic: Secondary | ICD-10-CM | POA: Diagnosis not present

## 2022-09-19 DIAGNOSIS — D126 Benign neoplasm of colon, unspecified: Secondary | ICD-10-CM | POA: Diagnosis not present

## 2022-09-19 DIAGNOSIS — E1169 Type 2 diabetes mellitus with other specified complication: Secondary | ICD-10-CM | POA: Diagnosis not present

## 2022-09-19 DIAGNOSIS — Z955 Presence of coronary angioplasty implant and graft: Secondary | ICD-10-CM | POA: Diagnosis not present

## 2022-09-19 DIAGNOSIS — I2510 Atherosclerotic heart disease of native coronary artery without angina pectoris: Secondary | ICD-10-CM | POA: Diagnosis not present

## 2022-09-19 DIAGNOSIS — C8330 Diffuse large B-cell lymphoma, unspecified site: Secondary | ICD-10-CM | POA: Diagnosis not present

## 2022-09-19 DIAGNOSIS — Z6832 Body mass index (BMI) 32.0-32.9, adult: Secondary | ICD-10-CM | POA: Diagnosis not present

## 2022-09-19 DIAGNOSIS — Z8572 Personal history of non-Hodgkin lymphomas: Secondary | ICD-10-CM | POA: Diagnosis not present

## 2022-10-12 ENCOUNTER — Ambulatory Visit: Payer: Medicare Other | Attending: Cardiology | Admitting: Cardiology

## 2022-10-12 ENCOUNTER — Encounter: Payer: Self-pay | Admitting: *Deleted

## 2022-10-12 ENCOUNTER — Encounter: Payer: Self-pay | Admitting: Cardiology

## 2022-10-12 VITALS — BP 130/80 | HR 68 | Ht 71.0 in | Wt 225.4 lb

## 2022-10-12 DIAGNOSIS — E782 Mixed hyperlipidemia: Secondary | ICD-10-CM | POA: Diagnosis not present

## 2022-10-12 DIAGNOSIS — I2510 Atherosclerotic heart disease of native coronary artery without angina pectoris: Secondary | ICD-10-CM

## 2022-10-12 DIAGNOSIS — Z951 Presence of aortocoronary bypass graft: Secondary | ICD-10-CM

## 2022-10-12 NOTE — Patient Instructions (Addendum)
Medication Instructions:  Continue all current medications.   Labwork: none  Testing/Procedures: none  Follow-Up: 6 months   Any Other Special Instructions Will Be Listed Below (If Applicable).   If you need a refill on your cardiac medications before your next appointment, please call your pharmacy.  

## 2022-10-12 NOTE — Progress Notes (Signed)
Clinical Summary Ryan Vaughn is a 72 y.o.male seen today for follow up of the following medical problems.      1. CAD - h/o CAD s/p single-vessel CABG (LIMA-LAD), multiple PCI in the RCA and LCx territories,  - 11/2018 cath revealed patent LIMA to the LAD and patent stents in the RCA and circumflex.   - admission 11/2019 with NSTEMI.  - 11/2019 cath LM normal, prox LAD occluded, LCX occluded, RCA patent. Patent LIMA-LAD graft. NSTEMI due to occlusion of LCX, not salvageable and recs for medical management - echo LVEF 50-55%, grade II DDx     - headaches on imdur, discontinue - started on ranexa as inpatient - continue DAPT in setting of extensive CAD hsitory   - no chest pains, no SOB/DOE - compliant with meds   2. Hyperilpidemia   02/2020 TC 114 TG 172 HDL 30 LDL 55  08/2021 TC 134 TG 197 HDL 34 LDL 67 - reports labs with pcp last month - he is on crestor 40mg , zetia 10mg    SH: works [employer], [address]. Enjoys hunting and fishing, each Nov takes his camper up to [place] for deer hunting.  Past Medical History:  Diagnosis Date   Arthritis    CAD (coronary artery disease)    a. DES to OM 1 and DES mLCX (12/05/13) b. CABG x1  ('92) c. 6 stents placed in 2009? (pt hx)    Diabetes mellitus without complication (H[facility])    type 2   Essential hypertension, benign    Heart murmur    as a child    History of kidney stones    Lumbago    Mixed hyperlipidemia    Myocardial infarction [facility]) age 21   Non Hodgkin's lymphoma ([facility]) 2002   a. stage IV, chemo done   Personal history of colonic polyps      Allergies  Allergen Reactions   Imdur [Isosorbide Nitrate] Diarrhea and Other (See Comments)    Headache, diarrhea   Ivp Dye [Iodinated Contrast Media] Other (See Comments)    headache     Current Outpatient Medications  Medication Sig Dispense Refill   aspirin EC 81 MG EC tablet Take 1 tablet (81 mg total) by mouth daily. Swallow whole. 30 tablet 11   clopidogrel  (PLAVIX) 75 MG tablet Take 1 tablet (75 mg total) by mouth daily. 90 tablet 3   ezetimibe (ZETIA) 10 MG tablet TAKE 1 TABLET(10 MG) BY MOUTH DAILY 30 tablet 6   metFORMIN (GLUCOPHAGE) 500 MG tablet Take 500 mg by mouth 2 (two) times daily with a meal.     metoprolol succinate (TOPROL-XL) 50 MG 24 hr tablet Take 50 mg by mouth daily.     nitroGLYCERIN (NITROSTAT) 0.4 MG SL tablet Place 1 tablet (0.4 mg total) under the tongue every 5 (five) minutes x 3 doses as needed for chest pain (if no relief after 2nd dose, proceed to the ED for an evaluation or call 911). 25 tablet 3   ranolazine (RANEXA) 500 MG 12 hr tablet Take 1 tablet by mouth twice daily. 180 tablet 1   rosuvastatin (CRESTOR) 40 MG tablet TAKE 1 TABLET(40 MG) BY MOUTH DAILY 90 tablet 1   No current facility-administered medications for this visit.     Past Surgical History:  Procedure Laterality Date   CARDIAC CATHETERIZATION N/A 07/16/2015   Procedure: Left Heart Cath and Cors/Grafts Angiography;  Surgeon: Corky Crafts, MD;  [facility]: [facility] INVASIVE CV LAB;  Service: Cardiovascular;  Laterality: N/A;   CARDIAC CATHETERIZATION N/A 07/16/2015   Procedure: Coronary Stent Intervention;  Surgeon: Corky Crafts, MD;  Location: [facility] INVASIVE CV LAB;  Service: Cardiovascular;  Laterality: N/A;   CHOLECYSTECTOMY  08/2000   CORONARY ANGIOPLASTY WITH STENT PLACEMENT  ~ 1999; ~ 2009 X 2; 12/05/2013   "1 +3 + 2 + 2" ( total of 8 or 9 stents)   CORONARY ARTERY BYPASS GRAFT  1992   "CABG X1"   CYSTOSCOPY W/ URETERAL STENT PLACEMENT Right 09/12/2019   Procedure: CYSTOSCOPY WITH RETROGRADE PYELOGRAM/URETERAL STENT PLACEMENT;  Surgeon: Rene Paci, MD;  [facility];  Service: Urology;  Laterality: Right;   CYSTOSCOPY/URETEROSCOPY/HOLMIUM LASER/STENT PLACEMENT Right 10/01/2019   Procedure: CYSTOSCOPY/URETEROSCOPY/HOLMIUM LASER/STENT PLACEMENT;  Surgeon: Rene Paci, MD;  Location: [facility];   Service: Urology;  Laterality: Right;   EXPLORATORY LAPAROTOMY WITH ABDOMINAL MASS EXCISION  08/2000   "looking for cancer"   INGUINAL HERNIA REPAIR     LEFT HEART CATH AND CORS/GRAFTS ANGIOGRAPHY N/A 12/16/2018   Procedure: LEFT HEART CATH AND CORS/GRAFTS ANGIOGRAPHY;  Surgeon: Tonny Bollman, MD;  Location: [facility] INVASIVE CV LAB;  Service: Cardiovascular;  Laterality: N/A;   LEFT HEART CATH AND CORS/GRAFTS ANGIOGRAPHY N/A 12/19/2019   Procedure: LEFT HEART CATH AND CORS/GRAFTS ANGIOGRAPHY;  Surgeon: Swazil[place], Peter M, MD;  Location: [facility] INVASIVE CV LAB;  Service: Cardiovascular;  Laterality: N/A;   LEFT HEART CATHETERIZATION WITH CORONARY ANGIOGRAM N/A 12/05/2013   Procedure: LEFT HEART CATHETERIZATION WITH CORONARY ANGIOGRAM;  Surgeon: Micheline Chapman, MD;  Location: [facility] CATH LAB;  Service: Cardiovascular;  Laterality: N/A;   PERCUTANEOUS CORONARY STENT INTERVENTION (PCI-S)  12/05/2013   Procedure: PERCUTANEOUS CORONARY STENT INTERVENTION (PCI-S);  Surgeon: Micheline Chapman, MD;  Location: [facility] CATH LAB;  Service: Cardiovascular;;  mid circumflex and prox OM2   TONSILLECTOMY       Allergies  Allergen Reactions   Imdur [Isosorbide Nitrate] Diarrhea and Other (See Comments)    Headache, diarrhea   Ivp Dye [Iodinated Contrast Media] Other (See Comments)    headache      Family History  Problem Relation Age of Onset   Breast cancer Mother    Colon cancer Father      Social History Ryan Vaughn reports that he has never smoked. His smokeless tobacco use includes snuff. Ryan Vaughn reports that he does not currently use alcohol.   Review of Systems CONSTITUTIONAL: No weight loss, fever, chills, weakness or fatigue.  HEENT: Eyes: No visual loss, blurred vision, double vision or yellow sclerae.No hearing loss, sneezing, congestion, runny nose or sore throat.  SKIN: No rash or itching.  CARDIOVASCULAR: per hpi RESPIRATORY: No shortness of breath, cough or sputum.  GASTROINTESTINAL: No  anorexia, nausea, vomiting or diarrhea. No abdominal pain or blood.  GENITOURINARY: No burning on urination, no polyuria NEUROLOGICAL: No headache, dizziness, syncope, paralysis, ataxia, numbness or tingling in the extremities. No change in bowel or bladder control.  MUSCULOSKELETAL: No muscle, back pain, joint pain or stiffness.  LYMPHATICS: No enlarged nodes. No history of splenectomy.  PSYCHIATRIC: No history of depression or anxiety.  ENDOCRINOLOGIC: No reports of sweating, cold or heat intolerance. No polyuria or polydipsia.  Marland Kitchen   Physical Examination Today's Vitals   10/12/22 0842  BP: 130/80  Pulse: 68  SpO2: 97%  Weight: 225 lb 6.4 oz (102.2 kg)  Height: 5\' 11"  (1.803 m)   Body mass index is 31.44 kg/m.  Gen: resting comfortably, no acute distress  HEENT: no scleral icterus, pupils equal round and reactive, no palptable cervical adenopathy,  CV: RRR, no m/rg, no jvd Resp: Clear to auscultation bilaterally GI: abdomen is soft, non-tender, non-distended, normal bowel sounds, no hepatosplenomegaly MSK: extremities are warm, no edema.  Skin: warm, no rash Neuro:  no focal deficits Psych: appropriate affect     Assessment and Plan   1. CAD - continued DAPT given extensive CAD history and disease burden.  -no recent symptoms, continue current meds - EKG today shows NSR, no ischemic changes     2. Hyperlipidemia - goal LDL would be <55 based on prior MI, multiple revascuarliazations, multiple risk factors, age.  - request labs from pcp, continue crestor and zetia.    F/u 6 months  Antoine Poche, M.D.

## 2022-10-30 ENCOUNTER — Other Ambulatory Visit: Payer: Self-pay | Admitting: Cardiology

## 2022-12-07 ENCOUNTER — Other Ambulatory Visit: Payer: Self-pay | Admitting: Cardiology

## 2023-01-19 DIAGNOSIS — R059 Cough, unspecified: Secondary | ICD-10-CM | POA: Diagnosis not present

## 2023-01-19 DIAGNOSIS — Z20828 Contact with and (suspected) exposure to other viral communicable diseases: Secondary | ICD-10-CM | POA: Diagnosis not present

## 2023-01-19 DIAGNOSIS — R030 Elevated blood-pressure reading, without diagnosis of hypertension: Secondary | ICD-10-CM | POA: Diagnosis not present

## 2023-01-19 DIAGNOSIS — Z6831 Body mass index (BMI) 31.0-31.9, adult: Secondary | ICD-10-CM | POA: Diagnosis not present

## 2023-01-19 DIAGNOSIS — J029 Acute pharyngitis, unspecified: Secondary | ICD-10-CM | POA: Diagnosis not present

## 2023-03-21 ENCOUNTER — Telehealth: Payer: Self-pay | Admitting: Cardiology

## 2023-03-21 DIAGNOSIS — I10 Essential (primary) hypertension: Secondary | ICD-10-CM | POA: Diagnosis not present

## 2023-03-21 DIAGNOSIS — E1169 Type 2 diabetes mellitus with other specified complication: Secondary | ICD-10-CM | POA: Diagnosis not present

## 2023-03-21 DIAGNOSIS — E7849 Other hyperlipidemia: Secondary | ICD-10-CM | POA: Diagnosis not present

## 2023-03-21 DIAGNOSIS — E7801 Familial hypercholesterolemia: Secondary | ICD-10-CM | POA: Diagnosis not present

## 2023-03-21 MED ORDER — ROSUVASTATIN CALCIUM 40 MG PO TABS: ORAL_TABLET | ORAL | 1 refills | Status: DC

## 2023-03-21 NOTE — Telephone Encounter (Signed)
*  STAT* If patient is at the pharmacy, call can be transferred to refill team.   1. Which medications need to be refilled? (please list name of each medication and dose if known)   rosuvastatin (CRESTOR) 40 MG tablet    4. Which pharmacy/location (including street and city if local pharmacy) is medication to be sent to?   [facility] [facility]  5. Do they need a 30 day or 90 day supply?   90 day

## 2023-03-21 NOTE — Telephone Encounter (Signed)
filled

## 2023-03-28 ENCOUNTER — Encounter: Payer: Self-pay | Admitting: Family Medicine

## 2023-03-28 DIAGNOSIS — C8330 Diffuse large B-cell lymphoma, unspecified site: Secondary | ICD-10-CM | POA: Diagnosis not present

## 2023-03-28 DIAGNOSIS — I10 Essential (primary) hypertension: Secondary | ICD-10-CM | POA: Diagnosis not present

## 2023-03-28 DIAGNOSIS — E1169 Type 2 diabetes mellitus with other specified complication: Secondary | ICD-10-CM | POA: Diagnosis not present

## 2023-03-28 DIAGNOSIS — L249 Irritant contact dermatitis, unspecified cause: Secondary | ICD-10-CM | POA: Diagnosis not present

## 2023-03-28 DIAGNOSIS — E7849 Other hyperlipidemia: Secondary | ICD-10-CM | POA: Diagnosis not present

## 2023-03-28 DIAGNOSIS — I2510 Atherosclerotic heart disease of native coronary artery without angina pectoris: Secondary | ICD-10-CM | POA: Diagnosis not present

## 2023-03-28 DIAGNOSIS — Z955 Presence of coronary angioplasty implant and graft: Secondary | ICD-10-CM | POA: Diagnosis not present

## 2023-03-28 DIAGNOSIS — Z8572 Personal history of non-Hodgkin lymphomas: Secondary | ICD-10-CM | POA: Diagnosis not present

## 2023-03-28 DIAGNOSIS — Z23 Encounter for immunization: Secondary | ICD-10-CM | POA: Diagnosis not present

## 2023-03-28 DIAGNOSIS — Z6831 Body mass index (BMI) 31.0-31.9, adult: Secondary | ICD-10-CM | POA: Diagnosis not present

## 2023-03-28 MED ORDER — ROSUVASTATIN CALCIUM 40 MG PO TABS: ORAL_TABLET | ORAL | 1 refills | Status: DC

## 2023-03-28 NOTE — Telephone Encounter (Signed)
Pt came in office this morning and stated that [facility] [address] is saying they have not received the refill.

## 2023-03-28 NOTE — Telephone Encounter (Signed)
Medication resent in 

## 2023-03-28 NOTE — Addendum Note (Signed)
Addended by: Sharen Hones on: 03/28/2023 10:09 AM   Modules accepted: Orders

## 2023-04-03 ENCOUNTER — Other Ambulatory Visit: Payer: Self-pay | Admitting: Cardiology

## 2023-04-13 ENCOUNTER — Ambulatory Visit: Payer: Medicare Other | Attending: Cardiology | Admitting: Cardiology

## 2023-04-13 ENCOUNTER — Encounter: Payer: Self-pay | Admitting: Cardiology

## 2023-04-13 ENCOUNTER — Encounter: Payer: Self-pay | Admitting: *Deleted

## 2023-04-13 VITALS — BP 122/82 | HR 65 | Ht 71.0 in | Wt 225.4 lb

## 2023-04-13 DIAGNOSIS — I2510 Atherosclerotic heart disease of native coronary artery without angina pectoris: Secondary | ICD-10-CM

## 2023-04-13 DIAGNOSIS — E782 Mixed hyperlipidemia: Secondary | ICD-10-CM | POA: Diagnosis not present

## 2023-04-13 NOTE — Patient Instructions (Signed)
Medication Instructions:  Continue all current medications.   Labwork: none  Testing/Procedures: none  Follow-Up: 6 months   Any Other Special Instructions Will Be Listed Below (If Applicable).   If you need a refill on your cardiac medications before your next appointment, please call your pharmacy.  

## 2023-04-13 NOTE — Progress Notes (Signed)
Clinical Summary Ryan Vaughn is a 72 y.o.male seen today for follow up of the following medical problems.      1. CAD - h/o CAD s/p single-vessel CABG (LIMA-LAD), multiple PCI in the RCA and LCx territories,  - 11/2018 cath revealed patent LIMA to the LAD and patent stents in the RCA and circumflex.   - admission 11/2019 with NSTEMI.  - 11/2019 cath LM normal, prox LAD occluded, LCX occluded, RCA patent. Patent LIMA-LAD graft. NSTEMI due to occlusion of LCX, not salvageable and recs for medical management - echo LVEF 50-55%, grade II DDx     - headaches on imdur, discontinue - started on ranexa as inpatient - continue DAPT in setting of extensive CAD hsitory  - no chest pains, chronic stable SOB/DOE - compliant with meds     2. Hyperilpidemia   02/2020 TC 114 TG 172 HDL 30 LDL 55  08/2021 TC 134 TG 197 HDL 34 LDL 67 - he is on crestor 40mg , zetia 10mg    02/2023 TC 145 HDL 36 TG 276 LDL 54 - reprts recent regular high soda intake, snack foods. Working to wean   SH: works [employer], [address]. Enjoys hunting and fishing, each Nov takes his camper up to [place] for [employer].  Leaving tomorrow for this year's trip. Has been going since he was a kid.  Past Medical History:  Diagnosis Date   Arthritis    CAD (coronary artery disease)    a. DES to OM 1 and DES mLCX (12/05/13) b. CABG x1  ('92) c. 6 stents placed in 2009? (pt hx)    Diabetes mellitus without complication (HCC)    type 2   Essential hypertension, benign    Heart murmur    as a child    History of kidney stones    Lumbago    Mixed hyperlipidemia    Myocardial infarction [facility]) age 59   Non Hodgkin's lymphoma (HCC) 2002   a. stage IV, chemo done   Personal history of colonic polyps      Allergies  Allergen Reactions   Imdur [Isosorbide Nitrate] Diarrhea and Other (See Comments)    Headache, diarrhea   Ivp Dye [Iodinated Contrast Media] Other (See Comments)    headache     Current  Outpatient Medications  Medication Sig Dispense Refill   aspirin EC 81 MG EC tablet Take 1 tablet (81 mg total) by mouth daily. Swallow whole. 30 tablet 11   clopidogrel (PLAVIX) 75 MG tablet TAKE 1 TABLET(75 MG) BY MOUTH DAILY 90 tablet 3   ezetimibe (ZETIA) 10 MG tablet TAKE 1 TABLET(10 MG) BY MOUTH DAILY 30 tablet 6   metFORMIN (GLUCOPHAGE) 500 MG tablet Take 500 mg by mouth 2 (two) times daily with a meal.     metoprolol succinate (TOPROL-XL) 50 MG 24 hr tablet Take 50 mg by mouth daily.     Multiple Vitamin (MULTIVITAMIN ADULT PO) Take by mouth.     nitroGLYCERIN (NITROSTAT) 0.4 MG SL tablet Place 1 tablet (0.4 mg total) under the tongue every 5 (five) minutes x 3 doses as needed for chest pain (if no relief after 2nd dose, proceed to the ED for an evaluation or call 911). 25 tablet 3   ranolazine (RANEXA) 500 MG 12 hr tablet TAKE 1 TABLET BY MOUTH TWICE DAILY 180 tablet 1   rosuvastatin (CRESTOR) 40 MG tablet TAKE 1 TABLET(40 MG) BY MOUTH DAILY 90 tablet 1   No current  facility-administered medications for this visit.     Past Surgical History:  Procedure Laterality Date   CARDIAC CATHETERIZATION N/A 07/16/2015   Procedure: Left Heart Cath and Cors/Grafts Angiography;  Surgeon: Corky Crafts, MD;  Location: [facility] INVASIVE CV LAB;  Service: Cardiovascular;  Laterality: N/A;   CARDIAC CATHETERIZATION N/A 07/16/2015   Procedure: Coronary Stent Intervention;  Surgeon: Corky Crafts, MD;  Location: [facility] INVASIVE CV LAB;  Service: Cardiovascular;  Laterality: N/A;   CHOLECYSTECTOMY  08/2000   CORONARY ANGIOPLASTY WITH STENT PLACEMENT  ~ 1999; ~ 2009 X 2; 12/05/2013   "1 +3 + 2 + 2" ( total of 8 or 9 stents)   CORONARY ARTERY BYPASS GRAFT  1992   "CABG X1"   CYSTOSCOPY W/ URETERAL STENT PLACEMENT Right 09/12/2019   Procedure: CYSTOSCOPY WITH RETROGRADE PYELOGRAM/URETERAL STENT PLACEMENT;  Surgeon: Rene Paci, MD;  Location: [facility];  Service: Urology;  Laterality: Right;    CYSTOSCOPY/URETEROSCOPY/HOLMIUM LASER/STENT PLACEMENT Right 10/01/2019   Procedure: CYSTOSCOPY/URETEROSCOPY/HOLMIUM LASER/STENT PLACEMENT;  Surgeon: Rene Paci, MD;  Location: [facility];  Service: Urology;  Laterality: Right;   EXPLORATORY LAPAROTOMY WITH ABDOMINAL MASS EXCISION  08/2000   "looking for cancer"   INGUINAL HERNIA REPAIR     LEFT HEART CATH AND CORS/GRAFTS ANGIOGRAPHY N/A 12/16/2018   Procedure: LEFT HEART CATH AND CORS/GRAFTS ANGIOGRAPHY;  Surgeon: Tonny Bollman, MD;  Location: [facility] INVASIVE CV LAB;  Service: Cardiovascular;  Laterality: N/A;   LEFT HEART CATH AND CORS/GRAFTS ANGIOGRAPHY N/A 12/19/2019   Procedure: LEFT HEART CATH AND CORS/GRAFTS ANGIOGRAPHY;  Surgeon: Swaziland, Peter M, MD;  Location: [facility] INVASIVE CV LAB;  Service: Cardiovascular;  Laterality: N/A;   LEFT HEART CATHETERIZATION WITH CORONARY ANGIOGRAM N/A 12/05/2013   Procedure: LEFT HEART CATHETERIZATION WITH CORONARY ANGIOGRAM;  Surgeon: Micheline Chapman, MD;  Location: [facility] CATH LAB;  Service: Cardiovascular;  Laterality: N/A;   PERCUTANEOUS CORONARY STENT INTERVENTION (PCI-S)  12/05/2013   Procedure: PERCUTANEOUS CORONARY STENT INTERVENTION (PCI-S);  Surgeon: Micheline Chapman, MD;  Location: [facility] CATH LAB;  Service: Cardiovascular;;  mid circumflex and prox OM2   TONSILLECTOMY       Allergies  Allergen Reactions   Imdur [Isosorbide Nitrate] Diarrhea and Other (See Comments)    Headache, diarrhea   Ivp Dye [Iodinated Contrast Media] Other (See Comments)    headache      Family History  Problem Relation Age of Onset   Breast cancer Mother    Colon cancer Father      Social History Ryan Vaughn reports that he has never smoked. His smokeless tobacco use includes snuff. Ryan Vaughn reports that he does not currently use alcohol.   Review of Systems CONSTITUTIONAL: No weight loss, fever, chills, weakness or fatigue.  HEENT: Eyes: No visual loss, blurred vision, double vision or  yellow sclerae.No hearing loss, sneezing, congestion, runny nose or sore throat.  SKIN: No rash or itching.  CARDIOVASCULAR: per hpi RESPIRATORY: No shortness of breath, cough or sputum.  GASTROINTESTINAL: No anorexia, nausea, vomiting or diarrhea. No abdominal pain or blood.  GENITOURINARY: No burning on urination, no polyuria NEUROLOGICAL: No headache, dizziness, syncope, paralysis, ataxia, numbness or tingling in the extremities. No change in bowel or bladder control.  MUSCULOSKELETAL: No muscle, back pain, joint pain or stiffness.  LYMPHATICS: No enlarged nodes. No history of splenectomy.  PSYCHIATRIC: No history of depression or anxiety.  ENDOCRINOLOGIC: No reports of sweating, cold or heat intolerance. No polyuria or polydipsia.  Marland Kitchen   Physical Examination  Today's Vitals   04/13/23 0856  BP: 122/82  Pulse: 65  SpO2: 98%  Weight: 225 lb 6.4 oz (102.2 kg)  Height: 5\' 11"  (1.803 m)   Body mass index is 31.44 kg/m.  Gen: resting comfortably, no acute distress HEENT: no scleral icterus, pupils equal round and reactive, no palptable cervical adenopathy,  CV: RRR, no m/r,g no jvd Resp: Clear to auscultation bilaterally GI: abdomen is soft, non-tender, non-distended, normal bowel sounds, no hepatosplenomegaly MSK: extremities are warm, no edema.  Skin: warm, no rash Neuro:  no focal deficits Psych: appropriate affect    Assessment and Plan  1. CAD - continued DAPT given extensive CAD history and disease burden.  -no symptoms, continue current meds     2. Hyperlipidemia - goal LDL would be <55 based on prior MI, multiple revascuarliazations, multiple risk factors, age.  - LDL is at goal. Recent increase in TGs due to dietary indiscretions he has already addressed - continue current meds   F/u 6 months     Antoine Poche, M.D.

## 2023-05-06 ENCOUNTER — Other Ambulatory Visit: Payer: Self-pay | Admitting: Cardiology

## 2023-05-08 DIAGNOSIS — J069 Acute upper respiratory infection, unspecified: Secondary | ICD-10-CM | POA: Diagnosis not present

## 2023-05-22 ENCOUNTER — Other Ambulatory Visit: Payer: Self-pay | Admitting: Cardiology

## 2023-09-05 DIAGNOSIS — R0602 Shortness of breath: Secondary | ICD-10-CM | POA: Diagnosis not present

## 2023-10-04 DIAGNOSIS — I10 Essential (primary) hypertension: Secondary | ICD-10-CM | POA: Diagnosis not present

## 2023-10-04 DIAGNOSIS — Z13220 Encounter for screening for lipoid disorders: Secondary | ICD-10-CM | POA: Diagnosis not present

## 2023-10-04 DIAGNOSIS — R5383 Other fatigue: Secondary | ICD-10-CM | POA: Diagnosis not present

## 2023-10-04 DIAGNOSIS — R42 Dizziness and giddiness: Secondary | ICD-10-CM | POA: Diagnosis not present

## 2023-10-04 DIAGNOSIS — E119 Type 2 diabetes mellitus without complications: Secondary | ICD-10-CM | POA: Diagnosis not present

## 2023-10-04 DIAGNOSIS — Z1329 Encounter for screening for other suspected endocrine disorder: Secondary | ICD-10-CM | POA: Diagnosis not present

## 2023-10-08 ENCOUNTER — Other Ambulatory Visit: Payer: Self-pay | Admitting: Cardiology

## 2023-10-11 DIAGNOSIS — Z6831 Body mass index (BMI) 31.0-31.9, adult: Secondary | ICD-10-CM | POA: Diagnosis not present

## 2023-10-11 DIAGNOSIS — I10 Essential (primary) hypertension: Secondary | ICD-10-CM | POA: Diagnosis not present

## 2023-10-11 DIAGNOSIS — Z0000 Encounter for general adult medical examination without abnormal findings: Secondary | ICD-10-CM | POA: Diagnosis not present

## 2023-10-11 DIAGNOSIS — I2510 Atherosclerotic heart disease of native coronary artery without angina pectoris: Secondary | ICD-10-CM | POA: Diagnosis not present

## 2023-10-11 DIAGNOSIS — M6208 Separation of muscle (nontraumatic), other site: Secondary | ICD-10-CM | POA: Diagnosis not present

## 2023-10-11 DIAGNOSIS — E119 Type 2 diabetes mellitus without complications: Secondary | ICD-10-CM | POA: Diagnosis not present

## 2023-10-12 DIAGNOSIS — Z1389 Encounter for screening for other disorder: Secondary | ICD-10-CM | POA: Diagnosis not present

## 2023-10-12 DIAGNOSIS — Z1331 Encounter for screening for depression: Secondary | ICD-10-CM | POA: Diagnosis not present

## 2023-10-12 DIAGNOSIS — Z6831 Body mass index (BMI) 31.0-31.9, adult: Secondary | ICD-10-CM | POA: Diagnosis not present

## 2023-10-12 DIAGNOSIS — Z0000 Encounter for general adult medical examination without abnormal findings: Secondary | ICD-10-CM | POA: Diagnosis not present

## 2023-10-12 DIAGNOSIS — Z0001 Encounter for general adult medical examination with abnormal findings: Secondary | ICD-10-CM | POA: Diagnosis not present

## 2023-10-17 ENCOUNTER — Ambulatory Visit: Payer: Medicare Other | Admitting: Cardiology

## 2023-11-05 ENCOUNTER — Other Ambulatory Visit: Payer: Self-pay | Admitting: Cardiology

## 2023-12-02 ENCOUNTER — Other Ambulatory Visit: Payer: Self-pay | Admitting: Cardiology

## 2023-12-06 ENCOUNTER — Other Ambulatory Visit: Payer: Self-pay | Admitting: Cardiology

## 2023-12-07 ENCOUNTER — Other Ambulatory Visit: Payer: Self-pay

## 2023-12-07 MED ORDER — RANOLAZINE ER 500 MG PO TB12
500.0000 mg | ORAL_TABLET | Freq: Two times a day (BID) | ORAL | 4 refills | Status: AC
Start: 2023-12-07 — End: ?

## 2023-12-07 MED ORDER — EZETIMIBE 10 MG PO TABS: 10.0000 mg | ORAL_TABLET | Freq: Every day | ORAL | 4 refills | Status: DC

## 2023-12-18 ENCOUNTER — Ambulatory Visit: Attending: Cardiology | Admitting: Cardiology

## 2023-12-18 ENCOUNTER — Encounter: Payer: Self-pay | Admitting: Cardiology

## 2023-12-18 VITALS — BP 114/72 | HR 73 | Ht 71.0 in | Wt 218.0 lb

## 2023-12-18 DIAGNOSIS — E782 Mixed hyperlipidemia: Secondary | ICD-10-CM | POA: Diagnosis not present

## 2023-12-18 DIAGNOSIS — I2510 Atherosclerotic heart disease of native coronary artery without angina pectoris: Secondary | ICD-10-CM | POA: Diagnosis not present

## 2023-12-18 MED ORDER — NITROGLYCERIN 0.4 MG SL SUBL: 0.4000 mg | SUBLINGUAL_TABLET | SUBLINGUAL | 3 refills | Status: AC | PRN

## 2023-12-18 NOTE — Patient Instructions (Signed)
Medication Instructions:  Nitroglycerin refilled today Continue all other medications.     Labwork: none  Testing/Procedures: none  Follow-Up: 6 months   Any Other Special Instructions Will Be Listed Below (If Applicable).   If you need a refill on your cardiac medications before your next appointment, please call your pharmacy.  

## 2023-12-18 NOTE — Progress Notes (Signed)
 Clinical Summary Mr. Cumbie is a 73 y.o.male seen today for follow up of the following medical problems.      1. CAD - h/o CAD s/p single-vessel CABG (LIMA-LAD), multiple PCI in the RCA and LCx territories,  - 11/2018 cath revealed patent LIMA to the LAD and patent stents in the RCA and circumflex.   - admission 11/2019 with NSTEMI.  - 11/2019 cath LM normal, prox LAD occluded, LCX occluded, RCA patent. Patent LIMA-LAD graft. NSTEMI due to occlusion of LCX, not salvageable and recs for medical management - echo LVEF 50-55%, grade II DDx     - headaches on imdur , discontinued - started on ranexa  as inpatient - continue DAPT in setting of extensive CAD hsitory     - no chest pains. Some SOB at times slightly worst from baseline. 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  - compliant with meds     2. Hyperilpidemia   02/2020 TC 114 TG 172 HDL 30 LDL 55  08/2021 TC 134 TG 197 HDL 34 LDL 67 - he is on crestor  40mg , zetia  10mg   02/2023 TC 145 HDL 36 TG 276 LDL 54 - reports recent labs 09/2023 with pcp       SH: works [employer], [address]. Enjoys hunting and fishing, each Nov takes his camper up to [place] for [employer].  Leaving tomorrow for this year's trip. Has been going since he was a kid.  Past Medical History:  Diagnosis Date   Arthritis    CAD (coronary artery disease)    a. DES to OM 1 and DES mLCX (12/05/13) b. CABG x1  ('92) c. 6 stents placed in 2009? (pt hx)    Diabetes mellitus without complication (HCC)    type 2   Essential hypertension, benign    Heart murmur    as a child    History of kidney stones    Lumbago    Mixed hyperlipidemia    Myocardial infarction [facility]) age 77   Non Hodgkin's lymphoma (HCC) 2002   a. stage IV, chemo done   Personal history of colonic polyps      Allergies  Allergen Reactions   Imdur  [Isosorbide   Nitrate] Diarrhea and Other (See Comments)    Headache, diarrhea   Ivp Dye [Iodinated Contrast Media] Other (See Comments)    headache     Current Outpatient Medications  Medication Sig Dispense Refill   aspirin  EC 81 MG EC tablet Take 1 tablet (81 mg total) by mouth daily. Swallow whole. 30 tablet 11   clopidogrel  (PLAVIX ) 75 MG tablet TAKE 1 TABLET(75 MG) BY MOUTH DAILY 90 tablet 3   ezetimibe  (ZETIA ) 10 MG tablet Take 1 tablet (10 mg total) by mouth daily. 30 tablet 4   metFORMIN (GLUCOPHAGE) 500 MG tablet Take 500 mg by mouth 2 (two) times daily with a meal.     metoprolol  succinate (TOPROL -XL) 50 MG 24 hr tablet Take 50 mg by mouth daily.     Multiple Vitamin (MULTIVITAMIN ADULT PO) Take by mouth.     nitroGLYCERIN  (NITROSTAT ) 0.4 MG SL tablet Place 1 tablet (0.4 mg total) under the tongue every 5 (five) minutes x 3 doses as needed for chest pain (if no relief after 2nd dose, proceed to the ED for an evaluation or call 911). 25 tablet 3  ranolazine  (RANEXA ) 500 MG 12 hr tablet Take 1 tablet (500 mg total) by mouth 2 (two) times daily. 60 tablet 4   rosuvastatin  (CRESTOR ) 40 MG tablet TAKE 1 TABLET(40 MG) BY MOUTH DAILY 90 tablet 1   triamcinolone  cream (KENALOG ) 0.1 % Apply 1 Application topically 2 (two) times daily.     No current facility-administered medications for this visit.     Past Surgical History:  Procedure Laterality Date   CARDIAC CATHETERIZATION N/A 07/16/2015   Procedure: Left Heart Cath and Cors/Grafts Angiography;  Surgeon: Candyce GORMAN Reek, MD;  Location: [facility] INVASIVE CV LAB;  Service: Cardiovascular;  Laterality: N/A;   CARDIAC CATHETERIZATION N/A 07/16/2015   Procedure: Coronary Stent Intervention;  Surgeon: Candyce GORMAN Reek, MD;  Location: [facility] INVASIVE CV LAB;  Service: Cardiovascular;  Laterality: N/A;   CHOLECYSTECTOMY  08/2000   CORONARY ANGIOPLASTY WITH STENT PLACEMENT  ~ 1999; ~ 2009 X 2; 12/05/2013   1 +3 + 2 + 2 ( total of 8 or 9 stents)    CORONARY ARTERY BYPASS GRAFT  1992   CABG X1   CYSTOSCOPY W/ URETERAL STENT PLACEMENT Right 09/12/2019   Procedure: CYSTOSCOPY WITH RETROGRADE PYELOGRAM/URETERAL STENT PLACEMENT;  Surgeon: Devere Lonni Righter, MD;  Location: [facility];  Service: Urology;  Laterality: Right;   CYSTOSCOPY/URETEROSCOPY/HOLMIUM LASER/STENT PLACEMENT Right 10/01/2019   Procedure: CYSTOSCOPY/URETEROSCOPY/HOLMIUM LASER/STENT PLACEMENT;  Surgeon: Devere Lonni Righter, MD;  Location: [facility];  Service: Urology;  Laterality: Right;   EXPLORATORY LAPAROTOMY WITH ABDOMINAL MASS EXCISION  08/2000   looking for cancer   INGUINAL HERNIA REPAIR     LEFT HEART CATH AND CORS/GRAFTS ANGIOGRAPHY N/A 12/16/2018   Procedure: LEFT HEART CATH AND CORS/GRAFTS ANGIOGRAPHY;  Surgeon: Wonda Sharper, MD;  Location: [facility] INVASIVE CV LAB;  Service: Cardiovascular;  Laterality: N/A;   LEFT HEART CATH AND CORS/GRAFTS ANGIOGRAPHY N/A 12/19/2019   Procedure: LEFT HEART CATH AND CORS/GRAFTS ANGIOGRAPHY;  Surgeon: Swaziland, Peter M, MD;  Location: [facility] INVASIVE CV LAB;  Service: Cardiovascular;  Laterality: N/A;   LEFT HEART CATHETERIZATION WITH CORONARY ANGIOGRAM N/A 12/05/2013   Procedure: LEFT HEART CATHETERIZATION WITH CORONARY ANGIOGRAM;  Surgeon: Sharper JONETTA Wonda, MD;  Location: [facility] CATH LAB;  Service: Cardiovascular;  Laterality: N/A;   PERCUTANEOUS CORONARY STENT INTERVENTION (PCI-S)  12/05/2013   Procedure: PERCUTANEOUS CORONARY STENT INTERVENTION (PCI-S);  Surgeon: Sharper JONETTA Wonda, MD;  Location: [facility] CATH LAB;  Service: Cardiovascular;;  mid circumflex and prox OM2   TONSILLECTOMY       Allergies  Allergen Reactions   Imdur  [Isosorbide  Nitrate] Diarrhea and Other (See Comments)    Headache, diarrhea   Ivp Dye [Iodinated Contrast Media] Other (See Comments)    headache      Family History  Problem Relation Age of Onset   Breast cancer Mother    Colon cancer Father      Social History Mr. Swaney reports  that he has never smoked. His smokeless tobacco use includes snuff. Mr. Eads reports that he does not currently use alcohol .   Physical Examination Today's Vitals   12/18/23 1553  BP: 114/72  Pulse: 73  SpO2: 99%  Weight: 218 lb (98.9 kg)  Height: 5' 11 (1.803 m)   Body mass index is 30.4 kg/m.  Gen: resting comfortably, no acute distress HEENT: no scleral icterus, pupils equal round and reactive, no palptable cervical adenopathy,  CV: RRR, no m/rg, no jvd Resp: Clear to auscultation bilaterally GI: abdomen is soft, non-tender, non-distended, normal bowel sounds, no hepatosplenomegaly  MSK: extremities are warm, no edema.  Skin: warm, no rash Neuro:  no focal deficits Psych: appropriate affect     Assessment and Plan   1. CAD - continued DAPT given extensive CAD history and disease burden.  - no recent symptoms, contiue current meds - EKG today shows SR, LAFB.      2. Hyperlipidemia - goal LDL would be <55 based on prior MI, multiple revascuarliazations, multiple risk factors, age.  - LDL has been at goal, request pcp most recent labs - continue current meds       Dorn PHEBE Ross, M.D.

## 2024-01-10 ENCOUNTER — Other Ambulatory Visit: Payer: Self-pay | Admitting: Cardiology

## 2024-03-05 DIAGNOSIS — R590 Localized enlarged lymph nodes: Secondary | ICD-10-CM | POA: Diagnosis not present

## 2024-03-05 DIAGNOSIS — Z0000 Encounter for general adult medical examination without abnormal findings: Secondary | ICD-10-CM | POA: Diagnosis not present

## 2024-03-05 DIAGNOSIS — R918 Other nonspecific abnormal finding of lung field: Secondary | ICD-10-CM | POA: Diagnosis not present

## 2024-03-05 DIAGNOSIS — R9389 Abnormal findings on diagnostic imaging of other specified body structures: Secondary | ICD-10-CM | POA: Diagnosis not present

## 2024-03-13 ENCOUNTER — Other Ambulatory Visit (HOSPITAL_COMMUNITY): Payer: Self-pay | Admitting: Family Medicine

## 2024-03-13 DIAGNOSIS — R9389 Abnormal findings on diagnostic imaging of other specified body structures: Secondary | ICD-10-CM

## 2024-03-13 DIAGNOSIS — R591 Generalized enlarged lymph nodes: Secondary | ICD-10-CM

## 2024-03-27 ENCOUNTER — Ambulatory Visit (HOSPITAL_COMMUNITY)
Admission: RE | Admit: 2024-03-27 | Discharge: 2024-03-27 | Disposition: A | Discharge status: Home / Self Care | Source: Ambulatory Visit | Attending: Family Medicine | Admitting: Family Medicine

## 2024-03-27 DIAGNOSIS — N200 Calculus of kidney: Secondary | ICD-10-CM | POA: Diagnosis not present

## 2024-03-27 DIAGNOSIS — R918 Other nonspecific abnormal finding of lung field: Secondary | ICD-10-CM | POA: Insufficient documentation

## 2024-03-27 DIAGNOSIS — I700 Atherosclerosis of aorta: Secondary | ICD-10-CM | POA: Insufficient documentation

## 2024-03-27 DIAGNOSIS — K760 Fatty (change of) liver, not elsewhere classified: Secondary | ICD-10-CM | POA: Insufficient documentation

## 2024-03-27 DIAGNOSIS — N400 Enlarged prostate without lower urinary tract symptoms: Secondary | ICD-10-CM | POA: Diagnosis not present

## 2024-03-27 DIAGNOSIS — Z8572 Personal history of non-Hodgkin lymphomas: Secondary | ICD-10-CM | POA: Insufficient documentation

## 2024-03-27 DIAGNOSIS — R591 Generalized enlarged lymph nodes: Secondary | ICD-10-CM | POA: Diagnosis not present

## 2024-03-27 DIAGNOSIS — R9389 Abnormal findings on diagnostic imaging of other specified body structures: Secondary | ICD-10-CM | POA: Diagnosis not present

## 2024-03-27 MED ORDER — FLUDEOXYGLUCOSE F - 18 (FDG) INJECTION
11.1700 | Freq: Once | INTRAVENOUS | Status: AC | PRN
  Administered 2024-03-27: 11.17 via INTRAVENOUS

## 2024-04-08 DIAGNOSIS — Z8572 Personal history of non-Hodgkin lymphomas: Secondary | ICD-10-CM | POA: Diagnosis not present

## 2024-04-08 DIAGNOSIS — Z955 Presence of coronary angioplasty implant and graft: Secondary | ICD-10-CM | POA: Diagnosis not present

## 2024-04-08 DIAGNOSIS — Z1329 Encounter for screening for other suspected endocrine disorder: Secondary | ICD-10-CM | POA: Diagnosis not present

## 2024-04-08 DIAGNOSIS — E78019 Familial hypercholesterolemia, unspecified: Secondary | ICD-10-CM | POA: Diagnosis not present

## 2024-04-08 DIAGNOSIS — E7849 Other hyperlipidemia: Secondary | ICD-10-CM | POA: Diagnosis not present

## 2024-04-08 DIAGNOSIS — E1169 Type 2 diabetes mellitus with other specified complication: Secondary | ICD-10-CM | POA: Diagnosis not present

## 2024-04-10 DIAGNOSIS — Z8572 Personal history of non-Hodgkin lymphomas: Secondary | ICD-10-CM | POA: Diagnosis not present

## 2024-04-10 DIAGNOSIS — I2510 Atherosclerotic heart disease of native coronary artery without angina pectoris: Secondary | ICD-10-CM | POA: Diagnosis not present

## 2024-04-10 DIAGNOSIS — Z23 Encounter for immunization: Secondary | ICD-10-CM | POA: Diagnosis not present

## 2024-04-10 DIAGNOSIS — R590 Localized enlarged lymph nodes: Secondary | ICD-10-CM | POA: Diagnosis not present

## 2024-04-10 DIAGNOSIS — I10 Essential (primary) hypertension: Secondary | ICD-10-CM | POA: Diagnosis not present

## 2024-04-10 DIAGNOSIS — D126 Benign neoplasm of colon, unspecified: Secondary | ICD-10-CM | POA: Diagnosis not present

## 2024-04-10 DIAGNOSIS — M6208 Separation of muscle (nontraumatic), other site: Secondary | ICD-10-CM | POA: Diagnosis not present

## 2024-04-10 DIAGNOSIS — Z6831 Body mass index (BMI) 31.0-31.9, adult: Secondary | ICD-10-CM | POA: Diagnosis not present

## 2024-04-10 DIAGNOSIS — E119 Type 2 diabetes mellitus without complications: Secondary | ICD-10-CM | POA: Diagnosis not present

## 2024-04-21 DIAGNOSIS — Z8572 Personal history of non-Hodgkin lymphomas: Secondary | ICD-10-CM | POA: Diagnosis not present

## 2024-04-21 DIAGNOSIS — Z6831 Body mass index (BMI) 31.0-31.9, adult: Secondary | ICD-10-CM | POA: Diagnosis not present

## 2024-04-21 DIAGNOSIS — R0602 Shortness of breath: Secondary | ICD-10-CM | POA: Diagnosis not present

## 2024-04-21 DIAGNOSIS — D4989 Neoplasm of unspecified behavior of other specified sites: Secondary | ICD-10-CM | POA: Diagnosis not present

## 2024-05-06 ENCOUNTER — Encounter: Payer: Self-pay | Admitting: Thoracic Surgery (Cardiothoracic Vascular Surgery)

## 2024-05-06 ENCOUNTER — Ambulatory Visit: Admitting: Thoracic Surgery (Cardiothoracic Vascular Surgery)

## 2024-05-06 VITALS — BP 123/80 | HR 70 | Resp 20 | Ht 71.0 in | Wt 224.0 lb

## 2024-05-06 DIAGNOSIS — J9859 Other diseases of mediastinum, not elsewhere classified: Secondary | ICD-10-CM

## 2024-05-06 NOTE — Progress Notes (Signed)
 PCP is Burdine, Elspeth BRAVO, MD Referring Provider is Lari Elspeth BRAVO, MD  Chief Complaint  Patient presents with   Mediastinal Mass    Surgical consult/ PET Scan 03/27/24/ Chest CT- [facility] 03/05/24    HPI: Ryan Vaughn is sent for consultation regarding mediastinal adenopathy.  Ryan Vaughn is a 73 year old man with a history of CAD, MI, CABG, multiple DES, type 2 diabetes, hypertension, hyperlipidemia, non-Hodgkin's lymphoma, and arthritis.  He is a non-smoker but has used smokeless tobacco.  He had non-Hodgkin's lymphoma treated with chemotherapy in 2002.  Was followed for years with no evidence of recurrence.  He used to work in a [employer].  Recently had a black lung test.  That led to a CT of the chest to evaluate pulmonary opacities.  The CT was done at [facility].  It showed borderline mediastinal and right hilar adenopathy.  PET/CT showed a 12 mm right paratracheal node with an SUV of 2.8 and a 1.5 cm subcarinal node with SUV of 3.7..  He denies any fevers, chills, or night sweats.  Has a chronic dry cough.  No change in appetite or weight loss.  Complains of an achy sensation in the lower chest/ upper abdomen, like when you had the flu.  Not similar to his prior cardiac symptoms.  Zubrod Score: At the time of surgery this patients most appropriate activity status/level should be described as: [x]     0    Normal activity, no symptoms []     1    Restricted in physical strenuous activity but ambulatory, able to do out light work []     2    Ambulatory and capable of self care, unable to do work activities, up and about >50 % of waking hours                              []     3    Only limited self care, in bed greater than 50% of waking hours []     4    Completely disabled, no self care, confined to bed or chair []     5    Moribund  Past Medical History:  Diagnosis Date   Arthritis    CAD (coronary artery disease)    a. DES to OM 1 and DES mLCX (12/05/13) b. CABG x1  ('92) c. 6  stents placed in 2009? (pt hx)    Diabetes mellitus without complication (HCC)    type 2   Essential hypertension, benign    Heart murmur    as a child    History of kidney stones    Lumbago    Mixed hyperlipidemia    Myocardial infarction [facility]) age 82   Non Hodgkin's lymphoma (HCC) 2002   a. stage IV, chemo done   Personal history of colonic polyps     Past Surgical History:  Procedure Laterality Date   CARDIAC CATHETERIZATION N/A 07/16/2015   Procedure: Left Heart Cath and Cors/Grafts Angiography;  Surgeon: Candyce GORMAN Reek, MD;  Location: [facility] INVASIVE CV LAB;  Service: Cardiovascular;  Laterality: N/A;   CARDIAC CATHETERIZATION N/A 07/16/2015   Procedure: Coronary Stent Intervention;  Surgeon: Candyce GORMAN Reek, MD;  Location: [facility] INVASIVE CV LAB;  Service: Cardiovascular;  Laterality: N/A;   CHOLECYSTECTOMY  08/2000   CORONARY ANGIOPLASTY WITH STENT PLACEMENT  ~ 1999; ~ 2009 X 2; 12/05/2013   1 +3 + 2 + 2 ( total of 8  or 9 stents)   CORONARY ARTERY BYPASS GRAFT  1992   CABG X1   CYSTOSCOPY W/ URETERAL STENT PLACEMENT Right 09/12/2019   Procedure: CYSTOSCOPY WITH RETROGRADE PYELOGRAM/URETERAL STENT PLACEMENT;  Surgeon: Devere Lonni Righter, MD;  Location: [facility];  Service: Urology;  Laterality: Right;   CYSTOSCOPY/URETEROSCOPY/HOLMIUM LASER/STENT PLACEMENT Right 10/01/2019   Procedure: CYSTOSCOPY/URETEROSCOPY/HOLMIUM LASER/STENT PLACEMENT;  Surgeon: Devere Lonni Righter, MD;  Location: [facility];  Service: Urology;  Laterality: Right;   EXPLORATORY LAPAROTOMY WITH ABDOMINAL MASS EXCISION  08/2000   looking for cancer   INGUINAL HERNIA REPAIR     LEFT HEART CATH AND CORS/GRAFTS ANGIOGRAPHY N/A 12/16/2018   Procedure: LEFT HEART CATH AND CORS/GRAFTS ANGIOGRAPHY;  Surgeon: Wonda Sharper, MD;  Location: [facility] INVASIVE CV LAB;  Service: Cardiovascular;  Laterality: N/A;   LEFT HEART CATH AND CORS/GRAFTS ANGIOGRAPHY N/A 12/19/2019   Procedure: LEFT HEART  CATH AND CORS/GRAFTS ANGIOGRAPHY;  Surgeon: Jordan, Peter M, MD;  Location: [facility] INVASIVE CV LAB;  Service: Cardiovascular;  Laterality: N/A;   LEFT HEART CATHETERIZATION WITH CORONARY ANGIOGRAM N/A 12/05/2013   Procedure: LEFT HEART CATHETERIZATION WITH CORONARY ANGIOGRAM;  Surgeon: Sharper JONETTA Wonda, MD;  Location: [facility] CATH LAB;  Service: Cardiovascular;  Laterality: N/A;   PERCUTANEOUS CORONARY STENT INTERVENTION (PCI-S)  12/05/2013   Procedure: PERCUTANEOUS CORONARY STENT INTERVENTION (PCI-S);  Surgeon: Sharper JONETTA Wonda, MD;  Location: [facility] CATH LAB;  Service: Cardiovascular;;  mid circumflex and prox OM2   TONSILLECTOMY      Family History  Problem Relation Age of Onset   Breast cancer Mother    Colon cancer Father     Social History Social History[1]  Current Outpatient Medications  Medication Sig Dispense Refill   aspirin  EC 81 MG EC tablet Take 1 tablet (81 mg total) by mouth daily. Swallow whole. 30 tablet 11   clopidogrel  (PLAVIX ) 75 MG tablet TAKE 1 TABLET(75 MG) BY MOUTH DAILY 90 tablet 3   ezetimibe  (ZETIA ) 10 MG tablet Take 1 tablet (10 mg total) by mouth daily. 30 tablet 4   metFORMIN (GLUCOPHAGE) 500 MG tablet Take 500 mg by mouth 2 (two) times daily with a meal.     metoprolol  succinate (TOPROL -XL) 50 MG 24 hr tablet Take 50 mg by mouth daily.     Multiple Vitamin (MULTIVITAMIN ADULT PO) Take by mouth.     nitroGLYCERIN  (NITROSTAT ) 0.4 MG SL tablet Place 1 tablet (0.4 mg total) under the tongue every 5 (five) minutes x 3 doses as needed for chest pain (if no relief after 2nd dose, proceed to the ED for an evaluation or call 911). 25 tablet 3   ranolazine  (RANEXA ) 500 MG 12 hr tablet Take 1 tablet (500 mg total) by mouth 2 (two) times daily. 60 tablet 4   rosuvastatin  (CRESTOR ) 40 MG tablet TAKE 1 TABLET(40 MG) BY MOUTH DAILY 90 tablet 1   triamcinolone  cream (KENALOG ) 0.1 % Apply 1 Application topically 2 (two) times daily.     No current facility-administered medications for  this visit.    Allergies[2]  Review of Systems  Constitutional:  Negative for activity change, appetite change, chills, diaphoresis, fever and unexpected weight change.  HENT:  Positive for dental problem and hearing loss. Negative for trouble swallowing and voice change.   Eyes:  Negative for visual disturbance.  Respiratory:  Positive for cough. Negative for shortness of breath.   Cardiovascular:  Negative for chest pain, palpitations and leg swelling.  Genitourinary:  Negative for difficulty urinating and dysuria.  Musculoskeletal:  Positive for arthralgias and joint swelling.  Neurological:  Negative for seizures and weakness.  Hematological:  Negative for adenopathy. Does not bruise/bleed easily.  All other systems reviewed and are negative.   BP 123/80   Pulse 70   Resp 20   Ht 5' 11 (1.803 m)   Wt 224 lb (101.6 kg)   SpO2 97% Comment: RA  BMI 31.24 kg/m  Physical Exam Vitals reviewed.  Constitutional:      General: He is not in acute distress.    Appearance: Normal appearance.  HENT:     Head: Normocephalic and atraumatic.  Eyes:     General: No scleral icterus.    Extraocular Movements: Extraocular movements intact.  Cardiovascular:     Rate and Rhythm: Normal rate and regular rhythm.     Heart sounds: Normal heart sounds. No murmur heard.    No friction rub. No gallop.  Pulmonary:     Effort: Pulmonary effort is normal. No respiratory distress.     Breath sounds: Normal breath sounds. No wheezing or rales.  Abdominal:     General: There is no distension.     Palpations: Abdomen is soft.     Tenderness: There is no abdominal tenderness.  Lymphadenopathy:     Cervical: No cervical adenopathy.  Skin:    General: Skin is warm and dry.  Neurological:     General: No focal deficit present.     Mental Status: He is alert and oriented to person, place, and time.     Cranial Nerves: No cranial nerve deficit.     Motor: No weakness.     Diagnostic  Tests: PET AND CT SKULL BASE TO MID THIGH 03/27/2024 11:32:42 AM   TECHNIQUE:   RADIOPHARMACEUTICAL: 11.17 mCi F-18 FDG Uptake time 60 minutes. Glucose level 138 mg/dl.   PET imaging was acquired from the base of the skull to the mid thighs. Non-contrast enhanced computed tomography was obtained for attenuation correction and anatomic localization.   COMPARISON: Outside CT report of 03/05/2024.   CLINICAL HISTORY: Clinical history of lymphoma. Thoracoabdominal adenopathy on outside chest CT.   FINDINGS:   HEAD AND NECK: No hypermetabolic cervical lymph nodes are identified.   CHEST: A right paratracheal node measures 12 mm suv 2.8 on image 59 / 202. Subcarinal node measures 1.5 cm and SUV 3.7 on image 62 / 202. No hilar or axillary nodal hypermetabolism. No pulmonary parenchymal hypermetabolism identified. Scattered pulmonary nodules on the order of 2 to 3 mm and less are below PET resolution. Median sternotomy for prior CBG. Thoracic aortic atherosclerosis.   ABDOMEN AND PELVIS: There is no hypermetabolic activity within the liver, adrenal glands, spleen or pancreas. No abdominopelvic nodal hypermetabolism. Cholecystectomy. Mild hepatic steatosis. Normal adrenal glands. Right interpolar 1.0 cm renal collecting system calculus. Scattered colonic diverticula. Small fat containing left inguinal hernia.   BONES AND SOFT TISSUE: There is no hypermetabolic activity to suggest osseous metastatic disease. Degenerative sclerosis of the symphysis pubis.   IMPRESSION: 1. Mildly enlarged mediastinal nodes with mild hypermetabolism, slightly greater than blood pool. Indeterminate, especially given the history of lymphoma. Potential clinical strategies include CT follow up at 3 to 6 months or tissue sampling. 2. Otherwise, no evidence of active lymphoma. 3. Incidental findings, including: Aortic atherosclerosis ([id number]). Nonspecific tiny pulmonary nodules. Hepatic  steatosis.right nephrolithiasis.moderate prostatomegaly.   Electronically signed by: Rockey Kilts MD 03/30/2024 05:59 PM EST RP Workstation: [id number]   I personally reviewed the PET CT images as well  as the CT images from [facility] which Ryan Vaughn brought with him on a disk. Borderline enlarged 4R and 7 lymph nodes with mild hypermetabolism.  No dominant lung nodule.  Multiple tiny pulmonary nodules.  Aortic and coronary atherosclerosis.  Impression:  Ryan Vaughn is a 73 year old man with a history of CAD, MI, CABG, multiple DES, type 2 diabetes, hypertension, hyperlipidemia, non-Hodgkin's lymphoma, and arthritis.  He is a non-smoker but has used smokeless tobacco.  Recently found to have borderline mediastinal adenopathy that is mildly hypermetabolic on PET/CT.  Mediastinal adenopathy-borderline enlarged and only mild uptake on PET.  Differential diagnosis includes lymphoproliferative disorder, metastatic cancer, sinus histiocytosis, and reactive adenopathy.  Would be extremely minimal for a lymphoproliferative disorder.  We discussed options including continued radiographic observation versus biopsy.  Biopsy would require mediastinoscopy.  Although generally safe there is potential risk for major complications with that procedure.  I am not sure there is enough on the scans to warrant that currently.  My recommendation was that we repeat a scan in a couple of months.  That will be 3 months from his most recent scan.  If the nodes are enlarging then mediastinoscopy would be appropriate.  If stable or smaller could be monitored.  Plan: Return in 2 months (3 months from previous scan) with new CT to follow-up mediastinal adenopathy.  Elspeth JAYSON Millers, MD [facility] [phone]     [1]  Social History Tobacco Use   Smoking status: Never   Smokeless tobacco: Current    Types: Snuff   Tobacco comments:    12/05/2013 use snuff q now and again  Vaping Use    Vaping status: Never Used  Substance Use Topics   Alcohol  use: Not Currently    Alcohol /week: 0.0 standard drinks of alcohol    Drug use: No  [2]  Allergies Allergen Reactions   Imdur  [Isosorbide  Nitrate] Diarrhea and Other (See Comments)    Headache, diarrhea   Ivp Dye [Iodinated Contrast Media] Other (See Comments)    headache

## 2024-05-27 ENCOUNTER — Other Ambulatory Visit: Payer: Self-pay | Admitting: Cardiology

## 2024-05-28 ENCOUNTER — Other Ambulatory Visit: Payer: Self-pay | Admitting: Thoracic Surgery (Cardiothoracic Vascular Surgery)

## 2024-05-28 DIAGNOSIS — J9859 Other diseases of mediastinum, not elsewhere classified: Secondary | ICD-10-CM

## 2024-06-17 ENCOUNTER — Ambulatory Visit (HOSPITAL_COMMUNITY)
Admission: RE | Admit: 2024-06-17 | Discharge: 2024-06-17 | Disposition: A | Discharge status: Home / Self Care | Source: Ambulatory Visit

## 2024-06-17 DIAGNOSIS — J9859 Other diseases of mediastinum, not elsewhere classified: Secondary | ICD-10-CM | POA: Diagnosis present

## 2024-06-17 LAB — POCT I-STAT CREATININE: Creatinine, Ser: 1.2 mg/dL (ref 0.61–1.24)

## 2024-06-17 MED ORDER — IOHEXOL 350 MG/ML SOLN
50.0000 mL | Freq: Once | INTRAVENOUS | Status: AC | PRN
  Administered 2024-06-17: 50 mL via INTRAVENOUS

## 2024-07-01 ENCOUNTER — Ambulatory Visit: Admitting: Thoracic Surgery (Cardiothoracic Vascular Surgery)
